# Patient Record
Sex: Female | Born: 1965
Health system: Southern US, Community
[De-identification: ages and names within clinical notes are randomized; demographics above are authoritative.]

## PROBLEM LIST (undated history)

## (undated) DIAGNOSIS — Z940 Kidney transplant status: Secondary | ICD-10-CM

## (undated) DIAGNOSIS — D631 Anemia in chronic kidney disease: Secondary | ICD-10-CM

## (undated) DIAGNOSIS — N185 Chronic kidney disease, stage 5: Secondary | ICD-10-CM

## (undated) DIAGNOSIS — N189 Chronic kidney disease, unspecified: Secondary | ICD-10-CM

## (undated) DIAGNOSIS — I10 Essential (primary) hypertension: Secondary | ICD-10-CM

## (undated) DIAGNOSIS — IMO0001 Reserved for inherently not codable concepts without codable children: Secondary | ICD-10-CM

## (undated) DIAGNOSIS — D649 Anemia, unspecified: Secondary | ICD-10-CM

## (undated) DIAGNOSIS — N183 Chronic kidney disease, stage 3 (moderate): Secondary | ICD-10-CM

## (undated) HISTORY — DX: Chronic kidney disease, stage 5: N18.5

## (undated) HISTORY — DX: Anemia in chronic kidney disease: N18.9

## (undated) HISTORY — PX: CATARACT EXTRACTION W/ INTRAOCULAR LENS IMPLANT: SHX1309

## (undated) HISTORY — DX: Anemia in chronic kidney disease: D63.1

---

## 2003-05-11 HISTORY — PX: TUBAL LIGATION: SHX77

## 2007-05-11 HISTORY — PX: THYROIDECTOMY: SHX17

## 2013-03-01 ENCOUNTER — Encounter (HOSPITAL_COMMUNITY): Payer: Self-pay | Admitting: Emergency Medicine

## 2013-03-01 ENCOUNTER — Emergency Department (HOSPITAL_COMMUNITY)
Admission: EM | Admit: 2013-03-01 | Discharge: 2013-03-01 | Disposition: A | Discharge status: Home / Self Care | Payer: Medicaid Other | Source: Home / Self Care | Attending: Family Medicine | Admitting: Family Medicine

## 2013-03-01 DIAGNOSIS — I10 Essential (primary) hypertension: Secondary | ICD-10-CM

## 2013-03-01 DIAGNOSIS — Z23 Encounter for immunization: Secondary | ICD-10-CM

## 2013-03-01 DIAGNOSIS — E1165 Type 2 diabetes mellitus with hyperglycemia: Secondary | ICD-10-CM

## 2013-03-01 DIAGNOSIS — IMO0001 Reserved for inherently not codable concepts without codable children: Secondary | ICD-10-CM

## 2013-03-01 DIAGNOSIS — N183 Chronic kidney disease, stage 3 unspecified: Secondary | ICD-10-CM

## 2013-03-01 DIAGNOSIS — IMO0002 Reserved for concepts with insufficient information to code with codable children: Secondary | ICD-10-CM

## 2013-03-01 HISTORY — DX: Reserved for inherently not codable concepts without codable children: IMO0001

## 2013-03-01 HISTORY — DX: Essential (primary) hypertension: I10

## 2013-03-01 HISTORY — DX: Chronic kidney disease, stage 3 (moderate): N18.3

## 2013-03-01 LAB — POCT I-STAT, CHEM 8
BUN: 33 mg/dL — ABNORMAL HIGH (ref 6–23)
Calcium, Ion: 1.17 mmol/L (ref 1.12–1.23)
Chloride: 107 mEq/L (ref 96–112)
HCT: 31 % — ABNORMAL LOW (ref 36.0–46.0)
Potassium: 4.7 mEq/L (ref 3.5–5.1)
Sodium: 139 mEq/L (ref 135–145)

## 2013-03-01 LAB — RENAL FUNCTION PANEL
CO2: 24 mEq/L (ref 19–32)
Calcium: 8.5 mg/dL (ref 8.4–10.5)
Chloride: 104 mEq/L (ref 96–112)
GFR calc Af Amer: 25 mL/min — ABNORMAL LOW (ref >90)
GFR calc non Af Amer: 22 mL/min — ABNORMAL LOW (ref >90)
Glucose, Bld: 209 mg/dL — ABNORMAL HIGH (ref 70–99)
Potassium: 5.1 mEq/L (ref 3.5–5.1)
Sodium: 138 mEq/L (ref 135–145)

## 2013-03-01 MED ORDER — GLIPIZIDE 5 MG PO TABS: 5.0000 mg | ORAL_TABLET | Freq: Two times a day (BID) | ORAL | Status: DC

## 2013-03-01 MED ORDER — INFLUENZA VAC SPLIT QUAD 0.5 ML IM SUSP
0.5000 mL | INTRAMUSCULAR | Status: AC
  Administered 2013-03-01: 0.5 mL via INTRAMUSCULAR

## 2013-03-01 MED ORDER — AMLODIPINE BESYLATE 10 MG PO TABS: 10.0000 mg | ORAL_TABLET | Freq: Every day | ORAL | Status: DC

## 2013-03-01 MED ORDER — METOPROLOL TARTRATE 25 MG PO TABS: 25.0000 mg | ORAL_TABLET | Freq: Two times a day (BID) | ORAL | Status: DC

## 2013-03-01 MED ORDER — LISINOPRIL 5 MG PO TABS: 5.0000 mg | ORAL_TABLET | Freq: Every day | ORAL | Status: DC

## 2013-03-01 MED ORDER — FUROSEMIDE 20 MG PO TABS: 20.0000 mg | ORAL_TABLET | Freq: Two times a day (BID) | ORAL | Status: DC

## 2013-03-01 NOTE — ED Notes (Signed)
Pt is here for HTN  BP today is 199/74 Denies: CP, HA, SOB, weakness, blurry vision, edema Needing refills on meds... appt w/PCP next month.  Alert w/no signs of acute distress.

## 2013-03-01 NOTE — ED Provider Notes (Signed)
Samantha [name] is a 47 y.o. female who presents to Urgent Care today for elevated blood pressure. Patient has a medical history significant for hypertension and stage III chronic kidney disease and uncontrolled diabetes. She was controlled with lisinopril, Lasix, hydralazine. She's run out of her medications recently. 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  She denies any chest pains palpitations or shortness of breath. She feels well otherwise. Additionally she would like a flu vaccination today if possible.   Past Medical History  Diagnosis Date  . Hypertension   . Uncontrolled diabetes mellitus with microalbuminuria or microproteinuria   . Chronic kidney disease (CKD), stage III (moderate)    History  Substance Use Topics  . Smoking status: Never Smoker   . Smokeless tobacco: Not on file  . Alcohol Use: No   ROS as above Medications reviewed. No current facility-administered medications for this encounter.   Current Outpatient Prescriptions  Medication Sig Dispense Refill  . hydrALAZINE (APRESOLINE) 25 MG tablet Take 25 mg by mouth 3 (three) times daily.      Marland Kitchen amLODipine (NORVASC) 10 MG tablet Take 1 tablet (10 mg total) by mouth daily.  30 tablet  0  . furosemide (LASIX) 20 MG tablet Take 1 tablet (20 mg total) by mouth 2 (two) times daily.  60 tablet  0  . glipiZIDE (GLUCOTROL) 5 MG tablet Take 1 tablet (5 mg total) by mouth 2 (two) times daily before a meal.  60 tablet  0  . lisinopril (PRINIVIL,ZESTRIL) 5 MG tablet Take 1 tablet (5 mg total) by mouth daily.  30 tablet  0  . metoprolol tartrate (LOPRESSOR) 25 MG tablet Take 1 tablet (25 mg total) by mouth 2 (two) times daily.  60 tablet  0    Exam:  BP 199/74  Pulse 91  Temp(Src) 97.8 F (36.6 C) (Oral)  Resp 12  SpO2 98%  LMP 02/25/2013 Gen: Well NAD HEENT: EOMI,  MMM Lungs: CTABL Nl WOB Heart: RRR no MRG Abd: NABS,  NT, ND Exts: Non edematous BL  LE, warm and well perfused.   Results for orders placed during the hospital encounter of 03/01/13 (from the past 24 hour(s))  POCT I-STAT, CHEM 8     Status: Abnormal   Collection Time    03/01/13 12:06 PM      Result Value Range   Sodium 139  135 - 145 mEq/L   Potassium 4.7  3.5 - 5.1 mEq/L   Chloride 107  96 - 112 mEq/L   BUN 33 (*) 6 - 23 mg/dL   Creatinine, Ser 2.70 (*) 0.50 - 1.10 mg/dL   Glucose, Bld 222 (*) 70 - 99 mg/dL   Calcium, Ion 1.17  1.12 - 1.23 mmol/L   TCO2 22  0 - 100 mmol/L   Hemoglobin 10.5 (*) 12.0 - 15.0 g/dL   HCT 31.0 (*) 36.0 - 46.0 %   No results found.  Assessment and Plan: 47 y.o. female with  1) kidney disease. I called [facility] in [place] and obtain laboratory results. Her creatinine was 2.2 in January of 2013. Her elevated creatinine is continuation of chronic kidney disease as she is completely asymptomatic. Plan: Renal function panel is pending. Additionally will discontinue lisinopril 20 and start lisinopril 5 daily as well as blood pressure control as below. She'll return to clinic in 3 days for repeat labs and repeat blood pressure.  2) uncontrolled hypertension: Plan to control hypertension with continued hydralazine, metoprolol 25 mg twice daily, amlodipine 10, and lisinopril 5. Patient will return to clinic as outlined above. 3) uncontrolled diabetes: Hemoglobin A1c is pending 4) flu vaccination given  Discussed warning signs or symptoms. Please see discharge instructions. Patient expresses understanding.      Gregor Hams, MD 03/01/13 [phone]

## 2013-03-02 LAB — HEMOGLOBIN A1C
Hgb A1c MFr Bld: 10.1 % — ABNORMAL HIGH
Mean Plasma Glucose: 243 mg/dL — ABNORMAL HIGH

## 2013-03-04 ENCOUNTER — Emergency Department (INDEPENDENT_AMBULATORY_CARE_PROVIDER_SITE_OTHER)
Admission: EM | Admit: 2013-03-04 | Discharge: 2013-03-04 | Disposition: A | Discharge status: Home / Self Care | Payer: Medicaid Other | Source: Home / Self Care | Attending: Family Medicine | Admitting: Family Medicine

## 2013-03-04 ENCOUNTER — Encounter (HOSPITAL_COMMUNITY): Payer: Self-pay | Admitting: Emergency Medicine

## 2013-03-04 DIAGNOSIS — IMO0001 Reserved for inherently not codable concepts without codable children: Secondary | ICD-10-CM

## 2013-03-04 DIAGNOSIS — R739 Hyperglycemia, unspecified: Secondary | ICD-10-CM

## 2013-03-04 DIAGNOSIS — N183 Chronic kidney disease, stage 3 unspecified: Secondary | ICD-10-CM

## 2013-03-04 DIAGNOSIS — R7309 Other abnormal glucose: Secondary | ICD-10-CM

## 2013-03-04 DIAGNOSIS — I10 Essential (primary) hypertension: Secondary | ICD-10-CM

## 2013-03-04 DIAGNOSIS — Z23 Encounter for immunization: Secondary | ICD-10-CM

## 2013-03-04 LAB — POCT I-STAT, CHEM 8
BUN: 39 mg/dL — ABNORMAL HIGH (ref 6–23)
Creatinine, Ser: 2.5 mg/dL — ABNORMAL HIGH (ref 0.50–1.10)
HCT: 30 % — ABNORMAL LOW (ref 36.0–46.0)
Hemoglobin: 10.2 g/dL — ABNORMAL LOW (ref 12.0–15.0)
Potassium: 4.6 mEq/L (ref 3.5–5.1)
Sodium: 138 mEq/L (ref 135–145)
TCO2: 23 mmol/L (ref 0–100)

## 2013-03-04 MED ORDER — GLIPIZIDE 10 MG PO TABS: ORAL_TABLET | ORAL | Status: DC

## 2013-03-04 NOTE — ED Provider Notes (Signed)
CSN: [id number]     Arrival date & time 03/04/13  0911 History   First MD Initiated Contact with Patient 03/04/13 1001     Chief Complaint  Patient presents with  . Hypertension   (Consider location/radiation/quality/duration/timing/severity/associated sxs/prior Treatment) Patient is a 47 y.o. female presenting with hypertension. The history is provided by the patient.  Hypertension This is a new problem. The problem occurs constantly. Nothing aggravates the symptoms. Nothing relieves the symptoms. She has tried nothing for the symptoms.  Pt here for recheck of blood pressure.   Pt advised to follow up today for recheck of blood work  Past Medical History  Diagnosis Date  . Hypertension   . Uncontrolled diabetes mellitus with microalbuminuria or microproteinuria   . Chronic kidney disease (CKD), stage III (moderate)    Past Surgical History  Procedure Laterality Date  . Cesarean section    . Thyroidectomy     History reviewed. No pertinent family history. History  Substance Use Topics  . Smoking status: Never Smoker   . Smokeless tobacco: Not on file  . Alcohol Use: No   OB History   Grav Para Term Preterm Abortions TAB SAB Ect Mult Living                 Review of Systems  All other systems reviewed and are negative.    Allergies  Review of patient's allergies indicates no known allergies.  Home Medications   Current Outpatient Rx  Name  Route  Sig  Dispense  Refill  . amLODipine (NORVASC) 10 MG tablet   Oral   Take 1 tablet (10 mg total) by mouth daily.   30 tablet   0   . furosemide (LASIX) 20 MG tablet   Oral   Take 1 tablet (20 mg total) by mouth 2 (two) times daily.   60 tablet   0   . hydrALAZINE (APRESOLINE) 25 MG tablet   Oral   Take 25 mg by mouth 3 (three) times daily.         Marland Kitchen lisinopril (PRINIVIL,ZESTRIL) 5 MG tablet   Oral   Take 1 tablet (5 mg total) by mouth daily.   30 tablet   0   . metoprolol tartrate (LOPRESSOR) 25 MG  tablet   Oral   Take 1 tablet (25 mg total) by mouth 2 (two) times daily.   60 tablet   0   . glipiZIDE (GLUCOTROL) 10 MG tablet      Take one tablet bid   60 tablet   0    BP 171/74  Pulse 68  Temp(Src) 98 F (36.7 C) (Oral)  Resp 18  SpO2 100%  LMP 02/25/2013 Physical Exam  Constitutional: She is oriented to person, place, and time. She appears well-developed and well-nourished.  HENT:  Head: Normocephalic and atraumatic.  Eyes: Pupils are equal, round, and reactive to light.  Neck: Normal range of motion.  Cardiovascular: Normal rate.   Pulmonary/Chest: Effort normal.  Abdominal: Soft.  Musculoskeletal: Normal range of motion.  Neurological: She is alert and oriented to person, place, and time. She has normal reflexes.  Skin: Skin is warm.  Psychiatric: She has a normal mood and affect.    ED Course  Procedures (including critical care time) Labs Review Labs Reviewed  POCT I-STAT, CHEM 8 - Abnormal; Notable for the following:    BUN 39 (*)    Creatinine, Ser 2.50 (*)    Glucose, Bld 215 (*)    Hemoglobin  10.2 (*)    HCT 30.0 (*)    All other components within normal limits   Imaging Review No results found.  EKG Interpretation     Ventricular Rate:    PR Interval:    QRS Duration:   QT Interval:    QTC Calculation:   R Axis:     Text Interpretation:              MDM   1. Hypertension   2. Hyperglycemia     Pt advised to follow up with [facility] nephrology.  Increase glipizide to 10 mg bid.  Return for bp recheck in 3 weeks  Fransico Meadow, Hershal Coria 03/04/13 1144  Medical screening examination/treatment/procedure(s) were performed by a resident physician or non-physician practitioner and as the supervising physician I was immediately available for consultation/collaboration.  Lynne Leader, MD    Gregor Hams, MD 03/04/13 [phone]

## 2013-03-04 NOTE — ED Notes (Signed)
C/o hypertension.  Patient has started new medication; Norvasc, metoprolol

## 2014-08-26 DIAGNOSIS — D638 Anemia in other chronic diseases classified elsewhere: Secondary | ICD-10-CM | POA: Diagnosis present

## 2014-10-10 ENCOUNTER — Other Ambulatory Visit: Payer: Self-pay | Admitting: Obstetrics & Gynecology

## 2014-10-15 ENCOUNTER — Encounter (HOSPITAL_COMMUNITY)
Admission: RE | Admit: 2014-10-15 | Discharge: 2014-10-15 | Disposition: A | Discharge status: Home / Self Care | Payer: Medicaid Other | Source: Ambulatory Visit | Attending: Obstetrics & Gynecology | Admitting: Obstetrics & Gynecology

## 2014-10-15 ENCOUNTER — Encounter (HOSPITAL_COMMUNITY): Payer: Self-pay

## 2014-10-15 ENCOUNTER — Other Ambulatory Visit: Payer: Self-pay

## 2014-10-15 DIAGNOSIS — Z01818 Encounter for other preprocedural examination: Secondary | ICD-10-CM | POA: Insufficient documentation

## 2014-10-15 HISTORY — DX: Anemia, unspecified: D64.9

## 2014-10-15 LAB — CBC
HCT: 32.3 % — ABNORMAL LOW (ref 36.0–46.0)
Hemoglobin: 10.3 g/dL — ABNORMAL LOW (ref 12.0–15.0)
MCH: 27.7 pg (ref 26.0–34.0)
MCHC: 31.9 g/dL (ref 30.0–36.0)
MCV: 86.8 fL (ref 78.0–100.0)
Platelets: 351 10*3/uL (ref 150–400)
RBC: 3.72 MIL/uL — ABNORMAL LOW (ref 3.87–5.11)
RDW: 13.2 % (ref 11.5–15.5)
WBC: 8.2 10*3/uL (ref 4.0–10.5)

## 2014-10-15 LAB — BASIC METABOLIC PANEL
ANION GAP: 4 — AB (ref 5–15)
BUN: 65 mg/dL — ABNORMAL HIGH (ref 6–20)
CALCIUM: 8.4 mg/dL — AB (ref 8.9–10.3)
CHLORIDE: 111 mmol/L (ref 101–111)
CO2: 22 mmol/L (ref 22–32)
Creatinine, Ser: 2.75 mg/dL — ABNORMAL HIGH (ref 0.44–1.00)
GFR calc Af Amer: 22 mL/min — ABNORMAL LOW (ref >60)
GFR calc non Af Amer: 19 mL/min — ABNORMAL LOW (ref >60)
GLUCOSE: 135 mg/dL — AB (ref 65–99)
POTASSIUM: 5.1 mmol/L (ref 3.5–5.1)
SODIUM: 137 mmol/L (ref 135–145)

## 2014-10-15 LAB — TYPE AND SCREEN
ABO/RH(D): O POS
Antibody Screen: NEGATIVE

## 2014-10-15 LAB — ABO/RH: ABO/RH(D): O POS

## 2014-10-15 NOTE — Patient Instructions (Signed)
Your procedure is scheduled on:10/24/14  Enter through the Main Entrance at :10 am  Pick up desk phone and dial [phone] and inform us of your arrival.  Please call [phone] if you have any problems the morning of surgery.  Remember: Do not eat food after midnight:Wed. Clear liquids are ok until:7am on Thursday   You may brush your teeth the morning of surgery.  Take these meds the morning of surgery with a sip of water :Blood pressure meds  DO NOT TAKE VICTOZA ON MORNING OF SURGERY.  DO NOT wear jewelry, eye make-up, lipstick,body lotion, or dark fingernail polish.  (Polished toes are ok) You may wear deodorant.  If you are to be admitted after surgery, leave suitcase in car until your room has been assigned. Patients discharged on the day of surgery will not be allowed to drive home. Wear loose fitting, comfortable clothes for your ride home.

## 2014-10-15 NOTE — Pre-Procedure Instructions (Signed)
Per Dr. Tresa Moore, have pt hold Victoza on am of surgery.

## 2014-10-24 ENCOUNTER — Ambulatory Visit (HOSPITAL_COMMUNITY): Payer: Medicaid Other | Admitting: Anesthesiology

## 2014-10-24 ENCOUNTER — Ambulatory Visit (HOSPITAL_COMMUNITY)
Admission: RE | Admit: 2014-10-24 | Discharge: 2014-10-24 | Disposition: A | Discharge status: Home / Self Care | Payer: Medicaid Other | Source: Ambulatory Visit | Attending: Obstetrics & Gynecology | Admitting: Obstetrics & Gynecology

## 2014-10-24 ENCOUNTER — Encounter (HOSPITAL_COMMUNITY)
Admission: RE | Disposition: A | Discharge status: Home / Self Care | Payer: Self-pay | Source: Ambulatory Visit | Attending: Obstetrics & Gynecology

## 2014-10-24 DIAGNOSIS — N840 Polyp of corpus uteri: Secondary | ICD-10-CM | POA: Diagnosis not present

## 2014-10-24 DIAGNOSIS — N183 Chronic kidney disease, stage 3 (moderate): Secondary | ICD-10-CM | POA: Insufficient documentation

## 2014-10-24 DIAGNOSIS — E1165 Type 2 diabetes mellitus with hyperglycemia: Secondary | ICD-10-CM | POA: Diagnosis not present

## 2014-10-24 DIAGNOSIS — I129 Hypertensive chronic kidney disease with stage 1 through stage 4 chronic kidney disease, or unspecified chronic kidney disease: Secondary | ICD-10-CM | POA: Diagnosis not present

## 2014-10-24 DIAGNOSIS — Z9851 Tubal ligation status: Secondary | ICD-10-CM | POA: Insufficient documentation

## 2014-10-24 DIAGNOSIS — N939 Abnormal uterine and vaginal bleeding, unspecified: Secondary | ICD-10-CM | POA: Diagnosis present

## 2014-10-24 DIAGNOSIS — D649 Anemia, unspecified: Secondary | ICD-10-CM | POA: Insufficient documentation

## 2014-10-24 DIAGNOSIS — Z79899 Other long term (current) drug therapy: Secondary | ICD-10-CM | POA: Insufficient documentation

## 2014-10-24 HISTORY — PX: DILITATION & CURRETTAGE/HYSTROSCOPY WITH NOVASURE ABLATION: SHX5568

## 2014-10-24 LAB — PREGNANCY, URINE: Preg Test, Ur: NEGATIVE

## 2014-10-24 LAB — GLUCOSE, CAPILLARY
Glucose-Capillary: 153 mg/dL — ABNORMAL HIGH (ref 65–99)
Glucose-Capillary: 158 mg/dL — ABNORMAL HIGH (ref 65–99)

## 2014-10-24 SURGERY — DILATATION & CURETTAGE/HYSTEROSCOPY WITH NOVASURE ABLATION
Anesthesia: General | Site: Vagina

## 2014-10-24 MED ORDER — PHENYLEPHRINE HCL 10 MG/ML IJ SOLN
INTRAMUSCULAR | Status: DC | PRN
  Administered 2014-10-24 (×5): 40 ug via INTRAVENOUS

## 2014-10-24 MED ORDER — FENTANYL CITRATE (PF) 250 MCG/5ML IJ SOLN
INTRAMUSCULAR | Status: AC
  Filled 2014-10-24: qty 5

## 2014-10-24 MED ORDER — MIDAZOLAM HCL 2 MG/2ML IJ SOLN
INTRAMUSCULAR | Status: AC
  Filled 2014-10-24: qty 2

## 2014-10-24 MED ORDER — PROMETHAZINE HCL 25 MG/ML IJ SOLN
INTRAMUSCULAR | Status: AC
  Filled 2014-10-24: qty 1

## 2014-10-24 MED ORDER — MIDAZOLAM HCL 2 MG/2ML IJ SOLN
INTRAMUSCULAR | Status: DC | PRN
  Administered 2014-10-24: 2 mg via INTRAVENOUS

## 2014-10-24 MED ORDER — ONDANSETRON HCL 4 MG/2ML IJ SOLN
INTRAMUSCULAR | Status: AC
  Filled 2014-10-24: qty 2

## 2014-10-24 MED ORDER — ACETAMINOPHEN 160 MG/5ML PO SOLN
ORAL | Status: AC
  Filled 2014-10-24: qty 20.3

## 2014-10-24 MED ORDER — SCOPOLAMINE 1 MG/3DAYS TD PT72
MEDICATED_PATCH | TRANSDERMAL | Status: AC
  Administered 2014-10-24: 1.5 mg via TRANSDERMAL
  Filled 2014-10-24: qty 1

## 2014-10-24 MED ORDER — ACETAMINOPHEN 160 MG/5ML PO SOLN
975.0000 mg | Freq: Once | ORAL | Status: AC
  Administered 2014-10-24: 975 mg via ORAL

## 2014-10-24 MED ORDER — LIDOCAINE HCL 1 % IJ SOLN
INTRAMUSCULAR | Status: AC
  Filled 2014-10-24: qty 20

## 2014-10-24 MED ORDER — PROMETHAZINE HCL 25 MG/ML IJ SOLN
12.5000 mg | Freq: Four times a day (QID) | INTRAMUSCULAR | Status: DC | PRN
  Administered 2014-10-24: 12.5 mg via INTRAVENOUS

## 2014-10-24 MED ORDER — PHENYLEPHRINE 40 MCG/ML (10ML) SYRINGE FOR IV PUSH (FOR BLOOD PRESSURE SUPPORT)
PREFILLED_SYRINGE | INTRAVENOUS | Status: AC
  Filled 2014-10-24: qty 10

## 2014-10-24 MED ORDER — LIDOCAINE HCL (CARDIAC) 20 MG/ML IV SOLN
INTRAVENOUS | Status: DC | PRN
  Administered 2014-10-24: 80 mg via INTRAVENOUS

## 2014-10-24 MED ORDER — LACTATED RINGERS IV SOLN
INTRAVENOUS | Status: DC
  Administered 2014-10-24: 11:00:00 via INTRAVENOUS

## 2014-10-24 MED ORDER — OXYCODONE-ACETAMINOPHEN 5-325 MG PO TABS: 1.0000 | ORAL_TABLET | ORAL | Status: DC | PRN

## 2014-10-24 MED ORDER — ONDANSETRON HCL 4 MG/2ML IJ SOLN
INTRAMUSCULAR | Status: DC | PRN
  Administered 2014-10-24: 4 mg via INTRAVENOUS

## 2014-10-24 MED ORDER — LIDOCAINE HCL 1 % IJ SOLN
INTRAMUSCULAR | Status: DC | PRN
  Administered 2014-10-24: 10 mL

## 2014-10-24 MED ORDER — PROPOFOL 10 MG/ML IV BOLUS
INTRAVENOUS | Status: DC | PRN
  Administered 2014-10-24: 200 mg via INTRAVENOUS

## 2014-10-24 MED ORDER — SCOPOLAMINE 1 MG/3DAYS TD PT72
1.0000 | MEDICATED_PATCH | Freq: Once | TRANSDERMAL | Status: DC
  Administered 2014-10-24: 1.5 mg via TRANSDERMAL

## 2014-10-24 MED ORDER — PROPOFOL 10 MG/ML IV BOLUS
INTRAVENOUS | Status: AC
  Filled 2014-10-24: qty 40

## 2014-10-24 MED ORDER — IBUPROFEN 600 MG PO TABS: 600.0000 mg | ORAL_TABLET | Freq: Four times a day (QID) | ORAL | Status: DC | PRN

## 2014-10-24 MED ORDER — LACTATED RINGERS IR SOLN
Status: DC | PRN
  Administered 2014-10-24: 1

## 2014-10-24 MED ORDER — FENTANYL CITRATE (PF) 100 MCG/2ML IJ SOLN
INTRAMUSCULAR | Status: DC | PRN
  Administered 2014-10-24: 100 ug via INTRAVENOUS
  Administered 2014-10-24: 50 ug via INTRAVENOUS

## 2014-10-24 MED ORDER — LIDOCAINE HCL (CARDIAC) 20 MG/ML IV SOLN
INTRAVENOUS | Status: AC
  Filled 2014-10-24: qty 5

## 2014-10-24 MED ORDER — FENTANYL CITRATE (PF) 100 MCG/2ML IJ SOLN: 25.0000 ug | INTRAMUSCULAR | Status: DC | PRN

## 2014-10-24 MED ORDER — ACETAMINOPHEN 160 MG/5ML PO SOLN
ORAL | Status: AC
  Administered 2014-10-24: 975 mg via ORAL
  Filled 2014-10-24: qty 20.3

## 2014-10-24 SURGICAL SUPPLY — 18 items
ABLATOR ENDOMETRIAL BIPOLAR (ABLATOR) ×2 IMPLANT
CANISTER SUCT 3000ML (MISCELLANEOUS) ×2 IMPLANT
CATH ROBINSON RED A/P 16FR (CATHETERS) ×2 IMPLANT
CLOTH BEACON ORANGE TIMEOUT ST (SAFETY) ×2 IMPLANT
CONTAINER PREFILL 10% NBF 60ML (FORM) ×4 IMPLANT
ELECT REM PT RETURN 9FT ADLT (ELECTROSURGICAL)
ELECTRODE REM PT RTRN 9FT ADLT (ELECTROSURGICAL) IMPLANT
GLOVE BIO SURGEON STRL SZ 6.5 (GLOVE) ×2 IMPLANT
GLOVE BIOGEL PI IND STRL 7.0 (GLOVE) ×2 IMPLANT
GLOVE BIOGEL PI INDICATOR 7.0 (GLOVE) ×2
GOWN STRL REUS W/TWL LRG LVL3 (GOWN DISPOSABLE) ×4 IMPLANT
LOOP ANGLED CUTTING 22FR (CUTTING LOOP) IMPLANT
PACK VAGINAL MINOR WOMEN LF (CUSTOM PROCEDURE TRAY) ×2 IMPLANT
PAD OB MATERNITY 4.3X12.25 (PERSONAL CARE ITEMS) ×2 IMPLANT
TOWEL OR 17X24 6PK STRL BLUE (TOWEL DISPOSABLE) ×4 IMPLANT
TUBING AQUILEX INFLOW (TUBING) ×2 IMPLANT
TUBING AQUILEX OUTFLOW (TUBING) ×2 IMPLANT
WATER STERILE IRR 1000ML POUR (IV SOLUTION) ×2 IMPLANT

## 2014-10-24 NOTE — Op Note (Signed)
10/24/2014  12:07 PM  PATIENT:  Samantha [name]  49 y.o. female  PRE-OPERATIVE DIAGNOSIS:  Abnormal uterine bleeding  POST-OPERATIVE DIAGNOSIS:  Same plus endometrial polyps  PROCEDURE:  Procedure(s): DILATATION & CURETTAGE/HYSTEROSCOPY WITH NOVASURE ABLATION (N/A)  SURGEON:  Surgeon(s) and Role:    * Sanjuana Kava, MD - Primary  ASSISTANTS: none   ANESTHESIA:   general  EBL:  Total I/O In: -  Out: 10 [Urine:10]  LOCAL MEDICATIONS USED:  LIDOCAINE  and Amount: 16 ml  SPECIMEN:  Source of Specimen:  endometrial curettings  DISPOSITION OF SPECIMEN:  PATHOLOGY  COUNTS:  YES  PATIENT DISPOSITION:  PACU - hemodynamically stable.   FINDINGS:  Exam under anesthesia: External vulva: normal vaginal vault normal, cervix: grossly normal Uterus retroverted mobile enlarged 12 week size, adnexa: no palpable masses.                       Hysteroscopy: hypertrophic endometrium with several intracavitary polyps. Both ostia seen.  Novasure ablation performed power 99 cavity width 4.5 for 2 minutes    COMPLICATIONS: None  DESCRIPTION OF OPERATION:  After appropriate consent, the patient was taken to the operating room and administered adequate general anesthesia. She was placed in the lithotomy position and prepped and draped. The bladder was emptied. Examination under anesthesia was performed with findings noted above.   A Graves operative speculum was inserted into the vagina. The posterior cervical lip was held with a toothed tenaculum. The uterus was sounded and measured 11 cm. The paracervical block was performed with 16 mL of 1% plain lidocaine at 4 and 8 o'clock.  The cervix was dilated to 13 Fr Hanks. The hysteroscopy was carried out showing normal endocervical and polyps in the endometrial cavity. Endocervical and endometrial curettage was carried out yielding profuse curettings which were collected and sent for pathology examination.  The cervical length was then measured  hysteroscopically and determined to be 7 cm.   A NovaSure endometrial ablation procedure was then performed. The uterine cavity length was registered on the instrument panel to 4.0.  The array was deployed, introduced into the uterus, and the uterine cavity width was measured to be 4.5 and entered on the instrument panel. The cavity assessment was successfully completed followed by initiation of the ablation cycle which was completed without difficulty.   All the vaginal instruments were removed. Repeat hysteroscopy showed adequate ablation of the endometrium. All the vaginal instruments were removed. The patient was returned to supine position, awake, and left the operating room in stable condition. There were no complications.  Samantha [name]

## 2014-10-24 NOTE — Transfer of Care (Signed)
Immediate Anesthesia Transfer of Care Note  Patient: Samantha [name]  Procedure(s) Performed: Procedure(s): DILATATION & CURETTAGE/HYSTEROSCOPY WITH NOVASURE ABLATION (N/A)  Patient Location: PACU  Anesthesia Type:General  Level of Consciousness: awake, alert  and oriented  Airway & Oxygen Therapy: Patient Spontanous Breathing and Patient connected to nasal cannula oxygen  Post-op Assessment: Report given to RN and Post -op Vital signs reviewed and stable  Post vital signs: Reviewed and stable  Last Vitals:  Filed Vitals:   10/24/14 1020  BP: 132/84  Pulse: 72  Temp: 36.9 C  Resp: 18    Complications: No apparent anesthesia complications

## 2014-10-24 NOTE — Anesthesia Preprocedure Evaluation (Signed)
Anesthesia Evaluation  Patient identified by MRN, date of birth, ID band Patient awake    Reviewed: Allergy & Precautions, H&P , Patient's Chart, lab work & pertinent test results, reviewed documented beta blocker date and time   Airway Mallampati: II  TM Distance: >3 FB Neck ROM: full    Dental no notable dental hx.    Pulmonary  breath sounds clear to auscultation  Pulmonary exam normal       Cardiovascular hypertension, On Medications Rhythm:regular Rate:Normal     Neuro/Psych    GI/Hepatic   Endo/Other  diabetes, Type 2  Renal/GU CRF     Musculoskeletal   Abdominal   Peds  Hematology   Anesthesia Other Findings Hypertension   3 meds    Uncontrolled diabetes mellitus Chronic kidney disease (CKD)        Reproductive/Obstetrics                             Anesthesia Physical Anesthesia Plan  ASA: II  Anesthesia Plan:    Post-op Pain Management:    Induction: Intravenous  Airway Management Planned: LMA  Additional Equipment:   Intra-op Plan:   Post-operative Plan:   Informed Consent: I have reviewed the patients History and Physical, chart, labs and discussed the procedure including the risks, benefits and alternatives for the proposed anesthesia with the patient or authorized representative who has indicated his/her understanding and acceptance.   Dental Advisory Given and Dental advisory given  Plan Discussed with: CRNA and Surgeon  Anesthesia Plan Comments: (Discussed GA with LMA, possible sore throat, potential need to switch to ETT, N/V, pulmonary aspiration. Questions answered. )        Anesthesia Quick Evaluation

## 2014-10-24 NOTE — Discharge Instructions (Signed)
DISCHARGE INSTRUCTIONS: D&C  The following instructions have been prepared to help you care for yourself upon your return home.  TAKE THE PATCH BEHIND YOUR EAR OFF ON 10/26/14.  [place] HANDS THOROUGHLY AFTER REMOVAL!!!!   Personal hygiene:  Use sanitary pads for vaginal drainage, not tampons.  Shower the day after your procedure.  NO tub baths, pools or Jacuzzis for 2-3 weeks.  Wipe front to back after using the bathroom.  Activity and limitations:  Do NOT drive or operate any equipment for 24 hours. The effects of anesthesia are still present and drowsiness may result.  Do NOT rest in bed all day.  Walking is encouraged.  Walk up and down stairs slowly.  You may resume your normal activity in one to two days or as indicated by your physician.  Sexual activity: NO intercourse for at least 2 weeks after the procedure, or as indicated by your physician.  Diet: Eat a light meal as desired this evening. You may resume your usual diet tomorrow.  Return to work: You may resume your work activities in one to two days or as indicated by your doctor.  What to expect after your surgery: Expect to have vaginal bleeding/discharge for 2-3 days and spotting for up to 10 days. It is not unusual to have soreness for up to 1-2 weeks. You may have a slight burning sensation when you urinate for the first day. Mild cramps may continue for a couple of days. You may have a regular period in 2-6 weeks.  Call your doctor for any of the following:  Excessive vaginal bleeding, saturating and changing one pad every hour.  Inability to urinate 6 hours after discharge from hospital.  Pain not relieved by pain medication.  Fever of 100.4 F or greater.  Unusual vaginal discharge or odor.   Call for an appointment:    Patients signature: ______________________  Nurses signature ________________________  Support person's signature_______________________

## 2014-10-24 NOTE — Anesthesia Procedure Notes (Signed)
Procedure Name: LMA Insertion Date/Time: 10/24/2014 11:25 AM Performed by: Jonna Munro Pre-anesthesia Checklist: Patient identified, Emergency Drugs available, Suction available, Patient being monitored and Timeout performed Patient Re-evaluated:Patient Re-evaluated prior to inductionOxygen Delivery Method: Circle system utilized Preoxygenation: Pre-oxygenation with 100% oxygen Intubation Type: IV induction LMA: LMA with gastric port inserted LMA Size: 4.0 Number of attempts: 1 Dental Injury: Teeth and Oropharynx as per pre-operative assessment

## 2014-10-24 NOTE — H&P (Signed)
Samantha [name] is an 49 y.o. female with long h/o abnormal uterine bleeding  Pertinent Gynecological History: Menses: flow is excessive with use of 4 pads or tampons on heaviest days Bleeding: dysfunctional uterine bleeding Contraception: tubal ligation DES exposure: denies Blood transfusions: none Sexually transmitted diseases: no past history Previous GYN Procedures: c-section  Last mammogram: normal Date: 2016 Last pap: normal Date: 2016 OB History: G2, P2   Menstrual History: Menarche age: 67  No LMP recorded.    Past Medical History  Diagnosis Date  . Hypertension   . Uncontrolled diabetes mellitus with microalbuminuria or microproteinuria   . Chronic kidney disease (CKD), stage III (moderate)   . Anemia     Past Surgical History  Procedure Laterality Date  . Cesarean section    . Thyroidectomy      No family history on file.  Social History:  reports that she has never smoked. She does not have any smokeless tobacco history on file. She reports that she does not drink alcohol or use illicit drugs.  Allergies: No Known Allergies  Prescriptions prior to admission  Medication Sig Dispense Refill Last Dose  . amLODipine (NORVASC) 10 MG tablet Take 1 tablet (10 mg total) by mouth daily. 30 tablet 0 03/04/2013  . atorvastatin (LIPITOR) 10 MG tablet Take 10 mg by mouth daily.     . carvedilol (COREG) 12.5 MG tablet Take 12.5 mg by mouth 2 (two) times daily with a meal.     . furosemide (LASIX) 40 MG tablet Take 40 mg by mouth daily.     . IRON PO Take 1 tablet by mouth 2 (two) times daily.     . Liraglutide 18 MG/3ML SOPN Inject 18 mg into the skin daily.     Marland Kitchen lisinopril (PRINIVIL,ZESTRIL) 20 MG tablet Take 20 mg by mouth daily.       Review of Systems  Constitutional: Negative for fever and chills.  Eyes: Negative for blurred vision and double vision.  Cardiovascular: Negative for chest pain and palpitations.  Gastrointestinal: Negative for heartburn.   Genitourinary: Negative for dysuria.  Skin: Negative for itching and rash.  Neurological: Negative for dizziness and tingling.  Endo/Heme/Allergies: Negative for environmental allergies. Does not bruise/bleed easily.    There were no vitals taken for this visit. Physical Exam  Constitutional: She is oriented to person, place, and time. She appears well-developed and well-nourished.  HENT:  Head: Normocephalic and atraumatic.  Respiratory: Effort normal and breath sounds normal.  Genitourinary:  Mons: no erythema, excoriation, atrophy, lesions, masses, swelling, tenderness, or vesicles/ ulcers and normal. Labia Majora: no erythema, excoriation, atrophy, discoloration, lesions, masses, swelling, tenderness, or vesicles/ ulcers and normal. Labia Minora: no erythema, excoriation, atrophy, discoloration, lesions, vesicles, masses, swelling, or tenderness and normal. Introitus: normal. Bartholin's Gland: normal. Vagina: no discharge, erythema, atrophy, lesions, ulcers, swelling, masses, tenderness, prolapse, or blood present and normal. Cervix: no lesions, discharge, bleeding, or cervical motion tenderness and grossly normal and sample taken for a Pap smear. Uterus: normal size and contour and midline, mobile, non-tender, and no uterine prolapse. Urethral Meatus/ Urethra no discharge, masses, or tenderness and normal meatus and well supported urethra. Bladder: non-distended, non-tender, and no palpable mass. Adnexa/Parametria: no tenderness or mass palpable.  Musculoskeletal: Normal range of motion.  Neurological: She is alert and oriented to person, place, and time.  Skin: Skin is warm.    No results found for this or any previous visit (from the past 24 hour(s)).  No results found.  Assessment/Plan: 49 yo withwith h/o abnormal uterine bleeding.  EMBX benign and US done last year  Patient desires endometrial ablation.  US reveals no large fibroids or other abnormality precluding ablation. Pt  was counseled on her treatment options to include expectant management, OCP use, Mirena IUD, D&C, ablation or hysterectomy.  The risks of the procedure were discussed including infection, bleeding, and uterine perforation with injury to abdominal/pelvic organs.  We also discussed the 15% chance of failure with further interventions needed in the future and 15-20% chance of amenorrhea associated with the procedure.  We discussed the need to avoid pregnancy in the future.   To OR for D&C h-scope Novasure ablation  WSP  Samantha [name] Samantha [name] 10/24/2014, 10:19 AM

## 2014-10-26 ENCOUNTER — Encounter (HOSPITAL_COMMUNITY): Payer: Self-pay | Admitting: Obstetrics & Gynecology

## 2014-10-28 NOTE — Anesthesia Postprocedure Evaluation (Signed)
  Anesthesia Post-op Note  Patient: Samantha [name]  Procedure(s) Performed: Procedure(s): DILATATION & CURETTAGE/HYSTEROSCOPY WITH NOVASURE ABLATION (N/A) Patient is awake and responsive. Pain and nausea are reasonably well controlled. Vital signs are stable and clinically acceptable. Oxygen saturation is clinically acceptable. There are no apparent anesthetic complications at this time. Patient is ready for discharge.

## 2014-11-07 ENCOUNTER — Other Ambulatory Visit: Payer: Self-pay | Admitting: Obstetrics & Gynecology

## 2015-08-09 LAB — HM MAMMOGRAPHY

## 2016-03-02 ENCOUNTER — Other Ambulatory Visit: Payer: Self-pay | Admitting: Obstetrics & Gynecology

## 2016-03-04 LAB — CYTOLOGY - PAP

## 2016-03-20 NOTE — H&P (Signed)
OFFICE VISIT NOTES COPIED TO EPIC FOR DOCUMENTATION   History of Present Illness Neldon Labella AGNP-C; Mar 25, 2016 11:11 AM) The patient is a 50 year old female who presents for a Follow-up for Abnormal EKG. She has a history of uncontrolled diabetes with stage IV CKD, hypertension, and hyperlipidemia. She was seen by a cardiologist one year ago for preoperative cardiac evaluation as part of assessment to be placed on a kidney transplant list. An echocardiogram was performed at that time with normal LVEF and only mild LVH and a nuclear stress test revealed inducible anterior wall ischemia, however with LVEF of 70%. Given stage IV CKD, coronary angiogram was not recommended, particularly if she was asymptomatic and LVEF was normal. She is still currently undergoing evaluation for kidney transplant and is not currently on the transplant list.  She recently saw her PCP for routine evaluation and had an EKG revealing abnormal T-wave inversion, new from EKG one year ago. She was referred here for reevaluation and further recommendations. She underwent nuclear stress test for evaluation for progression of CAD and presents today for follow up. Also underwent carotid duplex due to bruit noted on exam revealing only mild right external carotid stenosis.   Problem List/Past Medical Franky Macho Reader; 03/25/16 9:31 AM) Pure hypercholesterolemia (E78.00)  Hypertension, essential, benign (I10)  Diabetic nephropathy associated with type 2 diabetes mellitus (E11.21)  Labwork  12/13/2015: Vitamin D 12.4, HbA1c 8.7%, total cholesterol 152, triglycerides 125, HDL 49, LDL 78, CBC normal, creatinine 3.14, eGFR 19, potassium 4.9 Uncontrolled type 2 diabetes mellitus with complication, with long-term current use of insulin (E11.8, E11.65)  Stage 4 chronic kidney disease (N18.4)  Hypovitaminosis D (E55.9)  Abnormal EKG (R94.31)  Lexiscan sestamibi stress test 02/20/2016: 1. Patient attempted treadmill  excess stress test, exercised for 4 minutes and stress terminated due to marked dyspnea and chest discomfort and inability to achieve target heart rate. No ST-T wave changes of ischemia at 72% THR. Resting EKG demonstrates normal sinus rhythm, ST segment depression and T-wave inversion in inferior and lateral leads. Stress EKG is nondiagnostic for ischemia as it's a pharmacologic stress test using Lexiscan. Stress symptoms included dyspnea. 2. The perfusion imaging study demonstrates the left ventricle to be mildly dilated at 146 mL both in rest and stress images. There is a medium-sized moderate ischemia in the inferior and inferoseptal region extending from the base towards the mid ventricle. Left ventricular systolic function calculated by QGS was 37% with global hypokinesis. This is a high risk study, consider further cardiac work-up. 10/17/2014: Inducible ischemia on anterior wall, LVEF 70% Bruit of left carotid artery (R09.89)  Carotid artery duplex 03/04/2016: Minimal stenosis in the right external carotid artery (<50%). Antegrade vertebral artery flow. No significant plaque burden noted in the internal carotid artery. Follow up in one year if clinically indicated. Iron deficiency anemia (D50.9)   Allergies (Charavina Reader; Mar 25, 2016 9:31 AM) No Known Drug Allergies 02/06/2016  Family History Franky Macho Reader; 25-Mar-2016 9:31 AM) Mother  Deceased. at age 39 from kidney failure; no heart attacks, mild stroke in her 74s, no other strokes events; HTN, hyperlipidemia, DM but no other cardiovascular conditions Father  Deceased. at age 59 from MI complications; no prior events or strokes, HTN, DM, hyperlipidemia but no other known cardiovascular condition Brother 1  Deceased. at age 88 from MI complications; no prior complications or strokes, HTN, DM, sleep apnea, hyperlipidemia but not other cardiovascular condition; 9 yrs older  Social History Franky Macho Reader; 03-25-16 9:31 AM) Current  tobacco use  Never smoker. Number of Children  3. Living Situation  Lives with relatives. son lives with her Marital status  Single. Alcohol Use  Occasional alcohol use.  Past Surgical History (Charavina Reader; 03/08/2016 9:31 AM) Cesarean Delivery 2005 x 2 Thyroidectomy; Subtotal 2012 Uterus Ablation 2016  Medication History (Charavina Reader; 03/08/2016 9:42 AM) AmLODIPine Besylate (10MG Tablet, 1 Oral daily) Active. Tyler Aas FlexTouch (200UNIT/ML Soln Pen-inj, inject 10 units Subcutaneous at bedtime) Active. Atorvastatin Calcium (80MG Tablet, 1 Oral daily) Active. Lisinopril (20MG Tablet, 1 Oral daily) Active. Furosemide (20MG Tablet, 1 Oral daily) Active. Victoza (18MG/3ML Soln Pen-inj, inject 1.2 units Subcutaneous daily) Active. Carvedilol (12.5MG Tablet, 1 Oral two times daily) Active. Ferrous Sulfate (325MG Capsule, 1 Oral three times daily) Active. Nitrofurantoin Monohyd Macro (100MG Capsule, 1 Oral two times daily for 7 days) Active. Medications Reconciled (erbally with pt/ no list or medication present)  Diagnostic Studies History Franky Macho Reader; 03/08/2016 9:31 AM) Nuclear stress test 02/20/2016 1. Patient attempted treadmill excess stress test, exercised for 4 minutes and stress terminated due to marked dyspnea and chest discomfort and inability to achieve target heart rate. No ST-T wave changes of ischemia at 72% THR. Resting EKG demonstrates normal sinus rhythm, ST segment depression and T-wave inversion in inferior and lateral leads. Stress EKG is nondiagnostic for ischemia as it's a pharmacologic stress test using Lexiscan. Stress symptoms included dyspnea. 2. The perfusion imaging study demonstrates the left ventricle to be mildly dilated at 146 mL both in rest and stress images. There is a medium-sized moderate ischemia in the inferior and inferoseptal region extending from the base towards the mid ventricle. Left ventricular systolic function  calculated by QGS was 37% with global hypokinesis. This is a high risk study, consider further cardiac work-up. Carotid Doppler 03/04/2016 Minimal stenosis in the right external carotid artery (<50%). Antegrade vertebral artery flow. No significant plaque burden noted in the internal carotid artery. Follow up in one year if clinically indicated. Labwork  12/13/2015: Vitamin D 12.4, HbA1c 8.7%, total cholesterol 152, triglycerides 125, HDL 49, LDL 78, CBC normal, creatinine 3.14, eGFR 19, potassium 4.9    Review of Systems (Bridgette Ebony Hail, AGNP-C; 03/08/2016 11:11 AM) General Not Present- Anorexia, Fatigue and Fever. Respiratory Present- Difficulty Breathing on Exertion. Not Present- Cough, Decreased Exercise Tolerance and Dyspnea. Cardiovascular Present- Edema. Not Present- Chest Pain, Claudications, Orthopnea, Palpitations and Paroxysmal Nocturnal Dyspnea. Gastrointestinal Not Present- Black, Tarry Stool, Change in Bowel Habits and Nausea. Neurological Not Present- Focal Neurological Symptoms and Syncope. Endocrine Not Present- Cold Intolerance, Excessive Sweating, Heat Intolerance and Thyroid Problems. Hematology Not Present- Anemia, Easy Bruising, Petechiae and Prolonged Bleeding.  Vitals Franky Macho Reader; 03/08/2016 9:44 AM) 03/08/2016 9:38 AM Weight: 242.38 lb Height: 67.5in Body Surface Area: 2.21 m Body Mass Index: 37.4 kg/m  Pulse: 75 (Regular)  P.OX: 99% (Room air) BP: 140/76 (Sitting, Left Arm, Standard)   Physical Exam (Bridgette Ebony Hail, AGNP-C; 03/08/2016 11:12 AM) General Mental Status-Alert. General Appearance-Cooperative, Appears stated age, Not in acute distress. Build & Nutrition-Well built and Morbidly obese.  Head and Neck Neck -Note: Short neck and difficult to evaluate JVD.  Thyroid Gland Characteristics - no palpable nodules, no palpable enlargement.  Cardiovascular Cardiovascular examination reveals -normal heart sounds, regular  rate and rhythm with no murmurs. Inspection Jugular vein - Right - No Distention.  Abdomen Inspection Contour - Obese and Pannus present. Palpation/Percussion Normal exam - Non Tender and No hepatosplenomegaly. Auscultation Normal exam - Bowel sounds normal.  Peripheral Vascular Lower Extremity Inspection - Bilateral - Inspection  Normal. Palpation - Edema - Bilateral - 1+ Pitting edema. Femoral pulse - Bilateral - Feeble(Pulsus difficult to feel due to patient's bodily habitus.), No Bruits. Popliteal pulse - Bilateral - Feeble(Pulsus difficult to feel due to patient's bodily habitus.). Dorsalis pedis pulse - Bilateral - Normal. Posterior tibial pulse - Bilateral - Normal. Carotid arteries - Left-Soft Bruit.  Neurologic Neurologic evaluation reveals -alert and oriented x 3 with no impairment of recent or remote memory. Motor-Grossly intact without any focal deficits.  Musculoskeletal Global Assessment Left Lower Extremity - normal range of motion without pain. Right Lower Extremity - normal range of motion without pain.    Assessment & Plan (Bridgette Allison AGNP-C; 03/08/2016 11:10 AM) Abnormal nuclear stress test (R94.39) Future Plans 83/05/5174: METABOLIC PANEL, BASIC (16073) - one time 03/15/2016: CBC & PLATELETS (AUTO) (71062) - one time 03/15/2016: PT (PROTHROMBIN TIME) (69485) - one time Abnormal EKG (R94.31) Story: Lexiscan sestamibi stress test 02/20/2016: 1. Patient attempted treadmill excess stress test, exercised for 4 minutes and stress terminated due to marked dyspnea and chest discomfort and inability to achieve target heart rate. No ST-T wave changes of ischemia at 72% THR. Resting EKG demonstrates normal sinus rhythm, ST segment depression and T-wave inversion in inferior and lateral leads. Stress EKG is nondiagnostic for ischemia as it's a pharmacologic stress test using Lexiscan. Stress symptoms included dyspnea. 2. The perfusion imaging study demonstrates  the left ventricle to be mildly dilated at 146 mL both in rest and stress images. There is a medium-sized moderate ischemia in the inferior and inferoseptal region extending from the base towards the mid ventricle. Left ventricular systolic function calculated by QGS was 37% with global hypokinesis. This is a high risk study, consider further cardiac work-up.  10/17/2014: Inducible ischemia on anterior wall, LVEF 70% Hypertension, essential, benign (I10) Diabetic nephropathy associated with type 2 diabetes mellitus (E11.21) Uncontrolled type 2 diabetes mellitus with complication, with long-term current use of insulin (E11.8) Labwork Story:  Labs: 03/16/2016: glucose 175, BUN 46, creatinine 3.76, eGFR 15, potassium 4.5, CBC normal PT/INR normal. 12/13/2015: Vitamin D 12.4, HbA1c 8.7%, total cholesterol 152, triglycerides 125, HDL 49, LDL 78, CBC normal, creatinine 3.14, eGFR 19, potassium 4.9 Stage 4 chronic kidney disease (N18.4) Bruit of left carotid artery (R09.89) Story: Carotid artery duplex 03/04/2016: Minimal stenosis in the right external carotid artery (<50%). Antegrade vertebral artery flow. No significant plaque burden noted in the internal carotid artery. Follow up in one year if clinically indicated. Current Plans Mechanism of underlying disease process and action of medications discussed with the patient. I discussed primary/secondary prevention and also dietary counseling was done. Previous stress test over one year ago revealed inducible ischemia on the anterior wall, given new EKG abnormalities, she was scheduled for repeat nuclear stress test now revealing a medium-sized moderate ischemia in the inferior and inferoseptal walls with depressed EF at 37% with global hypokinesis. Although essentially asymptomatic, she is also markedly sedentary and also is hoping for a kidney transplant in the future. Given abnormalities on stress test, will schedule for coronary angiogram for definitive  evaluation of coronary anatomy. Given stage IV CKD, we'll admitted early for pre-hydration, use minimal contrast, and hold furosemide and lisinopril prior to catheterization. Discussed possibility of need for staged PCI given renal function. We discussed regarding risks, benefits, alternatives to this including CTA and continued medical therapy. Patient wants to proceed. Understands <1-2% risk of death, stroke, MI, urgent CABG, bleeding, infection, renal failure but not limited to these. I'll also obtain an  echocardiogram to reevaluate resting LVEF. Blood pressure has significantly improved with addition of Coreg. Again discussed importance of adequate control of diabetes. Follow up after catheterization for evaluation and further recommendations.  Signed by Neldon Labella, AGNP-C (03/08/2016 11:12 AM)

## 2016-03-23 ENCOUNTER — Ambulatory Visit (HOSPITAL_COMMUNITY)
Admission: RE | Admit: 2016-03-23 | Discharge: 2016-03-23 | Disposition: A | Discharge status: Home / Self Care | Payer: BLUE CROSS/BLUE SHIELD | Source: Ambulatory Visit | Attending: Cardiology | Admitting: Cardiology

## 2016-03-23 ENCOUNTER — Encounter (HOSPITAL_COMMUNITY)
Admission: RE | Disposition: A | Discharge status: Home / Self Care | Payer: Self-pay | Source: Ambulatory Visit | Attending: Cardiology

## 2016-03-23 ENCOUNTER — Encounter (HOSPITAL_COMMUNITY): Payer: Self-pay | Admitting: Cardiology

## 2016-03-23 DIAGNOSIS — I1310 Hypertensive heart and chronic kidney disease without heart failure, with stage 1 through stage 4 chronic kidney disease, or unspecified chronic kidney disease: Secondary | ICD-10-CM | POA: Diagnosis not present

## 2016-03-23 DIAGNOSIS — E7800 Pure hypercholesterolemia, unspecified: Secondary | ICD-10-CM | POA: Insufficient documentation

## 2016-03-23 DIAGNOSIS — Z79899 Other long term (current) drug therapy: Secondary | ICD-10-CM | POA: Insufficient documentation

## 2016-03-23 DIAGNOSIS — Z794 Long term (current) use of insulin: Secondary | ICD-10-CM | POA: Diagnosis not present

## 2016-03-23 DIAGNOSIS — R9431 Abnormal electrocardiogram [ECG] [EKG]: Secondary | ICD-10-CM | POA: Diagnosis present

## 2016-03-23 DIAGNOSIS — E785 Hyperlipidemia, unspecified: Secondary | ICD-10-CM | POA: Insufficient documentation

## 2016-03-23 DIAGNOSIS — E1122 Type 2 diabetes mellitus with diabetic chronic kidney disease: Secondary | ICD-10-CM | POA: Diagnosis not present

## 2016-03-23 DIAGNOSIS — N184 Chronic kidney disease, stage 4 (severe): Secondary | ICD-10-CM | POA: Diagnosis not present

## 2016-03-23 DIAGNOSIS — Z6836 Body mass index (BMI) 36.0-36.9, adult: Secondary | ICD-10-CM | POA: Insufficient documentation

## 2016-03-23 DIAGNOSIS — R9439 Abnormal result of other cardiovascular function study: Secondary | ICD-10-CM | POA: Diagnosis present

## 2016-03-23 HISTORY — PX: CARDIAC CATHETERIZATION: SHX172

## 2016-03-23 LAB — BASIC METABOLIC PANEL
ANION GAP: 6 (ref 5–15)
BUN: 45 mg/dL — ABNORMAL HIGH (ref 6–20)
CO2: 22 mmol/L (ref 22–32)
Calcium: 8.5 mg/dL — ABNORMAL LOW (ref 8.9–10.3)
Chloride: 111 mmol/L (ref 101–111)
Creatinine, Ser: 3.34 mg/dL — ABNORMAL HIGH (ref 0.44–1.00)
GFR calc Af Amer: 18 mL/min — ABNORMAL LOW (ref >60)
GFR, EST NON AFRICAN AMERICAN: 15 mL/min — AB (ref >60)
GLUCOSE: 182 mg/dL — AB (ref 65–99)
POTASSIUM: 4.8 mmol/L (ref 3.5–5.1)
Sodium: 139 mmol/L (ref 135–145)

## 2016-03-23 LAB — GLUCOSE, CAPILLARY
GLUCOSE-CAPILLARY: 176 mg/dL — AB (ref 65–99)
Glucose-Capillary: 125 mg/dL — ABNORMAL HIGH (ref 65–99)

## 2016-03-23 SURGERY — LEFT HEART CATH AND CORONARY ANGIOGRAPHY

## 2016-03-23 MED ORDER — LABETALOL HCL 5 MG/ML IV SOLN
INTRAVENOUS | Status: DC | PRN
  Administered 2016-03-23: 15 mg via INTRAVENOUS

## 2016-03-23 MED ORDER — FENTANYL CITRATE (PF) 100 MCG/2ML IJ SOLN
INTRAMUSCULAR | Status: AC
  Filled 2016-03-23: qty 2

## 2016-03-23 MED ORDER — SODIUM CHLORIDE 0.9% FLUSH: 3.0000 mL | INTRAVENOUS | Status: DC | PRN

## 2016-03-23 MED ORDER — MIDAZOLAM HCL 2 MG/2ML IJ SOLN
INTRAMUSCULAR | Status: AC
  Filled 2016-03-23: qty 2

## 2016-03-23 MED ORDER — LIDOCAINE HCL (PF) 1 % IJ SOLN
INTRAMUSCULAR | Status: DC | PRN
  Administered 2016-03-23: 13:00:00 via INTRA_ARTERIAL

## 2016-03-23 MED ORDER — VERAPAMIL HCL 2.5 MG/ML IV SOLN
INTRAVENOUS | Status: AC
  Filled 2016-03-23: qty 2

## 2016-03-23 MED ORDER — SODIUM CHLORIDE 0.9 % IV SOLN: 250.0000 mL | INTRAVENOUS | Status: DC | PRN

## 2016-03-23 MED ORDER — IOPAMIDOL (ISOVUE-370) INJECTION 76%
INTRAVENOUS | Status: DC | PRN
  Administered 2016-03-23: 15 mL via INTRAVENOUS

## 2016-03-23 MED ORDER — SODIUM CHLORIDE 0.9 % IV SOLN: INTRAVENOUS | Status: DC

## 2016-03-23 MED ORDER — LABETALOL HCL 5 MG/ML IV SOLN
INTRAVENOUS | Status: AC
  Filled 2016-03-23: qty 4

## 2016-03-23 MED ORDER — SODIUM CHLORIDE 0.9 % WEIGHT BASED INFUSION: 3.0000 mL/kg/h | INTRAVENOUS | Status: DC

## 2016-03-23 MED ORDER — HEPARIN SODIUM (PORCINE) 1000 UNIT/ML IJ SOLN
INTRAMUSCULAR | Status: DC | PRN
  Administered 2016-03-23: 3000 [IU] via INTRAVENOUS

## 2016-03-23 MED ORDER — FENTANYL CITRATE (PF) 100 MCG/2ML IJ SOLN
INTRAMUSCULAR | Status: DC | PRN
  Administered 2016-03-23: 25 ug via INTRAVENOUS

## 2016-03-23 MED ORDER — LIDOCAINE HCL (PF) 1 % IJ SOLN
INTRAMUSCULAR | Status: AC
  Filled 2016-03-23: qty 30

## 2016-03-23 MED ORDER — SODIUM CHLORIDE 0.9 % WEIGHT BASED INFUSION: 1.0000 mL/kg/h | INTRAVENOUS | Status: DC

## 2016-03-23 MED ORDER — LIDOCAINE HCL (PF) 1 % IJ SOLN
INTRAMUSCULAR | Status: DC | PRN
  Administered 2016-03-23: 2 mL via SUBCUTANEOUS

## 2016-03-23 MED ORDER — SODIUM CHLORIDE 0.9% FLUSH: 3.0000 mL | Freq: Two times a day (BID) | INTRAVENOUS | Status: DC

## 2016-03-23 MED ORDER — ASPIRIN 81 MG PO CHEW
CHEWABLE_TABLET | ORAL | Status: AC
  Administered 2016-03-23: 81 mg
  Filled 2016-03-23: qty 1

## 2016-03-23 MED ORDER — NITROGLYCERIN 1 MG/10 ML FOR IR/CATH LAB
INTRA_ARTERIAL | Status: AC
  Filled 2016-03-23: qty 10

## 2016-03-23 MED ORDER — SODIUM CHLORIDE 0.9 % IV BOLUS (SEPSIS)
500.0000 mL | Freq: Once | INTRAVENOUS | Status: AC
  Administered 2016-03-23: 500 mL via INTRAVENOUS

## 2016-03-23 MED ORDER — HEPARIN SODIUM (PORCINE) 1000 UNIT/ML IJ SOLN
INTRAMUSCULAR | Status: AC
  Filled 2016-03-23: qty 1

## 2016-03-23 MED ORDER — HEPARIN (PORCINE) IN NACL 2-0.9 UNIT/ML-% IJ SOLN
INTRAMUSCULAR | Status: AC
  Filled 2016-03-23: qty 1000

## 2016-03-23 MED ORDER — LISINOPRIL 20 MG PO TABS: 20.0000 mg | ORAL_TABLET | Freq: Every day | ORAL | Status: DC

## 2016-03-23 MED ORDER — HEPARIN (PORCINE) IN NACL 2-0.9 UNIT/ML-% IJ SOLN
INTRAMUSCULAR | Status: DC | PRN
  Administered 2016-03-23: 13:00:00

## 2016-03-23 MED ORDER — MIDAZOLAM HCL 2 MG/2ML IJ SOLN
INTRAMUSCULAR | Status: DC | PRN
  Administered 2016-03-23: 2 mg via INTRAVENOUS

## 2016-03-23 SURGICAL SUPPLY — 11 items
CATH INFINITI 5 FR JL3.5 (CATHETERS) ×2 IMPLANT
CATH OPTITORQUE TIG 4.0 5F (CATHETERS) ×2 IMPLANT
DEVICE RAD COMP TR BAND LRG (VASCULAR PRODUCTS) ×2 IMPLANT
GLIDESHEATH SLEND A-KIT 6F 20G (SHEATH) ×2 IMPLANT
GUIDEWIRE INQWIRE 1.5J.035X260 (WIRE) ×1 IMPLANT
INQWIRE 1.5J .035X260CM (WIRE) ×2
KIT HEART LEFT (KITS) ×2 IMPLANT
PACK CARDIAC CATHETERIZATION (CUSTOM PROCEDURE TRAY) ×2 IMPLANT
TRANSDUCER W/STOPCOCK (MISCELLANEOUS) ×2 IMPLANT
TUBING CIL FLEX 10 FLL-RA (TUBING) ×2 IMPLANT
WIRE EMERALD 3MM-J .035X260CM (WIRE) ×2 IMPLANT

## 2016-03-23 NOTE — Interval H&P Note (Signed)
History and Physical Interval Note:  03/23/2016 11:57 AM  Samantha [name]  has presented today for surgery, with the diagnosis of abnormal nuc  The various methods of treatment have been discussed with the patient and family. After consideration of risks, benefits and other options for treatment, the patient has consented to  Procedure(s): Left Heart Cath and Coronary Angiography (N/A) as a surgical intervention .  The patient's history has been reviewed, patient examined, no change in status, stable for surgery.  I have reviewed the patient's chart and labs.  Questions were answered to the patient's satisfaction.     Adrian Prows

## 2016-03-23 NOTE — Interval H&P Note (Signed)
History and Physical Interval Note:  03/23/2016 12:47 PM  [place]  has presented today for surgery, with the diagnosis of abnormal nuc  The various methods of treatment have been discussed with the patient and family. After consideration of risks, benefits and other options for treatment, the patient has consented to  Procedure(s): Left Heart Cath and Coronary Angiography (N/A) as a surgical intervention .  The patient's history has been reviewed, patient examined, no change in status, stable for surgery.  I have reviewed the patient's chart and labs.  Questions were answered to the patient's satisfaction.     Adrian Prows

## 2016-03-23 NOTE — Discharge Instructions (Signed)
° °  HOLD LISINOPRIL FOR 3 DAYS.  Radial Site Care Introduction Refer to this sheet in the next few weeks. These instructions provide you with information about caring for yourself after your procedure. Your health care provider may also give you more specific instructions. Your treatment has been planned according to current medical practices, but problems sometimes occur. Call your health care provider if you have any problems or questions after your procedure. What can I expect after the procedure? After your procedure, it is typical to have the following:  Bruising at the radial site that usually fades within 1-2 weeks.  Blood collecting in the tissue (hematoma) that may be painful to the touch. It should usually decrease in size and tenderness within 1-2 weeks. Follow these instructions at home:  Take medicines only as directed by your health care provider.  You may shower 24-48 hours after the procedure or as directed by your health care provider. Remove the bandage (dressing) and gently wash the site with plain soap and water. Pat the area dry with a clean towel. Do not rub the site, because this may cause bleeding.  Do not take baths, swim, or use a hot tub until your health care provider approves.  Check your insertion site every day for redness, swelling, or drainage.  Do not apply powder or lotion to the site.  Do not flex or bend the affected arm for 24 hours or as directed by your health care provider.  Do not push or pull heavy objects with the affected arm for 24 hours or as directed by your health care provider.  Do not lift over 10 lb (4.5 kg) for 5 days after your procedure or as directed by your health care provider.  Ask your health care provider when it is okay to:  Return to work or school.  Resume usual physical activities or sports.  Resume sexual activity.  Do not drive home if you are discharged the same day as the procedure. Have someone else drive  you.  You may drive 24 hours after the procedure unless otherwise instructed by your health care provider.  Do not operate machinery or power tools for 24 hours after the procedure.  If your procedure was done as an outpatient procedure, which means that you went home the same day as your procedure, a responsible adult should be with you for the first 24 hours after you arrive home.  Keep all follow-up visits as directed by your health care provider. This is important. Contact a health care provider if:  You have a fever.  You have chills.  You have increased bleeding from the radial site. Hold pressure on the site. Get help right away if:  You have unusual pain at the radial site.  You have redness, warmth, or swelling at the radial site.  You have drainage (other than a small amount of blood on the dressing) from the radial site.  The radial site is bleeding, and the bleeding does not stop after 30 minutes of holding steady pressure on the site.  Your arm or hand becomes pale, cool, tingly, or numb. This information is not intended to replace advice given to you by your health care provider. Make sure you discuss any questions you have with your health care provider. Document Released: 05/29/2010 Document Revised: 10/02/2015 Document Reviewed: 11/12/2013  2017 Elsevier

## 2016-12-21 ENCOUNTER — Other Ambulatory Visit: Payer: Self-pay

## 2016-12-21 DIAGNOSIS — N185 Chronic kidney disease, stage 5: Secondary | ICD-10-CM

## 2017-01-19 ENCOUNTER — Other Ambulatory Visit (HOSPITAL_COMMUNITY): Payer: BLUE CROSS/BLUE SHIELD

## 2017-01-19 ENCOUNTER — Encounter (HOSPITAL_COMMUNITY): Payer: BLUE CROSS/BLUE SHIELD

## 2017-01-21 ENCOUNTER — Encounter: Payer: BLUE CROSS/BLUE SHIELD | Admitting: Vascular Surgery

## 2017-03-15 ENCOUNTER — Encounter: Payer: Self-pay | Admitting: Nephrology

## 2017-06-16 ENCOUNTER — Encounter: Payer: Self-pay | Admitting: Nephrology

## 2018-01-12 DIAGNOSIS — Z1211 Encounter for screening for malignant neoplasm of colon: Secondary | ICD-10-CM

## 2018-01-12 DIAGNOSIS — I129 Hypertensive chronic kidney disease with stage 1 through stage 4 chronic kidney disease, or unspecified chronic kidney disease: Secondary | ICD-10-CM | POA: Diagnosis not present

## 2018-01-12 DIAGNOSIS — N185 Chronic kidney disease, stage 5: Secondary | ICD-10-CM | POA: Diagnosis not present

## 2018-01-12 DIAGNOSIS — E1122 Type 2 diabetes mellitus with diabetic chronic kidney disease: Secondary | ICD-10-CM | POA: Diagnosis not present

## 2018-01-12 DIAGNOSIS — Z79899 Other long term (current) drug therapy: Secondary | ICD-10-CM

## 2018-01-12 DIAGNOSIS — N08 Glomerular disorders in diseases classified elsewhere: Secondary | ICD-10-CM | POA: Diagnosis not present

## 2018-04-21 LAB — HM COLONOSCOPY

## 2018-05-02 ENCOUNTER — Other Ambulatory Visit: Payer: Self-pay | Admitting: Internal Medicine

## 2018-05-05 ENCOUNTER — Encounter: Payer: Self-pay | Admitting: Internal Medicine

## 2018-05-20 ENCOUNTER — Encounter: Payer: Self-pay | Admitting: Internal Medicine

## 2018-09-07 ENCOUNTER — Other Ambulatory Visit: Payer: Self-pay | Admitting: Internal Medicine

## 2018-09-21 ENCOUNTER — Telehealth: Payer: Self-pay

## 2018-09-21 NOTE — Telephone Encounter (Signed)
Pt requesting refill toujeo last appt 01/12/18 Scheduled appt today 09/27/18

## 2018-09-21 NOTE — Telephone Encounter (Signed)
pts daughter called and left a message that the pt had blacked out and that she was concerned. Called pt back and she doesn't remember blacking out. Provider suggests that she calls 911 due to pts past medical history.

## 2018-09-22 ENCOUNTER — Other Ambulatory Visit: Payer: Self-pay

## 2018-09-22 MED ORDER — INSULIN GLARGINE (1 UNIT DIAL) 300 UNIT/ML ~~LOC~~ SOPN: 24.0000 [IU] | PEN_INJECTOR | Freq: Every day | SUBCUTANEOUS | 0 refills | Status: DC

## 2018-09-27 ENCOUNTER — Other Ambulatory Visit: Payer: Self-pay

## 2018-09-27 ENCOUNTER — Ambulatory Visit (INDEPENDENT_AMBULATORY_CARE_PROVIDER_SITE_OTHER): Payer: Commercial Managed Care - PPO | Admitting: Internal Medicine

## 2018-09-27 ENCOUNTER — Encounter: Payer: Self-pay | Admitting: Internal Medicine

## 2018-09-27 ENCOUNTER — Ambulatory Visit: Payer: Commercial Managed Care - PPO | Admitting: Internal Medicine

## 2018-09-27 VITALS — BP 124/68 | HR 71 | Temp 98.0°F | Ht 67.0 in | Wt 216.0 lb

## 2018-09-27 DIAGNOSIS — Z1231 Encounter for screening mammogram for malignant neoplasm of breast: Secondary | ICD-10-CM

## 2018-09-27 DIAGNOSIS — E6609 Other obesity due to excess calories: Secondary | ICD-10-CM | POA: Diagnosis not present

## 2018-09-27 DIAGNOSIS — N184 Chronic kidney disease, stage 4 (severe): Secondary | ICD-10-CM | POA: Diagnosis not present

## 2018-09-27 DIAGNOSIS — E1122 Type 2 diabetes mellitus with diabetic chronic kidney disease: Secondary | ICD-10-CM

## 2018-09-27 DIAGNOSIS — I129 Hypertensive chronic kidney disease with stage 1 through stage 4 chronic kidney disease, or unspecified chronic kidney disease: Secondary | ICD-10-CM

## 2018-09-27 DIAGNOSIS — Z794 Long term (current) use of insulin: Secondary | ICD-10-CM

## 2018-09-27 DIAGNOSIS — Z6833 Body mass index (BMI) 33.0-33.9, adult: Secondary | ICD-10-CM

## 2018-09-27 NOTE — Patient Instructions (Signed)
Diabetes Mellitus and Exercise Exercising regularly is important for your overall health, especially when you have diabetes (diabetes mellitus). Exercising is not only about losing weight. It has many other health benefits, such as increasing muscle strength and bone density and reducing body fat and stress. This leads to improved fitness, flexibility, and endurance, all of which result in better overall health. Exercise has additional benefits for people with diabetes, including:  Reducing appetite.  Helping to lower and control blood glucose.  Lowering blood pressure.  Helping to control amounts of fatty substances (lipids) in the blood, such as cholesterol and triglycerides.  Helping the body to respond better to insulin (improving insulin sensitivity).  Reducing how much insulin the body needs.  Decreasing the risk for heart disease by: ? Lowering cholesterol and triglyceride levels. ? Increasing the levels of good cholesterol. ? Lowering blood glucose levels. What is my activity plan? Your health care provider or certified diabetes educator can help you make a plan for the type and frequency of exercise (activity plan) that works for you. Make sure that you:  Do at least 150 minutes of moderate-intensity or vigorous-intensity exercise each week. This could be brisk walking, biking, or water aerobics. ? Do stretching and strength exercises, such as yoga or weightlifting, at least 2 times a week. ? Spread out your activity over at least 3 days of the week.  Get some form of physical activity every day. ? Do not go more than 2 days in a row without some kind of physical activity. ? Avoid being inactive for more than 30 minutes at a time. Take frequent breaks to walk or stretch.  Choose a type of exercise or activity that you enjoy, and set realistic goals.  Start slowly, and gradually increase the intensity of your exercise over time. What do I need to know about managing my  diabetes?   Check your blood glucose before and after exercising. ? If your blood glucose is 240 mg/dL (13.3 mmol/L) or higher before you exercise, check your urine for ketones. If you have ketones in your urine, do not exercise until your blood glucose returns to normal. ? If your blood glucose is 100 mg/dL (5.6 mmol/L) or lower, eat a snack containing 15-20 grams of carbohydrate. Check your blood glucose 15 minutes after the snack to make sure that your level is above 100 mg/dL (5.6 mmol/L) before you start your exercise.  Know the symptoms of low blood glucose (hypoglycemia) and how to treat it. Your risk for hypoglycemia increases during and after exercise. Common symptoms of hypoglycemia can include: ? Hunger. ? Anxiety. ? Sweating and feeling clammy. ? Confusion. ? Dizziness or feeling light-headed. ? Increased heart rate or palpitations. ? Blurry vision. ? Tingling or numbness around the mouth, lips, or tongue. ? Tremors or shakes. ? Irritability.  Keep a rapid-acting carbohydrate snack available before, during, and after exercise to help prevent or treat hypoglycemia.  Avoid injecting insulin into areas of the body that are going to be exercised. For example, avoid injecting insulin into: ? The arms, when playing tennis. ? The legs, when jogging.  Keep records of your exercise habits. Doing this can help you and your health care provider adjust your diabetes management plan as needed. Write down: ? Food that you eat before and after you exercise. ? Blood glucose levels before and after you exercise. ? The type and amount of exercise you have done. ? When your insulin is expected to peak, if you use   insulin. Avoid exercising at times when your insulin is peaking.  When you start a new exercise or activity, work with your health care provider to make sure the activity is safe for you, and to adjust your insulin, medicines, or food intake as needed.  Drink plenty of water while  you exercise to prevent dehydration or heat stroke. Drink enough fluid to keep your urine clear or pale yellow. Summary  Exercising regularly is important for your overall health, especially when you have diabetes (diabetes mellitus).  Exercising has many health benefits, such as increasing muscle strength and bone density and reducing body fat and stress.  Your health care provider or certified diabetes educator can help you make a plan for the type and frequency of exercise (activity plan) that works for you.  When you start a new exercise or activity, work with your health care provider to make sure the activity is safe for you, and to adjust your insulin, medicines, or food intake as needed. This information is not intended to replace advice given to you by your health care provider. Make sure you discuss any questions you have with your health care provider. Document Released: 07/17/2003 Document Revised: 11/04/2016 Document Reviewed: 10/06/2015 Elsevier Interactive Patient Education  2019 [employer]  

## 2018-09-27 NOTE — Progress Notes (Signed)
Subjective:     Patient ID: Samantha [name] , female    DOB: [birth year] , 53 y.o.   MRN: [id number]   Chief Complaint  Patient presents with  . Diabetes  . Hypertension    HPI  Diabetes  She presents for her follow-up diabetic visit. She has type 2 diabetes mellitus. Her disease course has been stable. There are no hypoglycemic associated symptoms. Pertinent negatives for diabetes include no blurred vision and no chest pain. There are no hypoglycemic complications. Symptoms are improving. Diabetic complications include nephropathy. Risk factors for coronary artery disease include diabetes mellitus, dyslipidemia, hypertension, obesity and sedentary lifestyle. She is following a generally healthy diet. She participates in exercise intermittently. Her breakfast blood glucose range is generally 130-140 mg/dl. An ACE inhibitor/angiotensin II receptor blocker is contraindicated.  Hypertension  This is a chronic problem. The current episode started more than 1 year ago. The problem has been gradually improving since onset. The problem is controlled. Pertinent negatives include no blurred vision, chest pain, palpitations or shortness of breath. Past treatments include ACE inhibitors and diuretics. The current treatment provides moderate improvement. Compliance problems include exercise.  Hypertensive end-organ damage includes kidney disease.     Past Medical History:  Diagnosis Date  . Anemia   . Chronic kidney disease (CKD), stage III (moderate) (HCC)   . Hypertension   . Uncontrolled diabetes mellitus with microalbuminuria or microproteinuria      Family History  Problem Relation Age of Onset  . Hypertension Mother   . Diabetes Mother   . Kidney disease Mother   . Crohn's disease Mother   . Diabetes Father   . Hypertension Father   . Ulcers Father   . Kidney disease Father      Current Outpatient Medications:  .  amLODipine (NORVASC) 10 MG tablet, Take 1 tablet (10 mg total) by  mouth daily., Disp: 30 tablet, Rfl: 0 .  atorvastatin (LIPITOR) 10 MG tablet, Take 10 mg by mouth daily at 6 PM. , Disp: , Rfl:  .  BYSTOLIC 10 MG tablet, Take 10 mg by mouth daily., Disp: , Rfl:  .  calcitRIOL (ROCALTROL) 0.25 MCG capsule, Take 0.75 mcg by mouth daily., Disp: , Rfl:  .  carvedilol (COREG) 12.5 MG tablet, Take 12.5 mg by mouth 2 (two) times daily with a meal., Disp: , Rfl:  .  hydrALAZINE (APRESOLINE) 50 MG tablet, Take by mouth., Disp: , Rfl:  .  Insulin Glargine, 1 Unit Dial, (TOUJEO SOLOSTAR) 300 UNIT/ML SOPN, Inject 24 Units into the skin at bedtime., Disp: 3 pen, Rfl: 0 .  IRON PO, Take 1 tablet by mouth daily. , Disp: , Rfl:  .  omeprazole (PRILOSEC) 40 MG capsule, TAKE 1 CAPSULE BY MOUTH 20 MINUTES BEFORE BREAKFAST, Disp: , Rfl:  .  OZEMPIC, 0.25 OR 0.5 MG/DOSE, 2 MG/1.5ML SOPN, INJECT 0.5MG INTO SKIN ONCE WEEKLY ON SAME DAY EACH WK. ROTATE INJECTION SITE ABDOMEN/THIGH/UPPERARM, Disp: , Rfl:  .  fexofenadine (ALLEGRA) 180 MG tablet, Take 180 mg by mouth daily as needed for allergies or rhinitis., Disp: , Rfl:  .  furosemide (LASIX) 20 MG tablet, Take 20 mg by mouth daily., Disp: , Rfl: 3 .  ibuprofen (ADVIL,MOTRIN) 600 MG tablet, Take 1 tablet (600 mg total) by mouth every 6 (six) hours as needed. (Patient not taking: Reported on 03/17/2016), Disp: 60 tablet, Rfl: 3 .  oxyCODONE-acetaminophen (ROXICET) 5-325 MG per tablet, Take 1-2 tablets by mouth every 4 (four) hours as needed for  severe pain. (Patient not taking: Reported on 03/17/2016), Disp: 30 tablet, Rfl: 0   No Known Allergies   Review of Systems  Constitutional: Negative.   Eyes: Negative for blurred vision.  Respiratory: Negative.  Negative for shortness of breath.   Cardiovascular: Negative.  Negative for chest pain and palpitations.  Gastrointestinal: Negative.   Neurological: Negative.   Psychiatric/Behavioral: Negative.      Today's Vitals   09/27/18 0948  BP: 124/68  Pulse: 71  Temp: 98 F (36.7 C)   TempSrc: Oral  Weight: 216 lb (98 kg)  Height: 5' 7"  (1.702 m)   Body mass index is 33.83 kg/m.   Objective:  Physical Exam Vitals signs and nursing note reviewed.  Constitutional:      Appearance: Normal appearance.  HENT:     Head: Normocephalic and atraumatic.  Cardiovascular:     Rate and Rhythm: Normal rate and regular rhythm.     Heart sounds: Normal heart sounds.  Pulmonary:     Effort: Pulmonary effort is normal.     Breath sounds: Normal breath sounds.  Skin:    General: Skin is warm.  Neurological:     General: No focal deficit present.     Mental Status: She is alert.  Psychiatric:        Mood and Affect: Mood normal.        Behavior: Behavior normal.         Assessment And Plan:     1. Type 2 diabetes mellitus with stage 4 chronic kidney disease, with long-term current use of insulin ([place])  She has not been seen in greater than six months. Importance of medication, dietary and office visit compliance was discussed with the patient. She is currently on [facility] list [facility] She is encouraged to incorporate more exercise into her daily routine.   - CMP14+EGFR - Hemoglobin A1c - Lipid panel  2. Hypertensive nephropathy  Well controlled. She will continue with current meds. She is encouraged to avoid adding salt to her foods.   3. Breast cancer screening by mammogram  I will refer her to GYN for annual mammogram and pelvic exam.   4. Class 1 obesity due to excess calories with serious comorbidity and body mass index (BMI) of 33.0 to 33.9 in adult  Importance of achieving optimal weight to decrease risk of cardiovascular disease and cancers was discussed with the patient in full detail. She is encouraged to start slowly - start with 10 minutes twice daily at least three to four days per week and to gradually build to 30 minutes five days weekly. She was given tips to incorporate more activity into her daily routine - take stairs when  possible, park farther away from her job, grocery stores, etc.    Maximino Greenland, MD    THE PATIENT IS ENCOURAGED TO PRACTICE SOCIAL DISTANCING DUE TO THE COVID-19 PANDEMIC.

## 2018-09-28 LAB — CMP14+EGFR
ALT: 13 IU/L (ref 0–32)
AST: 13 IU/L (ref 0–40)
Albumin/Globulin Ratio: 1.1 — ABNORMAL LOW (ref 1.2–2.2)
Albumin: 3.9 g/dL (ref 3.8–4.9)
Alkaline Phosphatase: 91 IU/L (ref 39–117)
BUN/Creatinine Ratio: 15 (ref 9–23)
BUN: 74 mg/dL — ABNORMAL HIGH (ref 6–24)
Bilirubin Total: <0.2 mg/dL (ref 0.0–1.2)
CO2: 18 mmol/L — ABNORMAL LOW (ref 20–29)
Calcium: 9.4 mg/dL (ref 8.7–10.2)
Chloride: 109 mmol/L — ABNORMAL HIGH (ref 96–106)
Creatinine, Ser: 5.06 mg/dL — ABNORMAL HIGH (ref 0.57–1.00)
GFR calc Af Amer: 11 mL/min/{1.73_m2} — ABNORMAL LOW (ref >59)
GFR calc non Af Amer: 9 mL/min/{1.73_m2} — ABNORMAL LOW (ref >59)
Globulin, Total: 3.4 g/dL (ref 1.5–4.5)
Glucose: 159 mg/dL — ABNORMAL HIGH (ref 65–99)
Potassium: 4.9 mmol/L (ref 3.5–5.2)
Sodium: 144 mmol/L (ref 134–144)
Total Protein: 7.3 g/dL (ref 6.0–8.5)

## 2018-09-28 LAB — LIPID PANEL
Chol/HDL Ratio: 4.5 ratio — ABNORMAL HIGH (ref 0.0–4.4)
Cholesterol, Total: 156 mg/dL (ref 100–199)
HDL: 35 mg/dL — ABNORMAL LOW (ref >39)
LDL Calculated: 97 mg/dL (ref 0–99)
Triglycerides: 118 mg/dL (ref 0–149)
VLDL Cholesterol Cal: 24 mg/dL (ref 5–40)

## 2018-09-28 LAB — HEMOGLOBIN A1C
Est. average glucose Bld gHb Est-mCnc: 154 mg/dL
Hgb A1c MFr Bld: 7 % — ABNORMAL HIGH (ref 4.8–5.6)

## 2018-10-03 ENCOUNTER — Encounter: Payer: Commercial Managed Care - PPO | Admitting: Nurse Practitioner

## 2018-10-13 ENCOUNTER — Other Ambulatory Visit: Payer: Self-pay | Admitting: Internal Medicine

## 2018-10-14 ENCOUNTER — Other Ambulatory Visit: Payer: Self-pay | Admitting: Internal Medicine

## 2018-12-12 DIAGNOSIS — N2581 Secondary hyperparathyroidism of renal origin: Secondary | ICD-10-CM | POA: Diagnosis not present

## 2018-12-12 DIAGNOSIS — N185 Chronic kidney disease, stage 5: Secondary | ICD-10-CM | POA: Diagnosis not present

## 2018-12-12 DIAGNOSIS — Z7682 Awaiting organ transplant status: Secondary | ICD-10-CM | POA: Diagnosis not present

## 2018-12-12 DIAGNOSIS — D631 Anemia in chronic kidney disease: Secondary | ICD-10-CM | POA: Diagnosis not present

## 2018-12-12 DIAGNOSIS — R809 Proteinuria, unspecified: Secondary | ICD-10-CM | POA: Diagnosis not present

## 2018-12-12 DIAGNOSIS — I120 Hypertensive chronic kidney disease with stage 5 chronic kidney disease or end stage renal disease: Secondary | ICD-10-CM | POA: Diagnosis not present

## 2018-12-15 DIAGNOSIS — H3582 Retinal ischemia: Secondary | ICD-10-CM | POA: Diagnosis not present

## 2018-12-15 DIAGNOSIS — E113513 Type 2 diabetes mellitus with proliferative diabetic retinopathy with macular edema, bilateral: Secondary | ICD-10-CM | POA: Diagnosis not present

## 2018-12-28 ENCOUNTER — Encounter: Payer: Self-pay | Admitting: Internal Medicine

## 2018-12-28 ENCOUNTER — Ambulatory Visit (INDEPENDENT_AMBULATORY_CARE_PROVIDER_SITE_OTHER): Payer: Medicaid Other | Admitting: Internal Medicine

## 2018-12-28 ENCOUNTER — Other Ambulatory Visit: Payer: Self-pay

## 2018-12-28 VITALS — BP 152/80 | HR 74 | Temp 98.4°F | Ht 67.0 in | Wt 216.2 lb

## 2018-12-28 DIAGNOSIS — E6609 Other obesity due to excess calories: Secondary | ICD-10-CM | POA: Diagnosis not present

## 2018-12-28 DIAGNOSIS — Z7682 Awaiting organ transplant status: Secondary | ICD-10-CM

## 2018-12-28 DIAGNOSIS — Z794 Long term (current) use of insulin: Secondary | ICD-10-CM

## 2018-12-28 DIAGNOSIS — E1122 Type 2 diabetes mellitus with diabetic chronic kidney disease: Secondary | ICD-10-CM

## 2018-12-28 DIAGNOSIS — I129 Hypertensive chronic kidney disease with stage 1 through stage 4 chronic kidney disease, or unspecified chronic kidney disease: Secondary | ICD-10-CM

## 2018-12-28 DIAGNOSIS — N184 Chronic kidney disease, stage 4 (severe): Secondary | ICD-10-CM

## 2018-12-28 DIAGNOSIS — Z6833 Body mass index (BMI) 33.0-33.9, adult: Secondary | ICD-10-CM | POA: Diagnosis not present

## 2018-12-28 NOTE — Patient Instructions (Signed)
Diabetes Mellitus and Foot Care Foot care is an important part of your health, especially when you have diabetes. Diabetes may cause you to have problems because of poor blood flow (circulation) to your feet and legs, which can cause your skin to:  Become thinner and drier.  Break more easily.  Heal more slowly.  Peel and crack. You may also have nerve damage (neuropathy) in your legs and feet, causing decreased feeling in them. This means that you may not notice minor injuries to your feet that could lead to more serious problems. Noticing and addressing any potential problems early is the best way to prevent future foot problems. How to care for your feet Foot hygiene  Wash your feet daily with warm water and mild soap. Do not use hot water. Then, pat your feet and the areas between your toes until they are completely dry. Do not soak your feet as this can dry your skin.  Trim your toenails straight across. Do not dig under them or around the cuticle. File the edges of your nails with an emery board or nail file.  Apply a moisturizing lotion or petroleum jelly to the skin on your feet and to dry, brittle toenails. Use lotion that does not contain alcohol and is unscented. Do not apply lotion between your toes. Shoes and socks  Wear clean socks or stockings every day. Make sure they are not too tight. Do not wear knee-high stockings since they may decrease blood flow to your legs.  Wear shoes that fit properly and have enough cushioning. Always look in your shoes before you put them on to be sure there are no objects inside.  To break in new shoes, wear them for just a few hours a day. This prevents injuries on your feet. Wounds, scrapes, corns, and calluses  Check your feet daily for blisters, cuts, bruises, sores, and redness. If you cannot see the bottom of your feet, use a mirror or ask someone for help.  Do not cut corns or calluses or try to remove them with medicine.  If you  find a minor scrape, cut, or break in the skin on your feet, keep it and the skin around it clean and dry. You may clean these areas with mild soap and water. Do not clean the area with peroxide, alcohol, or iodine.  If you have a wound, scrape, corn, or callus on your foot, look at it several times a day to make sure it is healing and not infected. Check for: ? Redness, swelling, or pain. ? Fluid or blood. ? Warmth. ? Pus or a bad smell. General instructions  Do not cross your legs. This may decrease blood flow to your feet.  Do not use heating pads or hot water bottles on your feet. They may burn your skin. If you have lost feeling in your feet or legs, you may not know this is happening until it is too late.  Protect your feet from hot and cold by wearing shoes, such as at the beach or on hot pavement.  Schedule a complete foot exam at least once a year (annually) or more often if you have foot problems. If you have foot problems, report any cuts, sores, or bruises to your health care provider immediately. Contact a health care provider if:  You have a medical condition that increases your risk of infection and you have any cuts, sores, or bruises on your feet.  You have an injury that is not   healing.  You have redness on your legs or feet.  You feel burning or tingling in your legs or feet.  You have pain or cramps in your legs and feet.  Your legs or feet are numb.  Your feet always feel cold.  You have pain around a toenail. Get help right away if:  You have a wound, scrape, corn, or callus on your foot and: ? You have pain, swelling, or redness that gets worse. ? You have fluid or blood coming from the wound, scrape, corn, or callus. ? Your wound, scrape, corn, or callus feels warm to the touch. ? You have pus or a bad smell coming from the wound, scrape, corn, or callus. ? You have a fever. ? You have a red line going up your leg. Summary  Check your feet every day  for cuts, sores, red spots, swelling, and blisters.  Moisturize feet and legs daily.  Wear shoes that fit properly and have enough cushioning.  If you have foot problems, report any cuts, sores, or bruises to your health care provider immediately.  Schedule a complete foot exam at least once a year (annually) or more often if you have foot problems. This information is not intended to replace advice given to you by your health care provider. Make sure you discuss any questions you have with your health care provider. Document Released: 04/23/2000 Document Revised: 06/08/2017 Document Reviewed: 05/28/2016 Elsevier Patient Education  2020 [employer]  

## 2018-12-29 LAB — HEMOGLOBIN A1C
Est. average glucose Bld gHb Est-mCnc: 151 mg/dL
Hgb A1c MFr Bld: 6.9 % — ABNORMAL HIGH (ref 4.8–5.6)

## 2018-12-29 LAB — CBC
Hematocrit: 33.9 % — ABNORMAL LOW (ref 34.0–46.6)
Hemoglobin: 10.8 g/dL — ABNORMAL LOW (ref 11.1–15.9)
MCH: 26.5 pg — ABNORMAL LOW (ref 26.6–33.0)
MCHC: 31.9 g/dL (ref 31.5–35.7)
MCV: 83 fL (ref 79–97)
Platelets: 317 10*3/uL (ref 150–450)
RBC: 4.07 x10E6/uL (ref 3.77–5.28)
RDW: 14.3 % (ref 11.7–15.4)
WBC: 7.8 10*3/uL (ref 3.4–10.8)

## 2018-12-30 NOTE — Progress Notes (Signed)
Subjective:     Patient ID: Samantha [name] , female    DOB: 1965-09-11 , 53 y.o.   MRN: [id number]   Chief Complaint  Patient presents with  . Hypertension  . Diabetes    HPI  Hypertension This is a chronic problem. The current episode started more than 1 year ago. The problem has been gradually improving since onset. The problem is controlled. Pertinent negatives include no blurred vision, chest pain, palpitations or shortness of breath. Risk factors for coronary artery disease include obesity, sedentary lifestyle, diabetes mellitus and dyslipidemia. Past treatments include ACE inhibitors and diuretics. The current treatment provides moderate improvement. Compliance problems include exercise.  Hypertensive end-organ damage includes kidney disease.  Diabetes She presents for her follow-up diabetic visit. She has type 2 diabetes mellitus. Her disease course has been stable. There are no hypoglycemic associated symptoms. Pertinent negatives for diabetes include no blurred vision and no chest pain. There are no hypoglycemic complications. Symptoms are improving. Diabetic complications include nephropathy. Risk factors for coronary artery disease include diabetes mellitus, dyslipidemia, hypertension, obesity and sedentary lifestyle. She is following a generally healthy diet. She participates in exercise intermittently. Her breakfast blood glucose range is generally 130-140 mg/dl. An ACE inhibitor/angiotensin II receptor blocker is contraindicated.     Past Medical History:  Diagnosis Date  . Anemia   . Chronic kidney disease (CKD), stage III (moderate) (HCC)   . Hypertension   . Uncontrolled diabetes mellitus with microalbuminuria or microproteinuria      Family History  Problem Relation Age of Onset  . Hypertension Mother   . Diabetes Mother   . Kidney disease Mother   . Crohn's disease Mother   . Diabetes Father   . Hypertension Father   . Ulcers Father   . Kidney disease Father       Current Outpatient Medications:  .  calcitRIOL (ROCALTROL) 0.25 MCG capsule, Take 0.75 mcg by mouth daily., Disp: , Rfl:  .  Continuous Blood Gluc Sensor (FREESTYLE LIBRE 14 DAY SENSOR) MISC, USE AS DIRECTED TO CHECK BLOOD SUGARS 4 TIMES PER DAY DX: E11.65, Disp: 4 each, Rfl: 2 .  fexofenadine (ALLEGRA) 180 MG tablet, Take 180 mg by mouth daily as needed for allergies or rhinitis., Disp: , Rfl:  .  furosemide (LASIX) 20 MG tablet, Take 20 mg by mouth daily., Disp: , Rfl: 3 .  hydrALAZINE (APRESOLINE) 50 MG tablet, Take by mouth., Disp: , Rfl:  .  IRON PO, Take 1 tablet by mouth daily. , Disp: , Rfl:  .  omeprazole (PRILOSEC) 40 MG capsule, TAKE 1 CAPSULE BY MOUTH 20 MINUTES BEFORE BREAKFAST, Disp: , Rfl:  .  OZEMPIC, 0.25 OR 0.5 MG/DOSE, 2 MG/1.5ML SOPN, INJECT 0.5MG  INTO SKIN ONCE WEEKLY ON SAME DAY EACH WK. ROTATE INJECTION SITE ABDOMEN/THIGH/UPPERARM, Disp: , Rfl:  .  TOUJEO SOLOSTAR 300 UNIT/ML SOPN, INJECT 24 UNITS INTO THE SKIN AT BEDTIME., Disp: 4.5 pen, Rfl: 3 .  amLODipine (NORVASC) 10 MG tablet, Take 1 tablet (10 mg total) by mouth daily. (Patient not taking: Reported on 12/28/2018), Disp: 30 tablet, Rfl: 0 .  atorvastatin (LIPITOR) 10 MG tablet, Take 10 mg by mouth daily at 6 PM. , Disp: , Rfl:  .  BYSTOLIC 10 MG tablet, Take 10 mg by mouth daily., Disp: , Rfl:  .  carvedilol (COREG) 12.5 MG tablet, Take 12.5 mg by mouth 2 (two) times daily with a meal., Disp: , Rfl:  .  ibuprofen (ADVIL,MOTRIN) 600 MG tablet, Take 1  tablet (600 mg total) by mouth every 6 (six) hours as needed. (Patient not taking: Reported on 03/17/2016), Disp: 60 tablet, Rfl: 3   No Known Allergies   Review of Systems  Constitutional: Negative.   Eyes: Negative for blurred vision.  Respiratory: Negative.  Negative for shortness of breath.   Cardiovascular: Negative.  Negative for chest pain and palpitations.  Gastrointestinal: Negative.   Neurological: Negative.   Psychiatric/Behavioral: Negative.       Today's Vitals   12/28/18 1137  BP: (!) 152/80  Pulse: 74  Temp: 98.4 F (36.9 C)  TempSrc: Oral  Weight: 216 lb 3.2 oz (98.1 kg)  Height: 5\' 7"  (1.702 m)   Body mass index is 33.86 kg/m.   Objective:  Physical Exam Vitals signs and nursing note reviewed.  Constitutional:      Appearance: Normal appearance.  HENT:     Head: Normocephalic and atraumatic.  Cardiovascular:     Rate and Rhythm: Normal rate and regular rhythm.     Heart sounds: Normal heart sounds.  Pulmonary:     Effort: Pulmonary effort is normal.     Breath sounds: Normal breath sounds.  Skin:    General: Skin is warm.  Neurological:     General: No focal deficit present.     Mental Status: She is alert.  Psychiatric:        Mood and Affect: Mood normal.        Behavior: Behavior normal.         Assessment And Plan:     1. Hypertensive nephropathy  Chronic, yet uncontrolled. She admits she has not taken her meds today. Importance of medication compliance to avoid worsening of renal function was stressed to the patient.   2. Type 2 diabetes mellitus with stage 4 chronic kidney disease, with long-term current use of insulin (HCC)  Chronic, also followed by Endocrinology. She can't recall her last visit. I will check an a1c.  She is also followed by Nephrology, currently awaiting renal transplant.   - CBC no Diff - Hemoglobin A1c  3. Class 1 obesity due to excess calories with serious comorbidity and body mass index (BMI) of 33.0 to 33.9 in adult  Importance of achieving optimal weight to decrease risk of cardiovascular disease and cancers was discussed with the patient in full detail. Importance of regular exercise was discussed with the patient. She is encouraged to start slowly - start with 10 minutes twice daily at least three to four days per week and to gradually build to 30 minutes five days weekly. She was given tips to incorporate more activity into her daily routine - take stairs when  possible, park farther away from her job, grocery stores, etc.    4. Awaiting transplantation of kidney   Maximino Greenland, MD    THE PATIENT IS ENCOURAGED TO PRACTICE SOCIAL DISTANCING DUE TO THE COVID-19 PANDEMIC.

## 2019-01-02 ENCOUNTER — Encounter: Payer: Self-pay | Admitting: Internal Medicine

## 2019-01-16 ENCOUNTER — Telehealth: Payer: Self-pay | Admitting: Internal Medicine

## 2019-01-16 DIAGNOSIS — N185 Chronic kidney disease, stage 5: Secondary | ICD-10-CM | POA: Diagnosis not present

## 2019-01-18 NOTE — Telephone Encounter (Signed)
PA FOR OZEMPIC WAS CANCELLED THRU [employer] DUE TO PT HAS MEDICAID AND PA HAS TO BE COMPLETED THRU [employer] MEDICAID PORTAL

## 2019-02-08 DIAGNOSIS — Z1231 Encounter for screening mammogram for malignant neoplasm of breast: Secondary | ICD-10-CM | POA: Diagnosis not present

## 2019-02-08 DIAGNOSIS — Z0000 Encounter for general adult medical examination without abnormal findings: Secondary | ICD-10-CM | POA: Diagnosis not present

## 2019-02-08 DIAGNOSIS — Z124 Encounter for screening for malignant neoplasm of cervix: Secondary | ICD-10-CM | POA: Diagnosis not present

## 2019-02-08 LAB — HM PAP SMEAR

## 2019-02-09 DIAGNOSIS — Z7682 Awaiting organ transplant status: Secondary | ICD-10-CM | POA: Diagnosis not present

## 2019-02-09 DIAGNOSIS — N2581 Secondary hyperparathyroidism of renal origin: Secondary | ICD-10-CM | POA: Diagnosis not present

## 2019-02-09 DIAGNOSIS — R809 Proteinuria, unspecified: Secondary | ICD-10-CM | POA: Diagnosis not present

## 2019-02-09 DIAGNOSIS — E1122 Type 2 diabetes mellitus with diabetic chronic kidney disease: Secondary | ICD-10-CM | POA: Diagnosis not present

## 2019-02-09 DIAGNOSIS — Z23 Encounter for immunization: Secondary | ICD-10-CM | POA: Diagnosis not present

## 2019-02-09 DIAGNOSIS — D631 Anemia in chronic kidney disease: Secondary | ICD-10-CM | POA: Diagnosis not present

## 2019-02-09 DIAGNOSIS — N185 Chronic kidney disease, stage 5: Secondary | ICD-10-CM | POA: Diagnosis not present

## 2019-02-09 DIAGNOSIS — I120 Hypertensive chronic kidney disease with stage 5 chronic kidney disease or end stage renal disease: Secondary | ICD-10-CM | POA: Diagnosis not present

## 2019-02-09 DIAGNOSIS — H3582 Retinal ischemia: Secondary | ICD-10-CM | POA: Diagnosis not present

## 2019-02-09 DIAGNOSIS — E113513 Type 2 diabetes mellitus with proliferative diabetic retinopathy with macular edema, bilateral: Secondary | ICD-10-CM | POA: Diagnosis not present

## 2019-02-12 LAB — HM MAMMOGRAPHY

## 2019-02-20 ENCOUNTER — Telehealth: Payer: Self-pay

## 2019-02-20 NOTE — Telephone Encounter (Signed)
Per Dr. Baird Cancer, call pt to come pick up samples for medication that has not yet been approved by insurance company Ozempic, and Toujeo solostar Pt stated she will stop by sometime on Thursday 02/22/2019

## 2019-03-08 DIAGNOSIS — B977 Papillomavirus as the cause of diseases classified elsewhere: Secondary | ICD-10-CM | POA: Diagnosis not present

## 2019-03-08 DIAGNOSIS — R87810 Cervical high risk human papillomavirus (HPV) DNA test positive: Secondary | ICD-10-CM | POA: Diagnosis not present

## 2019-03-15 DIAGNOSIS — E1122 Type 2 diabetes mellitus with diabetic chronic kidney disease: Secondary | ICD-10-CM | POA: Diagnosis not present

## 2019-03-15 DIAGNOSIS — D631 Anemia in chronic kidney disease: Secondary | ICD-10-CM | POA: Diagnosis not present

## 2019-03-15 DIAGNOSIS — R809 Proteinuria, unspecified: Secondary | ICD-10-CM | POA: Diagnosis not present

## 2019-03-15 DIAGNOSIS — N185 Chronic kidney disease, stage 5: Secondary | ICD-10-CM | POA: Diagnosis not present

## 2019-03-15 DIAGNOSIS — I120 Hypertensive chronic kidney disease with stage 5 chronic kidney disease or end stage renal disease: Secondary | ICD-10-CM | POA: Diagnosis not present

## 2019-03-15 DIAGNOSIS — N2581 Secondary hyperparathyroidism of renal origin: Secondary | ICD-10-CM | POA: Diagnosis not present

## 2019-03-15 DIAGNOSIS — Z7682 Awaiting organ transplant status: Secondary | ICD-10-CM | POA: Diagnosis not present

## 2019-03-15 DIAGNOSIS — M62838 Other muscle spasm: Secondary | ICD-10-CM | POA: Diagnosis not present

## 2019-04-16 DIAGNOSIS — N185 Chronic kidney disease, stage 5: Secondary | ICD-10-CM | POA: Diagnosis not present

## 2019-04-20 DIAGNOSIS — E113513 Type 2 diabetes mellitus with proliferative diabetic retinopathy with macular edema, bilateral: Secondary | ICD-10-CM | POA: Diagnosis not present

## 2019-04-20 DIAGNOSIS — H3582 Retinal ischemia: Secondary | ICD-10-CM | POA: Diagnosis not present

## 2019-04-28 DIAGNOSIS — Z20828 Contact with and (suspected) exposure to other viral communicable diseases: Secondary | ICD-10-CM | POA: Diagnosis not present

## 2019-05-07 ENCOUNTER — Telehealth: Payer: Self-pay

## 2019-05-07 NOTE — Telephone Encounter (Signed)
The pt called and said that she was diagnosed with coronavirus and was tested last Saturday.  The pt said that she has notified her kidney doctor.  The pt said that she has a dry cough when she talks to much. The pt was told to take coricidin hbp cough medication and oscillococcinum o-t-c.

## 2019-05-14 DIAGNOSIS — N185 Chronic kidney disease, stage 5: Secondary | ICD-10-CM | POA: Diagnosis not present

## 2019-05-15 DIAGNOSIS — D631 Anemia in chronic kidney disease: Secondary | ICD-10-CM | POA: Diagnosis not present

## 2019-05-15 DIAGNOSIS — E1122 Type 2 diabetes mellitus with diabetic chronic kidney disease: Secondary | ICD-10-CM | POA: Diagnosis not present

## 2019-05-15 DIAGNOSIS — I120 Hypertensive chronic kidney disease with stage 5 chronic kidney disease or end stage renal disease: Secondary | ICD-10-CM | POA: Diagnosis not present

## 2019-05-15 DIAGNOSIS — M62838 Other muscle spasm: Secondary | ICD-10-CM | POA: Diagnosis not present

## 2019-05-15 DIAGNOSIS — N185 Chronic kidney disease, stage 5: Secondary | ICD-10-CM | POA: Diagnosis not present

## 2019-05-15 DIAGNOSIS — Z7682 Awaiting organ transplant status: Secondary | ICD-10-CM | POA: Diagnosis not present

## 2019-05-15 DIAGNOSIS — R809 Proteinuria, unspecified: Secondary | ICD-10-CM | POA: Diagnosis not present

## 2019-05-15 DIAGNOSIS — N2581 Secondary hyperparathyroidism of renal origin: Secondary | ICD-10-CM | POA: Diagnosis not present

## 2019-05-26 DIAGNOSIS — Z20828 Contact with and (suspected) exposure to other viral communicable diseases: Secondary | ICD-10-CM | POA: Diagnosis not present

## 2019-06-12 DIAGNOSIS — N185 Chronic kidney disease, stage 5: Secondary | ICD-10-CM | POA: Diagnosis not present

## 2019-06-14 DIAGNOSIS — D631 Anemia in chronic kidney disease: Secondary | ICD-10-CM | POA: Diagnosis not present

## 2019-06-14 DIAGNOSIS — M62838 Other muscle spasm: Secondary | ICD-10-CM | POA: Diagnosis not present

## 2019-06-14 DIAGNOSIS — R809 Proteinuria, unspecified: Secondary | ICD-10-CM | POA: Diagnosis not present

## 2019-06-14 DIAGNOSIS — Z7682 Awaiting organ transplant status: Secondary | ICD-10-CM | POA: Diagnosis not present

## 2019-06-14 DIAGNOSIS — N2581 Secondary hyperparathyroidism of renal origin: Secondary | ICD-10-CM | POA: Diagnosis not present

## 2019-06-14 DIAGNOSIS — E1122 Type 2 diabetes mellitus with diabetic chronic kidney disease: Secondary | ICD-10-CM | POA: Diagnosis not present

## 2019-06-14 DIAGNOSIS — I120 Hypertensive chronic kidney disease with stage 5 chronic kidney disease or end stage renal disease: Secondary | ICD-10-CM | POA: Diagnosis not present

## 2019-06-14 DIAGNOSIS — N185 Chronic kidney disease, stage 5: Secondary | ICD-10-CM | POA: Diagnosis not present

## 2019-06-15 DIAGNOSIS — H3582 Retinal ischemia: Secondary | ICD-10-CM | POA: Diagnosis not present

## 2019-06-15 DIAGNOSIS — H35372 Puckering of macula, left eye: Secondary | ICD-10-CM | POA: Diagnosis not present

## 2019-06-15 DIAGNOSIS — H4321 Crystalline deposits in vitreous body, right eye: Secondary | ICD-10-CM | POA: Diagnosis not present

## 2019-06-15 DIAGNOSIS — E113513 Type 2 diabetes mellitus with proliferative diabetic retinopathy with macular edema, bilateral: Secondary | ICD-10-CM | POA: Diagnosis not present

## 2019-06-25 ENCOUNTER — Other Ambulatory Visit (HOSPITAL_COMMUNITY): Payer: Self-pay | Admitting: *Deleted

## 2019-06-25 NOTE — Discharge Instructions (Signed)

## 2019-06-26 ENCOUNTER — Ambulatory Visit (HOSPITAL_COMMUNITY)
Admission: RE | Admit: 2019-06-26 | Discharge: 2019-06-26 | Disposition: A | Discharge status: Home / Self Care | Payer: Medicaid Other | Source: Ambulatory Visit | Attending: Nephrology | Admitting: Nephrology

## 2019-06-26 ENCOUNTER — Other Ambulatory Visit: Payer: Self-pay

## 2019-06-26 DIAGNOSIS — N185 Chronic kidney disease, stage 5: Secondary | ICD-10-CM | POA: Insufficient documentation

## 2019-06-26 DIAGNOSIS — D631 Anemia in chronic kidney disease: Secondary | ICD-10-CM | POA: Insufficient documentation

## 2019-06-26 MED ORDER — EPOETIN ALFA-EPBX 10000 UNIT/ML IJ SOLN: 10000.0000 [IU] | Freq: Once | INTRAMUSCULAR | Status: DC

## 2019-06-26 MED ORDER — CLONIDINE HCL 0.1 MG PO TABS: 0.1000 mg | ORAL_TABLET | Freq: Once | ORAL | Status: DC

## 2019-06-26 MED ORDER — CLONIDINE HCL 0.1 MG PO TABS
ORAL_TABLET | ORAL | Status: AC
  Administered 2019-06-26: 0.1 mg
  Filled 2019-06-26: qty 1

## 2019-06-26 NOTE — Progress Notes (Signed)
Pt here for first retacrit injection.  B/P 201/70 on arrival, rechecked and still 195/75 after sitting for 20 minutes.  Clonidine 0.1 given at 1125.  B/P at 1200 191/70.  Dr Joelyn Oms notified.  Order given to repeat Clonidine and to reschedule pt's shot.  Pt made aware of plan, given second dose of Clonidine and rescheduled for next week.

## 2019-07-03 ENCOUNTER — Ambulatory Visit (HOSPITAL_COMMUNITY)
Admission: RE | Admit: 2019-07-03 | Discharge: 2019-07-03 | Disposition: A | Discharge status: Home / Self Care | Payer: Medicaid Other | Source: Ambulatory Visit | Attending: Nephrology | Admitting: Nephrology

## 2019-07-03 ENCOUNTER — Other Ambulatory Visit: Payer: Self-pay

## 2019-07-03 DIAGNOSIS — N185 Chronic kidney disease, stage 5: Secondary | ICD-10-CM | POA: Diagnosis not present

## 2019-07-03 DIAGNOSIS — D631 Anemia in chronic kidney disease: Secondary | ICD-10-CM | POA: Insufficient documentation

## 2019-07-03 LAB — POCT HEMOGLOBIN-HEMACUE: Hemoglobin: 8.5 g/dL — ABNORMAL LOW (ref 12.0–15.0)

## 2019-07-03 MED ORDER — EPOETIN ALFA-EPBX 10000 UNIT/ML IJ SOLN
INTRAMUSCULAR | Status: AC
  Filled 2019-07-03: qty 1

## 2019-07-03 MED ORDER — EPOETIN ALFA-EPBX 10000 UNIT/ML IJ SOLN
10000.0000 [IU] | INTRAMUSCULAR | Status: DC
  Administered 2019-07-03: 10000 [IU] via SUBCUTANEOUS

## 2019-07-10 DIAGNOSIS — N185 Chronic kidney disease, stage 5: Secondary | ICD-10-CM | POA: Diagnosis not present

## 2019-07-17 ENCOUNTER — Encounter: Payer: Self-pay | Admitting: Internal Medicine

## 2019-07-17 ENCOUNTER — Ambulatory Visit (INDEPENDENT_AMBULATORY_CARE_PROVIDER_SITE_OTHER): Payer: BC Managed Care – PPO | Admitting: Internal Medicine

## 2019-07-17 ENCOUNTER — Other Ambulatory Visit: Payer: Self-pay

## 2019-07-17 VITALS — BP 150/82 | HR 88 | Temp 98.2°F | Ht 68.2 in | Wt 209.2 lb

## 2019-07-17 DIAGNOSIS — N184 Chronic kidney disease, stage 4 (severe): Secondary | ICD-10-CM

## 2019-07-17 DIAGNOSIS — Z0000 Encounter for general adult medical examination without abnormal findings: Secondary | ICD-10-CM | POA: Diagnosis not present

## 2019-07-17 DIAGNOSIS — Z794 Long term (current) use of insulin: Secondary | ICD-10-CM

## 2019-07-17 DIAGNOSIS — Z9114 Patient's other noncompliance with medication regimen: Secondary | ICD-10-CM | POA: Diagnosis not present

## 2019-07-17 DIAGNOSIS — I129 Hypertensive chronic kidney disease with stage 1 through stage 4 chronic kidney disease, or unspecified chronic kidney disease: Secondary | ICD-10-CM

## 2019-07-17 DIAGNOSIS — Z6831 Body mass index (BMI) 31.0-31.9, adult: Secondary | ICD-10-CM

## 2019-07-17 DIAGNOSIS — E1122 Type 2 diabetes mellitus with diabetic chronic kidney disease: Secondary | ICD-10-CM | POA: Diagnosis not present

## 2019-07-17 DIAGNOSIS — E6609 Other obesity due to excess calories: Secondary | ICD-10-CM | POA: Diagnosis not present

## 2019-07-17 DIAGNOSIS — Z8616 Personal history of COVID-19: Secondary | ICD-10-CM | POA: Diagnosis not present

## 2019-07-17 LAB — POCT URINALYSIS DIPSTICK
Bilirubin, UA: NEGATIVE
Blood, UA: NEGATIVE
Glucose, UA: POSITIVE — AB
Ketones, UA: NEGATIVE
Nitrite, UA: NEGATIVE
Protein, UA: POSITIVE — AB
Spec Grav, UA: 1.025 (ref 1.010–1.025)
Urobilinogen, UA: 0.2 E.U./dL
pH, UA: 5.5 (ref 5.0–8.0)

## 2019-07-17 LAB — POCT UA - MICROALBUMIN
Albumin/Creatinine Ratio, Urine, POC: >300
Creatinine, POC: 100 mg/dL
Microalbumin Ur, POC: 150 mg/L

## 2019-07-17 NOTE — Progress Notes (Signed)
This visit occurred during the SARS-CoV-2 public health emergency.  Safety protocols were in place, including screening questions prior to the visit, additional usage of staff PPE, and extensive cleaning of exam room while observing appropriate contact time as indicated for disinfecting solutions.  Subjective:     Patient ID: Samantha [name] , female    DOB: 10-21-1965 , 54 y.o.   MRN: [id number]   Chief Complaint  Patient presents with  . Annual Exam  . Hypertension  . Diabetes    HPI  She is here tdoay for a full physical exam. She is followed by Dr. Jennelle Human for her GYN exams. She has her mammograms performed here as well.   Hypertension This is a chronic problem. The current episode started more than 1 year ago. The problem has been gradually improving since onset. The problem is controlled. Pertinent negatives include no blurred vision, chest pain, palpitations or shortness of breath. Past treatments include ACE inhibitors and diuretics. The current treatment provides moderate improvement. Compliance problems include exercise.  Hypertensive end-organ damage includes kidney disease.  Diabetes She presents for her follow-up diabetic visit. She has type 2 diabetes mellitus. Her disease course has been stable. There are no hypoglycemic associated symptoms. Pertinent negatives for diabetes include no blurred vision and no chest pain. There are no hypoglycemic complications. Symptoms are improving. Diabetic complications include nephropathy. Risk factors for coronary artery disease include diabetes mellitus, dyslipidemia, hypertension, obesity and sedentary lifestyle. She is following a generally healthy diet. She participates in exercise intermittently. Her breakfast blood glucose range is generally 130-140 mg/dl. An ACE inhibitor/angiotensin II receptor blocker is contraindicated.     Past Medical History:  Diagnosis Date  . Anemia   . Chronic kidney disease (CKD), stage III (moderate)    . Hypertension   . Uncontrolled diabetes mellitus with microalbuminuria or microproteinuria      Family History  Problem Relation Age of Onset  . Hypertension Mother   . Diabetes Mother   . Kidney disease Mother   . Crohn's disease Mother   . Diabetes Father   . Hypertension Father   . Ulcers Father   . Kidney disease Father      Current Outpatient Medications:  .  amLODipine (NORVASC) 10 MG tablet, Take 1 tablet (10 mg total) by mouth daily., Disp: 30 tablet, Rfl: 0 .  atorvastatin (LIPITOR) 10 MG tablet, Take 10 mg by mouth daily at 6 PM. , Disp: , Rfl:  .  BYSTOLIC 10 MG tablet, Take 10 mg by mouth daily., Disp: , Rfl:  .  calcitRIOL (ROCALTROL) 0.25 MCG capsule, Take 0.75 mcg by mouth daily., Disp: , Rfl:  .  carvedilol (COREG) 12.5 MG tablet, Take 12.5 mg by mouth 2 (two) times daily with a meal., Disp: , Rfl:  .  fexofenadine (ALLEGRA) 180 MG tablet, Take 180 mg by mouth daily as needed for allergies or rhinitis., Disp: , Rfl:  .  furosemide (LASIX) 20 MG tablet, Take 20 mg by mouth daily., Disp: , Rfl: 3 .  hydrALAZINE (APRESOLINE) 50 MG tablet, Take by mouth., Disp: , Rfl:  .  IRON PO, Take 1 tablet by mouth daily. , Disp: , Rfl:  .  omeprazole (PRILOSEC) 40 MG capsule, TAKE 1 CAPSULE BY MOUTH 20 MINUTES BEFORE BREAKFAST, Disp: , Rfl:  .  OZEMPIC, 0.25 OR 0.5 MG/DOSE, 2 MG/1.5ML SOPN, INJECT 0.5MG  INTO SKIN ONCE WEEKLY ON SAME DAY EACH WK. ROTATE INJECTION SITE ABDOMEN/THIGH/UPPERARM, Disp: , Rfl:  .  TOUJEO  SOLOSTAR 300 UNIT/ML SOPN, INJECT 24 UNITS INTO THE SKIN AT BEDTIME., Disp: 4.5 pen, Rfl: 3 .  Continuous Blood Gluc Sensor (FREESTYLE LIBRE 14 DAY SENSOR) MISC, USE AS DIRECTED TO CHECK BLOOD SUGARS 4 TIMES PER DAY DX: E11.65 (Patient not taking: Reported on 07/17/2019), Disp: 4 each, Rfl: 2 .  ibuprofen (ADVIL,MOTRIN) 600 MG tablet, Take 1 tablet (600 mg total) by mouth every 6 (six) hours as needed. (Patient not taking: Reported on 03/17/2016), Disp: 60 tablet, Rfl: 3    No Known Allergies    The patient states she uses post menopausal status for birth control. Last LMP was No LMP recorded. Patient has had an ablation.. Negative for Dysmenorrhea Negative for: breast discharge, breast lump(s), breast pain and breast self exam. Associated symptoms include abnormal vaginal bleeding. Pertinent negatives include abnormal bleeding (hematology), anxiety, decreased libido, depression, difficulty falling sleep, dyspareunia, history of infertility, nocturia, sexual dysfunction, sleep disturbances, urinary incontinence, urinary urgency, vaginal discharge and vaginal itching. Diet regular.The patient states her exercise level is    . The patient's tobacco use is:  Social History   Tobacco Use  Smoking Status Never Smoker  Smokeless Tobacco Never Used  . She has been exposed to passive smoke. The patient's alcohol use is:  Social History   Substance and Sexual Activity  Alcohol Use No    Review of Systems  Constitutional: Negative.   HENT: Negative.   Eyes: Negative.  Negative for blurred vision.  Respiratory: Negative.  Negative for shortness of breath.   Cardiovascular: Negative.  Negative for chest pain and palpitations.  Endocrine: Negative.   Genitourinary: Negative.   Musculoskeletal: Negative.   Skin: Negative.   Allergic/Immunologic: Negative.   Neurological: Negative.   Hematological: Negative.   Psychiatric/Behavioral: Negative.      Today's Vitals   07/17/19 0933  BP: (!) 150/82  Pulse: 88  Temp: 98.2 F (36.8 C)  TempSrc: Oral  Weight: 209 lb 3.2 oz (94.9 kg)  Height: 5' 8.2" (1.732 m)   Body mass index is 31.62 kg/m.   Objective:  Physical Exam Vitals and nursing note reviewed.  Constitutional:      Appearance: Normal appearance.  HENT:     Head: Normocephalic and atraumatic.     Right Ear: Tympanic membrane, ear canal and external ear normal.     Left Ear: Tympanic membrane, ear canal and external ear normal.     Nose:      Comments: Deferred, masked    Mouth/Throat:     Comments: Deferred, masked Eyes:     Extraocular Movements: Extraocular movements intact.     Conjunctiva/sclera: Conjunctivae normal.     Pupils: Pupils are equal, round, and reactive to light.  Cardiovascular:     Rate and Rhythm: Normal rate and regular rhythm.     Pulses:          Dorsalis pedis pulses are 1+ on the right side and 1+ on the left side.     Heart sounds: Normal heart sounds.  Pulmonary:     Effort: Pulmonary effort is normal.     Breath sounds: Normal breath sounds.  Chest:     Breasts: Tanner Score is 5.        Right: Normal.        Left: Normal.  Abdominal:     General: Abdomen is flat. Bowel sounds are normal.     Palpations: Abdomen is soft.  Genitourinary:    Comments: deferred Musculoskeletal:  General: Normal range of motion.     Cervical back: Normal range of motion and neck supple.  Feet:     Right foot:     Protective Sensation: 5 sites tested. 5 sites sensed.     Skin integrity: Callus and dry skin present.     Toenail Condition: Right toenails are normal.     Left foot:     Protective Sensation: 5 sites tested. 5 sites sensed.     Skin integrity: Callus and dry skin present.     Toenail Condition: Left toenails are normal.  Skin:    General: Skin is warm and dry.  Neurological:     General: No focal deficit present.     Mental Status: She is alert and oriented to person, place, and time.  Psychiatric:        Mood and Affect: Mood normal.        Behavior: Behavior normal.         Assessment And Plan:     1. Routine general medical examination at health care facility  A full exam was performed.  Importance of monthly self breast exams was discussed with the patient. PATIENT IS ADVISED TO GET 30-45 MINUTES REGULAR EXERCISE NO LESS THAN FOUR TO FIVE DAYS PER WEEK - BOTH WEIGHTBEARING EXERCISES AND AEROBIC ARE RECOMMENDED.  SHE IS ADVISED TO FOLLOW A HEALTHY DIET WITH AT LEAST SIX  FRUITS/VEGGIES PER DAY, DECREASE INTAKE OF RED MEAT, AND TO INCREASE FISH INTAKE TO TWO DAYS PER WEEK.  MEATS/FISH SHOULD NOT BE FRIED, BAKED OR BROILED IS PREFERABLE.  I SUGGEST WEARING SPF 50 SUNSCREEN ON EXPOSED PARTS AND ESPECIALLY WHEN IN THE DIRECT SUNLIGHT FOR AN EXTENDED PERIOD OF TIME.  PLEASE AVOID FAST FOOD RESTAURANTS AND INCREASE YOUR WATER INTAKE.  - CBC - Lipid panel - Hemoglobin A1c - Liver Profile  2. Hypertensive nephropathy  Uncontrolled, admits she sometimes misses her medication. EKG performed, NSR w/ HR 66; possible LVH; nonspecific ST depression + neg T waves. She will continue with current meds. For now. Importance of adequate BP/BS control, along with adequate hydration to prevent progression of CKD was discussed with the patient.   3. Type 2 diabetes mellitus with stage 4 chronic kidney disease, with long-term current use of insulin ([place])  Diabetic foot exam was performed. I DISCUSSED WITH THE PATIENT AT LENGTH REGARDING THE GOALS OF GLYCEMIC CONTROL AND POSSIBLE LONG-TERM COMPLICATIONS.  I  ALSO STRESSED THE IMPORTANCE OF COMPLIANCE WITH HOME GLUCOSE MONITORING, DIETARY RESTRICTIONS INCLUDING AVOIDANCE OF SUGARY DRINKS/PROCESSED FOODS,  ALONG WITH REGULAR EXERCISE.  I  ALSO STRESSED THE IMPORTANCE OF ANNUAL EYE EXAMS, SELF FOOT CARE AND COMPLIANCE WITH OFFICE VISITS.  - POCT Urinalysis Dipstick (81002) - POCT UA - Microalbumin  4. Class 1 obesity due to excess calories with serious comorbidity and body mass index (BMI) of 31.0 to 31.9 in adult  She is encouraged to strive for BMI less than 27 to decrease cardiac risk. Advised to exercise for at least 30 minutes five days per week.   5. Personal history of covid-19  She is encouraged to get COVID vaccine.    Samantha Greenland, MD    THE PATIENT IS ENCOURAGED TO PRACTICE SOCIAL DISTANCING DUE TO THE COVID-19 PANDEMIC.

## 2019-07-17 NOTE — Patient Instructions (Signed)
Health Maintenance, Female Adopting a healthy lifestyle and getting preventive care are important in promoting health and wellness. Ask your health care provider about:  The right schedule for you to have regular tests and exams.  Things you can do on your own to prevent diseases and keep yourself healthy. What should I know about diet, weight, and exercise? Eat a healthy diet   Eat a diet that includes plenty of vegetables, fruits, low-fat dairy products, and lean protein.  Do not eat a lot of foods that are high in solid fats, added sugars, or sodium. Maintain a healthy weight Body mass index (BMI) is used to identify weight problems. It estimates body fat based on height and weight. Your health care provider can help determine your BMI and help you achieve or maintain a healthy weight. Get regular exercise Get regular exercise. This is one of the most important things you can do for your health. Most adults should:  Exercise for at least 150 minutes each week. The exercise should increase your heart rate and make you sweat (moderate-intensity exercise).  Do strengthening exercises at least twice a week. This is in addition to the moderate-intensity exercise.  Spend less time sitting. Even light physical activity can be beneficial. Watch cholesterol and blood lipids Have your blood tested for lipids and cholesterol at 53 years of age, then have this test every 5 years. Have your cholesterol levels checked more often if:  Your lipid or cholesterol levels are high.  You are older than 53 years of age.  You are at high risk for heart disease. What should I know about cancer screening? Depending on your health history and family history, you may need to have cancer screening at various ages. This may include screening for:  Breast cancer.  Cervical cancer.  Colorectal cancer.  Skin cancer.  Lung cancer. What should I know about heart disease, diabetes, and high blood  pressure? Blood pressure and heart disease  High blood pressure causes heart disease and increases the risk of stroke. This is more likely to develop in people who have high blood pressure readings, are of African descent, or are overweight.  Have your blood pressure checked: ? Every 3-5 years if you are 18-39 years of age. ? Every year if you are 40 years old or older. Diabetes Have regular diabetes screenings. This checks your fasting blood sugar level. Have the screening done:  Once every three years after age 40 if you are at a normal weight and have a low risk for diabetes.  More often and at a younger age if you are overweight or have a high risk for diabetes. What should I know about preventing infection? Hepatitis B If you have a higher risk for hepatitis B, you should be screened for this virus. Talk with your health care provider to find out if you are at risk for hepatitis B infection. Hepatitis C Testing is recommended for:  Everyone born from 1945 through 1965.  Anyone with known risk factors for hepatitis C. Sexually transmitted infections (STIs)  Get screened for STIs, including gonorrhea and chlamydia, if: ? You are sexually active and are younger than 53 years of age. ? You are older than 53 years of age and your health care provider tells you that you are at risk for this type of infection. ? Your sexual activity has changed since you were last screened, and you are at increased risk for chlamydia or gonorrhea. Ask your health care provider if   you are at risk.  Ask your health care provider about whether you are at high risk for HIV. Your health care provider may recommend a prescription medicine to help prevent HIV infection. If you choose to take medicine to prevent HIV, you should first get tested for HIV. You should then be tested every 3 months for as long as you are taking the medicine. Pregnancy  If you are about to stop having your period (premenopausal) and  you may become pregnant, seek counseling before you get pregnant.  Take 400 to 800 micrograms (mcg) of folic acid every day if you become pregnant.  Ask for birth control (contraception) if you want to prevent pregnancy. Osteoporosis and menopause Osteoporosis is a disease in which the bones lose minerals and strength with aging. This can result in bone fractures. If you are 65 years old or older, or if you are at risk for osteoporosis and fractures, ask your health care provider if you should:  Be screened for bone loss.  Take a calcium or vitamin D supplement to lower your risk of fractures.  Be given hormone replacement therapy (HRT) to treat symptoms of menopause. Follow these instructions at home: Lifestyle  Do not use any products that contain nicotine or tobacco, such as cigarettes, e-cigarettes, and chewing tobacco. If you need help quitting, ask your health care provider.  Do not use street drugs.  Do not share needles.  Ask your health care provider for help if you need support or information about quitting drugs. Alcohol use  Do not drink alcohol if: ? Your health care provider tells you not to drink. ? You are pregnant, may be pregnant, or are planning to become pregnant.  If you drink alcohol: ? Limit how much you use to 0-1 drink a day. ? Limit intake if you are breastfeeding.  Be aware of how much alcohol is in your drink. In the [place] one drink equals one 12 oz bottle of beer (355 mL), one 5 oz glass of wine (148 mL), or one 1 oz glass of hard liquor (44 mL). General instructions  Schedule regular health, dental, and eye exams.  Stay current with your vaccines.  Tell your health care provider if: ? You often feel depressed. ? You have ever been abused or do not feel safe at home. Summary  Adopting a healthy lifestyle and getting preventive care are important in promoting health and wellness.  Follow your health care provider's instructions about healthy  diet, exercising, and getting tested or screened for diseases.  Follow your health care provider's instructions on monitoring your cholesterol and blood pressure. This information is not intended to replace advice given to you by your health care provider. Make sure you discuss any questions you have with your health care provider. Document Revised: 04/19/2018 Document Reviewed: 04/19/2018 Elsevier Patient Education  2020 [employer]  

## 2019-07-18 LAB — HEMOGLOBIN A1C
Est. average glucose Bld gHb Est-mCnc: 120 mg/dL
Hgb A1c MFr Bld: 5.8 % — ABNORMAL HIGH (ref 4.8–5.6)

## 2019-07-18 LAB — CBC
Hematocrit: 26.1 % — ABNORMAL LOW (ref 34.0–46.6)
Hemoglobin: 8.2 g/dL — ABNORMAL LOW (ref 11.1–15.9)
MCH: 27.2 pg (ref 26.6–33.0)
MCHC: 31.4 g/dL — ABNORMAL LOW (ref 31.5–35.7)
MCV: 86 fL (ref 79–97)
Platelets: 375 10*3/uL (ref 150–450)
RBC: 3.02 x10E6/uL — ABNORMAL LOW (ref 3.77–5.28)
RDW: 14.4 % (ref 11.7–15.4)
WBC: 9.9 10*3/uL (ref 3.4–10.8)

## 2019-07-18 LAB — LIPID PANEL
Chol/HDL Ratio: 3 ratio (ref 0.0–4.4)
Cholesterol, Total: 146 mg/dL (ref 100–199)
HDL: 49 mg/dL (ref >39)
LDL Chol Calc (NIH): 80 mg/dL (ref 0–99)
Triglycerides: 90 mg/dL (ref 0–149)
VLDL Cholesterol Cal: 17 mg/dL (ref 5–40)

## 2019-07-18 LAB — HEPATIC FUNCTION PANEL
ALT: 8 IU/L (ref 0–32)
AST: 9 IU/L (ref 0–40)
Albumin: 3 g/dL — ABNORMAL LOW (ref 3.8–4.9)
Alkaline Phosphatase: 63 IU/L (ref 39–117)
Bilirubin Total: <0.2 mg/dL (ref 0.0–1.2)
Bilirubin, Direct: 0.06 mg/dL (ref 0.00–0.40)
Total Protein: 6.6 g/dL (ref 6.0–8.5)

## 2019-07-19 DIAGNOSIS — E119 Type 2 diabetes mellitus without complications: Secondary | ICD-10-CM | POA: Diagnosis not present

## 2019-07-19 DIAGNOSIS — Z7682 Awaiting organ transplant status: Secondary | ICD-10-CM | POA: Diagnosis not present

## 2019-07-19 DIAGNOSIS — R809 Proteinuria, unspecified: Secondary | ICD-10-CM | POA: Diagnosis not present

## 2019-07-19 DIAGNOSIS — N185 Chronic kidney disease, stage 5: Secondary | ICD-10-CM | POA: Diagnosis not present

## 2019-07-19 DIAGNOSIS — I120 Hypertensive chronic kidney disease with stage 5 chronic kidney disease or end stage renal disease: Secondary | ICD-10-CM | POA: Diagnosis not present

## 2019-07-19 DIAGNOSIS — N2581 Secondary hyperparathyroidism of renal origin: Secondary | ICD-10-CM | POA: Diagnosis not present

## 2019-07-19 DIAGNOSIS — D631 Anemia in chronic kidney disease: Secondary | ICD-10-CM | POA: Diagnosis not present

## 2019-07-24 ENCOUNTER — Encounter (HOSPITAL_COMMUNITY): Payer: Medicaid Other

## 2019-07-31 ENCOUNTER — Ambulatory Visit (HOSPITAL_COMMUNITY)
Admission: RE | Admit: 2019-07-31 | Discharge: 2019-07-31 | Disposition: A | Discharge status: Home / Self Care | Payer: Medicaid Other | Source: Ambulatory Visit | Attending: Nephrology | Admitting: Nephrology

## 2019-07-31 ENCOUNTER — Other Ambulatory Visit: Payer: Self-pay

## 2019-07-31 DIAGNOSIS — D631 Anemia in chronic kidney disease: Secondary | ICD-10-CM | POA: Diagnosis not present

## 2019-07-31 DIAGNOSIS — N185 Chronic kidney disease, stage 5: Secondary | ICD-10-CM | POA: Diagnosis not present

## 2019-07-31 LAB — RENAL FUNCTION PANEL
Albumin: 2.5 g/dL — ABNORMAL LOW (ref 3.5–5.0)
Anion gap: 14 (ref 5–15)
BUN: 96 mg/dL — ABNORMAL HIGH (ref 6–20)
CO2: 22 mmol/L (ref 22–32)
Calcium: 9.5 mg/dL (ref 8.9–10.3)
Chloride: 106 mmol/L (ref 98–111)
Creatinine, Ser: 8.88 mg/dL — ABNORMAL HIGH (ref 0.44–1.00)
GFR calc Af Amer: 5 mL/min — ABNORMAL LOW (ref >60)
GFR calc non Af Amer: 5 mL/min — ABNORMAL LOW (ref >60)
Glucose, Bld: 113 mg/dL — ABNORMAL HIGH (ref 70–99)
Phosphorus: 9.4 mg/dL — ABNORMAL HIGH (ref 2.5–4.6)
Potassium: 4.9 mmol/L (ref 3.5–5.1)
Sodium: 142 mmol/L (ref 135–145)

## 2019-07-31 LAB — IRON AND TIBC
Iron: 32 ug/dL (ref 28–170)
Saturation Ratios: 15 % (ref 10.4–31.8)
TIBC: 213 ug/dL — ABNORMAL LOW (ref 250–450)
UIBC: 181 ug/dL

## 2019-07-31 LAB — POCT HEMOGLOBIN-HEMACUE: Hemoglobin: 7.6 g/dL — ABNORMAL LOW (ref 12.0–15.0)

## 2019-07-31 MED ORDER — EPOETIN ALFA-EPBX 10000 UNIT/ML IJ SOLN: 10000.0000 [IU] | Freq: Once | INTRAMUSCULAR | Status: DC

## 2019-07-31 MED ORDER — EPOETIN ALFA-EPBX 10000 UNIT/ML IJ SOLN
INTRAMUSCULAR | Status: AC
  Filled 2019-07-31: qty 1

## 2019-07-31 MED ORDER — EPOETIN ALFA-EPBX 10000 UNIT/ML IJ SOLN
20000.0000 [IU] | Freq: Once | INTRAMUSCULAR | Status: AC
  Administered 2019-07-31: 20000 [IU] via SUBCUTANEOUS

## 2019-07-31 NOTE — Progress Notes (Signed)
Hemocue today 7.6, 8.5 last month.  Only patient's second injection with [place]. Reported to Museum/gallery conservator at [facility]. Awaiting return call.

## 2019-07-31 NOTE — Progress Notes (Signed)
New orders received to increase dose of retacrit

## 2019-08-01 LAB — PTH, INTACT AND CALCIUM
Calcium, Total (PTH): 9.2 mg/dL (ref 8.7–10.2)
PTH: 85 pg/mL — ABNORMAL HIGH (ref 15–65)

## 2019-08-02 DIAGNOSIS — Z005 Encounter for examination of potential donor of organ and tissue: Secondary | ICD-10-CM | POA: Diagnosis not present

## 2019-08-14 ENCOUNTER — Encounter (HOSPITAL_COMMUNITY): Payer: Medicaid Other

## 2019-08-14 DIAGNOSIS — N185 Chronic kidney disease, stage 5: Secondary | ICD-10-CM | POA: Diagnosis not present

## 2019-08-15 DIAGNOSIS — E113513 Type 2 diabetes mellitus with proliferative diabetic retinopathy with macular edema, bilateral: Secondary | ICD-10-CM | POA: Diagnosis not present

## 2019-08-15 DIAGNOSIS — H4321 Crystalline deposits in vitreous body, right eye: Secondary | ICD-10-CM | POA: Diagnosis not present

## 2019-08-15 DIAGNOSIS — H3582 Retinal ischemia: Secondary | ICD-10-CM | POA: Diagnosis not present

## 2019-08-15 DIAGNOSIS — H35372 Puckering of macula, left eye: Secondary | ICD-10-CM | POA: Diagnosis not present

## 2019-08-22 DIAGNOSIS — N189 Chronic kidney disease, unspecified: Secondary | ICD-10-CM | POA: Diagnosis not present

## 2019-08-27 ENCOUNTER — Other Ambulatory Visit (HOSPITAL_COMMUNITY): Payer: Self-pay | Admitting: *Deleted

## 2019-08-27 DIAGNOSIS — Z112 Encounter for screening for other bacterial diseases: Secondary | ICD-10-CM | POA: Diagnosis not present

## 2019-08-27 DIAGNOSIS — E1122 Type 2 diabetes mellitus with diabetic chronic kidney disease: Secondary | ICD-10-CM | POA: Diagnosis not present

## 2019-08-27 DIAGNOSIS — N185 Chronic kidney disease, stage 5: Secondary | ICD-10-CM | POA: Diagnosis not present

## 2019-08-27 DIAGNOSIS — E785 Hyperlipidemia, unspecified: Secondary | ICD-10-CM | POA: Diagnosis not present

## 2019-08-27 DIAGNOSIS — Z113 Encounter for screening for infections with a predominantly sexual mode of transmission: Secondary | ICD-10-CM | POA: Diagnosis not present

## 2019-08-27 DIAGNOSIS — Z794 Long term (current) use of insulin: Secondary | ICD-10-CM | POA: Diagnosis not present

## 2019-08-27 DIAGNOSIS — R911 Solitary pulmonary nodule: Secondary | ICD-10-CM | POA: Diagnosis not present

## 2019-08-27 DIAGNOSIS — N186 End stage renal disease: Secondary | ICD-10-CM | POA: Diagnosis not present

## 2019-08-27 DIAGNOSIS — Z0184 Encounter for antibody response examination: Secondary | ICD-10-CM | POA: Diagnosis not present

## 2019-08-27 DIAGNOSIS — R1909 Other intra-abdominal and pelvic swelling, mass and lump: Secondary | ICD-10-CM | POA: Diagnosis not present

## 2019-08-27 DIAGNOSIS — R609 Edema, unspecified: Secondary | ICD-10-CM | POA: Diagnosis not present

## 2019-08-27 DIAGNOSIS — Z01818 Encounter for other preprocedural examination: Secondary | ICD-10-CM | POA: Diagnosis not present

## 2019-08-27 DIAGNOSIS — I120 Hypertensive chronic kidney disease with stage 5 chronic kidney disease or end stage renal disease: Secondary | ICD-10-CM | POA: Diagnosis not present

## 2019-08-27 DIAGNOSIS — Z111 Encounter for screening for respiratory tuberculosis: Secondary | ICD-10-CM | POA: Diagnosis not present

## 2019-08-27 DIAGNOSIS — I708 Atherosclerosis of other arteries: Secondary | ICD-10-CM | POA: Diagnosis not present

## 2019-08-28 ENCOUNTER — Encounter (HOSPITAL_COMMUNITY): Payer: Self-pay

## 2019-08-28 ENCOUNTER — Other Ambulatory Visit: Payer: Self-pay

## 2019-08-28 ENCOUNTER — Ambulatory Visit (HOSPITAL_COMMUNITY): Admission: RE | Admit: 2019-08-28 | Payer: Medicaid Other | Source: Ambulatory Visit

## 2019-08-30 DIAGNOSIS — R9431 Abnormal electrocardiogram [ECG] [EKG]: Secondary | ICD-10-CM | POA: Diagnosis not present

## 2019-08-30 DIAGNOSIS — Z005 Encounter for examination of potential donor of organ and tissue: Secondary | ICD-10-CM | POA: Diagnosis not present

## 2019-08-30 DIAGNOSIS — N200 Calculus of kidney: Secondary | ICD-10-CM | POA: Diagnosis not present

## 2019-08-30 DIAGNOSIS — K7689 Other specified diseases of liver: Secondary | ICD-10-CM | POA: Diagnosis not present

## 2019-08-30 DIAGNOSIS — R140 Abdominal distension (gaseous): Secondary | ICD-10-CM | POA: Diagnosis not present

## 2019-09-04 ENCOUNTER — Other Ambulatory Visit: Payer: Self-pay

## 2019-09-04 ENCOUNTER — Ambulatory Visit (HOSPITAL_COMMUNITY)
Admission: RE | Admit: 2019-09-04 | Discharge: 2019-09-04 | Disposition: A | Discharge status: Home / Self Care | Payer: Medicaid Other | Source: Ambulatory Visit | Attending: Nephrology | Admitting: Nephrology

## 2019-09-04 DIAGNOSIS — D631 Anemia in chronic kidney disease: Secondary | ICD-10-CM | POA: Diagnosis not present

## 2019-09-04 DIAGNOSIS — R809 Proteinuria, unspecified: Secondary | ICD-10-CM | POA: Diagnosis not present

## 2019-09-04 DIAGNOSIS — N185 Chronic kidney disease, stage 5: Secondary | ICD-10-CM | POA: Diagnosis not present

## 2019-09-04 DIAGNOSIS — I120 Hypertensive chronic kidney disease with stage 5 chronic kidney disease or end stage renal disease: Secondary | ICD-10-CM | POA: Diagnosis not present

## 2019-09-04 LAB — RENAL FUNCTION PANEL
Albumin: 2.7 g/dL — ABNORMAL LOW (ref 3.5–5.0)
Anion gap: 13 (ref 5–15)
BUN: 108 mg/dL — ABNORMAL HIGH (ref 6–20)
CO2: 21 mmol/L — ABNORMAL LOW (ref 22–32)
Calcium: 9.8 mg/dL (ref 8.9–10.3)
Chloride: 106 mmol/L (ref 98–111)
Creatinine, Ser: 8.83 mg/dL — ABNORMAL HIGH (ref 0.44–1.00)
GFR calc Af Amer: 5 mL/min — ABNORMAL LOW (ref >60)
GFR calc non Af Amer: 5 mL/min — ABNORMAL LOW (ref >60)
Glucose, Bld: 120 mg/dL — ABNORMAL HIGH (ref 70–99)
Phosphorus: 9.4 mg/dL — ABNORMAL HIGH (ref 2.5–4.6)
Potassium: 4.8 mmol/L (ref 3.5–5.1)
Sodium: 140 mmol/L (ref 135–145)

## 2019-09-04 LAB — POCT HEMOGLOBIN-HEMACUE: Hemoglobin: 7.8 g/dL — ABNORMAL LOW (ref 12.0–15.0)

## 2019-09-04 MED ORDER — EPOETIN ALFA-EPBX 10000 UNIT/ML IJ SOLN
INTRAMUSCULAR | Status: AC
  Filled 2019-09-04: qty 2

## 2019-09-04 MED ORDER — SODIUM CHLORIDE 0.9 % IV SOLN
510.0000 mg | INTRAVENOUS | Status: DC
  Administered 2019-09-04: 510 mg via INTRAVENOUS
  Filled 2019-09-04: qty 17

## 2019-09-04 MED ORDER — EPOETIN ALFA-EPBX 10000 UNIT/ML IJ SOLN
20000.0000 [IU] | INTRAMUSCULAR | Status: DC
  Administered 2019-09-04: 20000 [IU] via SUBCUTANEOUS

## 2019-09-04 NOTE — Progress Notes (Signed)
Notified Stacy at [facility] of HGB 7.8. Per Dr. Joelyn Oms okay to give retacrit as ordered.

## 2019-09-05 DIAGNOSIS — N185 Chronic kidney disease, stage 5: Secondary | ICD-10-CM | POA: Diagnosis not present

## 2019-09-05 DIAGNOSIS — Z794 Long term (current) use of insulin: Secondary | ICD-10-CM | POA: Diagnosis not present

## 2019-09-05 DIAGNOSIS — E1122 Type 2 diabetes mellitus with diabetic chronic kidney disease: Secondary | ICD-10-CM | POA: Diagnosis not present

## 2019-09-05 DIAGNOSIS — I120 Hypertensive chronic kidney disease with stage 5 chronic kidney disease or end stage renal disease: Secondary | ICD-10-CM | POA: Diagnosis not present

## 2019-09-05 LAB — PTH, INTACT AND CALCIUM
Calcium, Total (PTH): 8.9 mg/dL (ref 8.7–10.2)
PTH: 49 pg/mL (ref 15–65)

## 2019-09-11 ENCOUNTER — Encounter (HOSPITAL_COMMUNITY): Payer: Medicaid Other

## 2019-09-13 ENCOUNTER — Other Ambulatory Visit: Payer: Self-pay | Admitting: Internal Medicine

## 2019-09-17 DIAGNOSIS — R601 Generalized edema: Secondary | ICD-10-CM | POA: Diagnosis not present

## 2019-09-17 DIAGNOSIS — R911 Solitary pulmonary nodule: Secondary | ICD-10-CM | POA: Diagnosis not present

## 2019-09-17 DIAGNOSIS — N289 Disorder of kidney and ureter, unspecified: Secondary | ICD-10-CM | POA: Diagnosis not present

## 2019-09-17 DIAGNOSIS — I071 Rheumatic tricuspid insufficiency: Secondary | ICD-10-CM | POA: Diagnosis not present

## 2019-09-17 DIAGNOSIS — I340 Nonrheumatic mitral (valve) insufficiency: Secondary | ICD-10-CM | POA: Diagnosis not present

## 2019-09-17 DIAGNOSIS — I358 Other nonrheumatic aortic valve disorders: Secondary | ICD-10-CM | POA: Diagnosis not present

## 2019-09-17 DIAGNOSIS — Z01818 Encounter for other preprocedural examination: Secondary | ICD-10-CM | POA: Diagnosis not present

## 2019-09-17 DIAGNOSIS — I371 Nonrheumatic pulmonary valve insufficiency: Secondary | ICD-10-CM | POA: Diagnosis not present

## 2019-09-17 DIAGNOSIS — N2889 Other specified disorders of kidney and ureter: Secondary | ICD-10-CM | POA: Diagnosis not present

## 2019-09-17 DIAGNOSIS — I6523 Occlusion and stenosis of bilateral carotid arteries: Secondary | ICD-10-CM | POA: Diagnosis not present

## 2019-09-17 DIAGNOSIS — I313 Pericardial effusion (noninflammatory): Secondary | ICD-10-CM | POA: Diagnosis not present

## 2019-09-17 DIAGNOSIS — I2510 Atherosclerotic heart disease of native coronary artery without angina pectoris: Secondary | ICD-10-CM | POA: Diagnosis not present

## 2019-09-17 DIAGNOSIS — Z01810 Encounter for preprocedural cardiovascular examination: Secondary | ICD-10-CM | POA: Diagnosis not present

## 2019-09-17 DIAGNOSIS — N186 End stage renal disease: Secondary | ICD-10-CM | POA: Diagnosis not present

## 2019-09-17 DIAGNOSIS — R93421 Abnormal radiologic findings on diagnostic imaging of right kidney: Secondary | ICD-10-CM | POA: Diagnosis not present

## 2019-09-18 ENCOUNTER — Other Ambulatory Visit: Payer: Self-pay

## 2019-09-18 ENCOUNTER — Encounter: Payer: Self-pay | Admitting: Nephrology

## 2019-09-18 ENCOUNTER — Encounter (HOSPITAL_COMMUNITY)
Admission: RE | Admit: 2019-09-18 | Discharge: 2019-09-18 | Disposition: A | Discharge status: Home / Self Care | Payer: Medicaid Other | Source: Ambulatory Visit | Attending: Nephrology | Admitting: Nephrology

## 2019-09-18 DIAGNOSIS — D631 Anemia in chronic kidney disease: Secondary | ICD-10-CM | POA: Diagnosis not present

## 2019-09-18 DIAGNOSIS — N185 Chronic kidney disease, stage 5: Secondary | ICD-10-CM | POA: Diagnosis not present

## 2019-09-18 LAB — POCT HEMOGLOBIN-HEMACUE: Hemoglobin: 8.6 g/dL — ABNORMAL LOW (ref 12.0–15.0)

## 2019-09-18 MED ORDER — SODIUM CHLORIDE 0.9 % IV SOLN
510.0000 mg | INTRAVENOUS | Status: DC
  Administered 2019-09-18: 510 mg via INTRAVENOUS
  Filled 2019-09-18: qty 17

## 2019-09-18 MED ORDER — EPOETIN ALFA-EPBX 10000 UNIT/ML IJ SOLN: 20000.0000 [IU] | INTRAMUSCULAR | Status: DC

## 2019-09-18 MED ORDER — EPOETIN ALFA-EPBX 10000 UNIT/ML IJ SOLN
INTRAMUSCULAR | Status: AC
  Administered 2019-09-18: 20000 [IU] via SUBCUTANEOUS
  Filled 2019-09-18: qty 2

## 2019-09-20 DIAGNOSIS — Z833 Family history of diabetes mellitus: Secondary | ICD-10-CM | POA: Diagnosis not present

## 2019-09-20 DIAGNOSIS — E1122 Type 2 diabetes mellitus with diabetic chronic kidney disease: Secondary | ICD-10-CM | POA: Diagnosis not present

## 2019-10-02 ENCOUNTER — Encounter (HOSPITAL_COMMUNITY): Payer: Medicaid Other

## 2019-10-02 ENCOUNTER — Ambulatory Visit (HOSPITAL_COMMUNITY)
Admission: RE | Admit: 2019-10-02 | Discharge: 2019-10-02 | Disposition: A | Discharge status: Home / Self Care | Payer: Medicaid Other | Source: Ambulatory Visit | Attending: Nephrology | Admitting: Nephrology

## 2019-10-02 ENCOUNTER — Other Ambulatory Visit: Payer: Self-pay

## 2019-10-02 DIAGNOSIS — N185 Chronic kidney disease, stage 5: Secondary | ICD-10-CM | POA: Insufficient documentation

## 2019-10-02 DIAGNOSIS — D631 Anemia in chronic kidney disease: Secondary | ICD-10-CM | POA: Diagnosis not present

## 2019-10-02 LAB — RENAL FUNCTION PANEL
Albumin: 2.6 g/dL — ABNORMAL LOW (ref 3.5–5.0)
Anion gap: 11 (ref 5–15)
BUN: 106 mg/dL — ABNORMAL HIGH (ref 6–20)
CO2: 21 mmol/L — ABNORMAL LOW (ref 22–32)
Calcium: 9.3 mg/dL (ref 8.9–10.3)
Chloride: 110 mmol/L (ref 98–111)
Creatinine, Ser: 8.28 mg/dL — ABNORMAL HIGH (ref 0.44–1.00)
GFR calc Af Amer: 6 mL/min — ABNORMAL LOW (ref >60)
GFR calc non Af Amer: 5 mL/min — ABNORMAL LOW (ref >60)
Glucose, Bld: 122 mg/dL — ABNORMAL HIGH (ref 70–99)
Phosphorus: 9 mg/dL — ABNORMAL HIGH (ref 2.5–4.6)
Potassium: 5 mmol/L (ref 3.5–5.1)
Sodium: 142 mmol/L (ref 135–145)

## 2019-10-02 LAB — IRON AND TIBC
Iron: 44 ug/dL (ref 28–170)
Saturation Ratios: 22 % (ref 10.4–31.8)
TIBC: 203 ug/dL — ABNORMAL LOW (ref 250–450)
UIBC: 159 ug/dL

## 2019-10-02 LAB — FERRITIN: Ferritin: 140 ng/mL (ref 11–307)

## 2019-10-02 LAB — POCT HEMOGLOBIN-HEMACUE: Hemoglobin: 9.2 g/dL — ABNORMAL LOW (ref 12.0–15.0)

## 2019-10-02 MED ORDER — EPOETIN ALFA-EPBX 10000 UNIT/ML IJ SOLN
INTRAMUSCULAR | Status: AC
  Administered 2019-10-02: 20000 [IU] via SUBCUTANEOUS
  Filled 2019-10-02: qty 2

## 2019-10-02 MED ORDER — EPOETIN ALFA-EPBX 10000 UNIT/ML IJ SOLN: 20000.0000 [IU] | INTRAMUSCULAR | Status: DC

## 2019-10-03 LAB — PTH, INTACT AND CALCIUM
Calcium, Total (PTH): 9.1 mg/dL (ref 8.7–10.2)
PTH: 127 pg/mL — ABNORMAL HIGH (ref 15–65)

## 2019-10-11 DIAGNOSIS — R911 Solitary pulmonary nodule: Secondary | ICD-10-CM | POA: Diagnosis not present

## 2019-10-15 ENCOUNTER — Other Ambulatory Visit: Payer: Self-pay

## 2019-10-15 ENCOUNTER — Ambulatory Visit (INDEPENDENT_AMBULATORY_CARE_PROVIDER_SITE_OTHER): Payer: BC Managed Care – PPO | Admitting: Nurse Practitioner

## 2019-10-15 ENCOUNTER — Encounter: Payer: Self-pay | Admitting: Nurse Practitioner

## 2019-10-15 ENCOUNTER — Other Ambulatory Visit (HOSPITAL_COMMUNITY): Payer: Self-pay | Admitting: *Deleted

## 2019-10-15 VITALS — BP 142/78 | HR 64 | Temp 97.7°F | Ht 68.2 in | Wt 198.4 lb

## 2019-10-15 DIAGNOSIS — R0789 Other chest pain: Secondary | ICD-10-CM

## 2019-10-15 DIAGNOSIS — I129 Hypertensive chronic kidney disease with stage 1 through stage 4 chronic kidney disease, or unspecified chronic kidney disease: Secondary | ICD-10-CM | POA: Diagnosis not present

## 2019-10-15 DIAGNOSIS — E1122 Type 2 diabetes mellitus with diabetic chronic kidney disease: Secondary | ICD-10-CM

## 2019-10-15 DIAGNOSIS — N184 Chronic kidney disease, stage 4 (severe): Secondary | ICD-10-CM

## 2019-10-15 DIAGNOSIS — Z794 Long term (current) use of insulin: Secondary | ICD-10-CM | POA: Diagnosis not present

## 2019-10-15 NOTE — Progress Notes (Addendum)
This visit occurred during the SARS-CoV-2 public health emergency.  Safety protocols were in place, including screening questions prior to the visit, additional usage of staff PPE, and extensive cleaning of exam room while observing appropriate contact time as indicated for disinfecting solutions.  Subjective:     Patient ID: Samantha [name] , female    DOB: [birth year] , 54 y.o.   MRN: [id number]   Chief Complaint  Patient presents with  . Hypertension    patient stated her blood pressure has been elevated   . Diabetes    her blood sugar has been running high she stated it got has high as 300    HPI  She presents today with elevated BP and CBG. She noticed that her numbers were elevated in the evening. She took a nap and when she woke up she felt like she was going to pass out and had cold sweats and GI upset. She had a similar situation occurred earlier this year with similar presentation. She stopped her CBG medication and her glucose following this event 289. She felt that her last AIC result indicated control and therefore she stopped taking her medications.She reports that she doesn't eat a lot and drinks very little at times. She drank some apple juice and ate some soup after this last episode with some relief. Her BP was 209/93 and 223/104.  after this event as well. She is taking all of her BP medications as directed. She took an amlodipine and hydralazine. She did endorse some heaviness in the chest during this episode. Her CBG this morning is 126.   Wt Readings from Last 3 Encounters: 10/15/19 : 198 lb 6.4 oz (90 kg) 07/17/19 : 209 lb 3.2 oz (94.9 kg) 12/28/18 : 216 lb 3.2 oz (98.1 kg)  The only medication she stopped was diabetes medication. Ate philly cheese steak. She took Toujeo 20 units.  In the process   Hypertension This is a chronic problem. The current episode started more than 1 year ago. The problem has been gradually improving since onset. The problem is controlled.  Pertinent negatives include no blurred vision or chest pain. Past treatments include ACE inhibitors and diuretics. The current treatment provides moderate improvement. Compliance problems include exercise.  Hypertensive end-organ damage includes kidney disease.  Diabetes She presents for her follow-up diabetic visit. She has type 2 diabetes mellitus. Her disease course has been stable. There are no hypoglycemic associated symptoms. Pertinent negatives for hypoglycemia include no pallor. Pertinent negatives for diabetes include no blurred vision and no chest pain. There are no hypoglycemic complications. Symptoms are improving. Diabetic complications include nephropathy. Risk factors for coronary artery disease include diabetes mellitus, dyslipidemia, hypertension, obesity and sedentary lifestyle. She is following a generally healthy diet. She participates in exercise intermittently. Her breakfast blood glucose range is generally 130-140 mg/dl. An ACE inhibitor/angiotensin II receptor blocker is contraindicated.     Past Medical History:  Diagnosis Date  . Anemia in chronic kidney disease (CKD)   . Chronic kidney disease, stage V ([place])   . Hypertension   . Uncontrolled diabetes mellitus with microalbuminuria or microproteinuria      Family History  Problem Relation Age of Onset  . Hypertension Mother   . Diabetes Mother   . Kidney disease Mother   . Crohn's disease Mother   . Diabetes Father   . Hypertension Father   . Ulcers Father   . Kidney disease Father      Current Outpatient Medications:  .  amLODipine (NORVASC) 10 MG tablet, Take 1 tablet (10 mg total) by mouth daily., Disp: 30 tablet, Rfl: 0 .  atorvastatin (LIPITOR) 10 MG tablet, Take 10 mg by mouth daily at 6 PM. , Disp: , Rfl:  .  calcitRIOL (ROCALTROL) 0.25 MCG capsule, Take 0.75 mcg by mouth daily., Disp: , Rfl:  .  carvedilol (COREG) 12.5 MG tablet, Take 12.5 mg by mouth 2 (two) times daily with a meal., Disp: , Rfl:  .   Continuous Blood Gluc Sensor (FREESTYLE LIBRE 14 DAY SENSOR) MISC, USE AS DIRECTED TO CHECK BLOOD SUGARS 4 TIMES PER DAY DX: E11.65, Disp: 4 each, Rfl: 2 .  furosemide (LASIX) 20 MG tablet, Take 20 mg by mouth daily., Disp: , Rfl: 3 .  hydrALAZINE (APRESOLINE) 50 MG tablet, Take by mouth., Disp: , Rfl:  .  iron dextran complex in sodium chloride 0.9 % 500 mL, Inject into the vein once., Disp: , Rfl:  .  IRON PO, Take 1 tablet by mouth daily. , Disp: , Rfl:  .  omeprazole (PRILOSEC) 40 MG capsule, TAKE 1 CAPSULE BY MOUTH 20 MINUTES BEFORE BREAKFAST, Disp: , Rfl:  .  OZEMPIC, 0.25 OR 0.5 MG/DOSE, 2 MG/1.5ML SOPN, INJECT 0.5MG  INTO SKIN ONCE WEEKLY ON SAME DAY EACH WK. ROTATE INJECTION SITE ABDOMEN/THIGH/UPPERARM, Disp: , Rfl:  .  BYSTOLIC 10 MG tablet, Take 10 mg by mouth daily., Disp: , Rfl:  .  fexofenadine (ALLEGRA) 180 MG tablet, Take 180 mg by mouth daily as needed for allergies or rhinitis., Disp: , Rfl:  .  ibuprofen (ADVIL,MOTRIN) 600 MG tablet, Take 1 tablet (600 mg total) by mouth every 6 (six) hours as needed. (Patient not taking: Reported on 03/17/2016), Disp: 60 tablet, Rfl: 3 .  TOUJEO SOLOSTAR 300 UNIT/ML Solostar Pen, INJECT 24 UNITS INTO THE SKIN AT BEDTIME. (Patient not taking: Reported on 10/15/2019), Disp: 9 pen, Rfl: 1   No Known Allergies    Review of Systems  Constitutional: Negative.   HENT: Negative.   Eyes: Negative.  Negative for blurred vision.  Cardiovascular: Negative for chest pain.       Heaviness and discomfort  Endocrine: Negative.   Genitourinary: Negative.   Musculoskeletal: Negative.   Skin: Negative.  Negative for pallor.       Cold Sweat  Allergic/Immunologic: Negative.   Neurological: Negative.   Hematological: Negative.   Psychiatric/Behavioral: Negative.      Today's Vitals   10/15/19 1111  BP: (!) 142/78  Pulse: 64  Temp: 97.7 F (36.5 C)  TempSrc: Oral  Weight: 198 lb 6.4 oz (90 kg)  Height: 5' 8.2" (1.732 m)  PainSc: 0-No pain   Body  mass index is 29.99 kg/m.   Objective:  Physical Exam Vitals and nursing note reviewed.  Constitutional:      Appearance: Normal appearance.  Eyes:     Extraocular Movements: Extraocular movements intact.     Conjunctiva/sclera: Conjunctivae normal.     Pupils: Pupils are equal, round, and reactive to light.  Cardiovascular:     Rate and Rhythm: Normal rate and regular rhythm.     Heart sounds: Normal heart sounds.  Pulmonary:     Effort: Pulmonary effort is normal.     Breath sounds: Normal breath sounds.  Abdominal:     General: Abdomen is flat. Bowel sounds are normal.     Palpations: Abdomen is soft.  Musculoskeletal:        General: Normal range of motion.  Skin:    General:  Skin is warm and dry.  Neurological:     General: No focal deficit present.     Mental Status: She is alert and oriented to person, place, and time.  Psychiatric:        Mood and Affect: Mood normal.        Behavior: Behavior normal.         Assessment And Plan:   1. Chest heaviness  EKG done, normal  She has been seen by Cardiology for her hypertension and is advised to contact them  Occurred during the elevated blood pressure - EKG 12-Lead  2. Type 2 diabetes mellitus with stage 4 chronic kidney disease, with long-term current use of insulin ([place])  She had stopped taking her medications due to having an improved A1c which she had done on her own. Will check her levels and recommend she restarts her medication to keep her levels under control.   She is to titrate the ozempic up as directed and will restart her Toujeo  This will help protect her kidneys as well.  3. Hypertensive nephropathy  Elevated today, she also has a refill of amlodipine  Advised to avoid high salt foods as well  Marylu Lund, RN    Minette Brine, DNP, FNP-BC   THE PATIENT IS ENCOURAGED TO PRACTICE SOCIAL DISTANCING DUE TO THE COVID-19 PANDEMIC.

## 2019-10-16 ENCOUNTER — Inpatient Hospital Stay (HOSPITAL_COMMUNITY): Admission: RE | Admit: 2019-10-16 | Payer: Medicaid Other | Source: Ambulatory Visit

## 2019-10-16 LAB — HEMOGLOBIN A1C
Est. average glucose Bld gHb Est-mCnc: 126 mg/dL
Hgb A1c MFr Bld: 6 % — ABNORMAL HIGH (ref 4.8–5.6)

## 2019-10-16 MED ORDER — OZEMPIC (0.25 OR 0.5 MG/DOSE) 2 MG/1.5ML ~~LOC~~ SOPN: 0.5000 mg | PEN_INJECTOR | SUBCUTANEOUS | 1 refills | Status: DC

## 2019-10-16 NOTE — Progress Notes (Signed)
She is to call on Mon and Thu with her blood sugar readings so we can see if she will be able to decrease her toujeo.

## 2019-10-18 ENCOUNTER — Telehealth: Payer: Self-pay

## 2019-10-18 DIAGNOSIS — Z20822 Contact with and (suspected) exposure to covid-19: Secondary | ICD-10-CM | POA: Diagnosis not present

## 2019-10-18 NOTE — Telephone Encounter (Signed)
PA SENT TO PLAN FOR OZEMPIC.

## 2019-10-19 DIAGNOSIS — D631 Anemia in chronic kidney disease: Secondary | ICD-10-CM | POA: Diagnosis not present

## 2019-10-19 DIAGNOSIS — I120 Hypertensive chronic kidney disease with stage 5 chronic kidney disease or end stage renal disease: Secondary | ICD-10-CM | POA: Diagnosis not present

## 2019-10-19 DIAGNOSIS — R809 Proteinuria, unspecified: Secondary | ICD-10-CM | POA: Diagnosis not present

## 2019-10-19 DIAGNOSIS — N185 Chronic kidney disease, stage 5: Secondary | ICD-10-CM | POA: Diagnosis not present

## 2019-10-23 ENCOUNTER — Other Ambulatory Visit: Payer: Self-pay

## 2019-10-23 ENCOUNTER — Encounter (HOSPITAL_COMMUNITY)
Admission: RE | Admit: 2019-10-23 | Discharge: 2019-10-23 | Disposition: A | Discharge status: Home / Self Care | Payer: Medicaid Other | Source: Ambulatory Visit | Attending: Nephrology | Admitting: Nephrology

## 2019-10-23 DIAGNOSIS — N185 Chronic kidney disease, stage 5: Secondary | ICD-10-CM | POA: Diagnosis not present

## 2019-10-23 DIAGNOSIS — D631 Anemia in chronic kidney disease: Secondary | ICD-10-CM | POA: Insufficient documentation

## 2019-10-23 LAB — POCT HEMOGLOBIN-HEMACUE: Hemoglobin: 9.5 g/dL — ABNORMAL LOW (ref 12.0–15.0)

## 2019-10-23 MED ORDER — EPOETIN ALFA-EPBX 10000 UNIT/ML IJ SOLN
INTRAMUSCULAR | Status: AC
  Filled 2019-10-23: qty 2

## 2019-10-23 MED ORDER — EPOETIN ALFA-EPBX 10000 UNIT/ML IJ SOLN
20000.0000 [IU] | Freq: Once | INTRAMUSCULAR | Status: DC
  Administered 2019-10-23: 20000 [IU] via SUBCUTANEOUS

## 2019-10-25 DIAGNOSIS — N186 End stage renal disease: Secondary | ICD-10-CM | POA: Diagnosis not present

## 2019-10-25 DIAGNOSIS — D638 Anemia in other chronic diseases classified elsewhere: Secondary | ICD-10-CM | POA: Diagnosis not present

## 2019-10-25 DIAGNOSIS — K660 Peritoneal adhesions (postprocedural) (postinfection): Secondary | ICD-10-CM | POA: Diagnosis not present

## 2019-10-25 DIAGNOSIS — Z7984 Long term (current) use of oral hypoglycemic drugs: Secondary | ICD-10-CM | POA: Diagnosis not present

## 2019-10-25 DIAGNOSIS — I120 Hypertensive chronic kidney disease with stage 5 chronic kidney disease or end stage renal disease: Secondary | ICD-10-CM | POA: Diagnosis not present

## 2019-10-25 DIAGNOSIS — E1122 Type 2 diabetes mellitus with diabetic chronic kidney disease: Secondary | ICD-10-CM | POA: Diagnosis not present

## 2019-11-06 ENCOUNTER — Encounter (HOSPITAL_COMMUNITY)
Admission: RE | Admit: 2019-11-06 | Discharge: 2019-11-06 | Disposition: A | Discharge status: Home / Self Care | Payer: Medicaid Other | Source: Ambulatory Visit | Attending: Nephrology | Admitting: Nephrology

## 2019-11-06 ENCOUNTER — Other Ambulatory Visit: Payer: Self-pay

## 2019-11-06 ENCOUNTER — Encounter (HOSPITAL_COMMUNITY): Payer: Medicaid Other

## 2019-11-06 DIAGNOSIS — N185 Chronic kidney disease, stage 5: Secondary | ICD-10-CM | POA: Diagnosis not present

## 2019-11-06 LAB — IRON AND TIBC
Iron: 52 ug/dL (ref 28–170)
Saturation Ratios: 25 % (ref 10.4–31.8)
TIBC: 206 ug/dL — ABNORMAL LOW (ref 250–450)
UIBC: 154 ug/dL

## 2019-11-06 LAB — RENAL FUNCTION PANEL
Albumin: 2.6 g/dL — ABNORMAL LOW (ref 3.5–5.0)
Anion gap: 10 (ref 5–15)
BUN: 94 mg/dL — ABNORMAL HIGH (ref 6–20)
CO2: 22 mmol/L (ref 22–32)
Calcium: 8.7 mg/dL — ABNORMAL LOW (ref 8.9–10.3)
Chloride: 112 mmol/L — ABNORMAL HIGH (ref 98–111)
Creatinine, Ser: 8.1 mg/dL — ABNORMAL HIGH (ref 0.44–1.00)
GFR calc Af Amer: 6 mL/min — ABNORMAL LOW (ref >60)
GFR calc non Af Amer: 5 mL/min — ABNORMAL LOW (ref >60)
Glucose, Bld: 160 mg/dL — ABNORMAL HIGH (ref 70–99)
Phosphorus: 9.7 mg/dL — ABNORMAL HIGH (ref 2.5–4.6)
Potassium: 4.9 mmol/L (ref 3.5–5.1)
Sodium: 144 mmol/L (ref 135–145)

## 2019-11-06 LAB — POCT HEMOGLOBIN-HEMACUE: Hemoglobin: 11.1 g/dL — ABNORMAL LOW (ref 12.0–15.0)

## 2019-11-06 MED ORDER — EPOETIN ALFA-EPBX 10000 UNIT/ML IJ SOLN
20000.0000 [IU] | Freq: Once | INTRAMUSCULAR | Status: AC
  Administered 2019-11-06: 20000 [IU] via SUBCUTANEOUS

## 2019-11-07 LAB — PTH, INTACT AND CALCIUM
Calcium, Total (PTH): 8.6 mg/dL — ABNORMAL LOW (ref 8.7–10.2)
PTH: 154 pg/mL — ABNORMAL HIGH (ref 15–65)

## 2019-11-09 DIAGNOSIS — I120 Hypertensive chronic kidney disease with stage 5 chronic kidney disease or end stage renal disease: Secondary | ICD-10-CM | POA: Diagnosis not present

## 2019-11-09 DIAGNOSIS — E1122 Type 2 diabetes mellitus with diabetic chronic kidney disease: Secondary | ICD-10-CM | POA: Diagnosis not present

## 2019-11-09 DIAGNOSIS — N186 End stage renal disease: Secondary | ICD-10-CM | POA: Diagnosis not present

## 2019-11-12 DIAGNOSIS — Z992 Dependence on renal dialysis: Secondary | ICD-10-CM | POA: Diagnosis not present

## 2019-11-12 DIAGNOSIS — N186 End stage renal disease: Secondary | ICD-10-CM | POA: Diagnosis not present

## 2019-11-13 DIAGNOSIS — N186 End stage renal disease: Secondary | ICD-10-CM | POA: Diagnosis not present

## 2019-11-13 DIAGNOSIS — Z992 Dependence on renal dialysis: Secondary | ICD-10-CM | POA: Diagnosis not present

## 2019-11-14 DIAGNOSIS — Z992 Dependence on renal dialysis: Secondary | ICD-10-CM | POA: Diagnosis not present

## 2019-11-14 DIAGNOSIS — N186 End stage renal disease: Secondary | ICD-10-CM | POA: Diagnosis not present

## 2019-11-15 ENCOUNTER — Ambulatory Visit: Payer: BC Managed Care – PPO | Admitting: Internal Medicine

## 2019-11-15 DIAGNOSIS — N186 End stage renal disease: Secondary | ICD-10-CM | POA: Diagnosis not present

## 2019-11-15 DIAGNOSIS — Z992 Dependence on renal dialysis: Secondary | ICD-10-CM | POA: Diagnosis not present

## 2019-11-19 DIAGNOSIS — Z992 Dependence on renal dialysis: Secondary | ICD-10-CM | POA: Diagnosis not present

## 2019-11-19 DIAGNOSIS — N186 End stage renal disease: Secondary | ICD-10-CM | POA: Diagnosis not present

## 2019-11-20 ENCOUNTER — Encounter (HOSPITAL_COMMUNITY): Payer: Medicaid Other

## 2019-11-20 DIAGNOSIS — Z992 Dependence on renal dialysis: Secondary | ICD-10-CM | POA: Diagnosis not present

## 2019-11-20 DIAGNOSIS — N186 End stage renal disease: Secondary | ICD-10-CM | POA: Diagnosis not present

## 2019-11-21 DIAGNOSIS — Q211 Atrial septal defect: Secondary | ICD-10-CM | POA: Diagnosis not present

## 2019-11-21 DIAGNOSIS — Z524 Kidney donor: Secondary | ICD-10-CM | POA: Diagnosis not present

## 2019-11-21 DIAGNOSIS — Z005 Encounter for examination of potential donor of organ and tissue: Secondary | ICD-10-CM | POA: Diagnosis not present

## 2019-11-21 DIAGNOSIS — N186 End stage renal disease: Secondary | ICD-10-CM | POA: Diagnosis not present

## 2019-11-21 DIAGNOSIS — Z992 Dependence on renal dialysis: Secondary | ICD-10-CM | POA: Diagnosis not present

## 2019-11-22 DIAGNOSIS — Z992 Dependence on renal dialysis: Secondary | ICD-10-CM | POA: Diagnosis not present

## 2019-11-22 DIAGNOSIS — N186 End stage renal disease: Secondary | ICD-10-CM | POA: Diagnosis not present

## 2019-11-23 DIAGNOSIS — Z992 Dependence on renal dialysis: Secondary | ICD-10-CM | POA: Diagnosis not present

## 2019-11-23 DIAGNOSIS — N186 End stage renal disease: Secondary | ICD-10-CM | POA: Diagnosis not present

## 2019-11-24 DIAGNOSIS — N186 End stage renal disease: Secondary | ICD-10-CM | POA: Diagnosis not present

## 2019-11-24 DIAGNOSIS — Z992 Dependence on renal dialysis: Secondary | ICD-10-CM | POA: Diagnosis not present

## 2019-11-25 DIAGNOSIS — Z992 Dependence on renal dialysis: Secondary | ICD-10-CM | POA: Diagnosis not present

## 2019-11-25 DIAGNOSIS — N186 End stage renal disease: Secondary | ICD-10-CM | POA: Diagnosis not present

## 2019-11-26 DIAGNOSIS — N186 End stage renal disease: Secondary | ICD-10-CM | POA: Diagnosis not present

## 2019-11-26 DIAGNOSIS — Z992 Dependence on renal dialysis: Secondary | ICD-10-CM | POA: Diagnosis not present

## 2019-11-27 DIAGNOSIS — N186 End stage renal disease: Secondary | ICD-10-CM | POA: Diagnosis not present

## 2019-11-27 DIAGNOSIS — Z992 Dependence on renal dialysis: Secondary | ICD-10-CM | POA: Diagnosis not present

## 2019-11-27 DIAGNOSIS — H35372 Puckering of macula, left eye: Secondary | ICD-10-CM | POA: Diagnosis not present

## 2019-11-27 DIAGNOSIS — H34231 Retinal artery branch occlusion, right eye: Secondary | ICD-10-CM | POA: Diagnosis not present

## 2019-11-27 DIAGNOSIS — H4321 Crystalline deposits in vitreous body, right eye: Secondary | ICD-10-CM | POA: Diagnosis not present

## 2019-11-27 DIAGNOSIS — E113513 Type 2 diabetes mellitus with proliferative diabetic retinopathy with macular edema, bilateral: Secondary | ICD-10-CM | POA: Diagnosis not present

## 2019-11-27 LAB — HM DIABETES EYE EXAM

## 2019-11-28 DIAGNOSIS — N186 End stage renal disease: Secondary | ICD-10-CM | POA: Diagnosis not present

## 2019-11-28 DIAGNOSIS — Z992 Dependence on renal dialysis: Secondary | ICD-10-CM | POA: Diagnosis not present

## 2019-11-29 DIAGNOSIS — Z992 Dependence on renal dialysis: Secondary | ICD-10-CM | POA: Diagnosis not present

## 2019-11-29 DIAGNOSIS — N186 End stage renal disease: Secondary | ICD-10-CM | POA: Diagnosis not present

## 2019-11-30 DIAGNOSIS — Z992 Dependence on renal dialysis: Secondary | ICD-10-CM | POA: Diagnosis not present

## 2019-11-30 DIAGNOSIS — N186 End stage renal disease: Secondary | ICD-10-CM | POA: Diagnosis not present

## 2019-12-01 DIAGNOSIS — N186 End stage renal disease: Secondary | ICD-10-CM | POA: Diagnosis not present

## 2019-12-01 DIAGNOSIS — Z992 Dependence on renal dialysis: Secondary | ICD-10-CM | POA: Diagnosis not present

## 2019-12-02 DIAGNOSIS — N186 End stage renal disease: Secondary | ICD-10-CM | POA: Diagnosis not present

## 2019-12-02 DIAGNOSIS — Z992 Dependence on renal dialysis: Secondary | ICD-10-CM | POA: Diagnosis not present

## 2019-12-03 DIAGNOSIS — Z992 Dependence on renal dialysis: Secondary | ICD-10-CM | POA: Diagnosis not present

## 2019-12-03 DIAGNOSIS — N186 End stage renal disease: Secondary | ICD-10-CM | POA: Diagnosis not present

## 2019-12-04 DIAGNOSIS — Z992 Dependence on renal dialysis: Secondary | ICD-10-CM | POA: Diagnosis not present

## 2019-12-04 DIAGNOSIS — N186 End stage renal disease: Secondary | ICD-10-CM | POA: Diagnosis not present

## 2019-12-05 DIAGNOSIS — Z992 Dependence on renal dialysis: Secondary | ICD-10-CM | POA: Diagnosis not present

## 2019-12-05 DIAGNOSIS — N186 End stage renal disease: Secondary | ICD-10-CM | POA: Diagnosis not present

## 2019-12-06 DIAGNOSIS — N186 End stage renal disease: Secondary | ICD-10-CM | POA: Diagnosis not present

## 2019-12-06 DIAGNOSIS — Z992 Dependence on renal dialysis: Secondary | ICD-10-CM | POA: Diagnosis not present

## 2019-12-07 DIAGNOSIS — Z992 Dependence on renal dialysis: Secondary | ICD-10-CM | POA: Diagnosis not present

## 2019-12-07 DIAGNOSIS — N186 End stage renal disease: Secondary | ICD-10-CM | POA: Diagnosis not present

## 2019-12-08 DIAGNOSIS — N186 End stage renal disease: Secondary | ICD-10-CM | POA: Diagnosis not present

## 2019-12-08 DIAGNOSIS — E1122 Type 2 diabetes mellitus with diabetic chronic kidney disease: Secondary | ICD-10-CM | POA: Diagnosis not present

## 2019-12-08 DIAGNOSIS — Z992 Dependence on renal dialysis: Secondary | ICD-10-CM | POA: Diagnosis not present

## 2019-12-09 DIAGNOSIS — Z992 Dependence on renal dialysis: Secondary | ICD-10-CM | POA: Diagnosis not present

## 2019-12-09 DIAGNOSIS — N186 End stage renal disease: Secondary | ICD-10-CM | POA: Diagnosis not present

## 2019-12-09 DIAGNOSIS — Z23 Encounter for immunization: Secondary | ICD-10-CM | POA: Diagnosis not present

## 2019-12-10 DIAGNOSIS — N186 End stage renal disease: Secondary | ICD-10-CM | POA: Diagnosis not present

## 2019-12-10 DIAGNOSIS — Z23 Encounter for immunization: Secondary | ICD-10-CM | POA: Diagnosis not present

## 2019-12-10 DIAGNOSIS — Z992 Dependence on renal dialysis: Secondary | ICD-10-CM | POA: Diagnosis not present

## 2019-12-11 DIAGNOSIS — N186 End stage renal disease: Secondary | ICD-10-CM | POA: Diagnosis not present

## 2019-12-11 DIAGNOSIS — Z992 Dependence on renal dialysis: Secondary | ICD-10-CM | POA: Diagnosis not present

## 2019-12-11 DIAGNOSIS — Z23 Encounter for immunization: Secondary | ICD-10-CM | POA: Diagnosis not present

## 2019-12-12 DIAGNOSIS — Z992 Dependence on renal dialysis: Secondary | ICD-10-CM | POA: Diagnosis not present

## 2019-12-12 DIAGNOSIS — N186 End stage renal disease: Secondary | ICD-10-CM | POA: Diagnosis not present

## 2019-12-12 DIAGNOSIS — Z23 Encounter for immunization: Secondary | ICD-10-CM | POA: Diagnosis not present

## 2019-12-13 DIAGNOSIS — Z23 Encounter for immunization: Secondary | ICD-10-CM | POA: Diagnosis not present

## 2019-12-13 DIAGNOSIS — N186 End stage renal disease: Secondary | ICD-10-CM | POA: Diagnosis not present

## 2019-12-13 DIAGNOSIS — Z992 Dependence on renal dialysis: Secondary | ICD-10-CM | POA: Diagnosis not present

## 2019-12-14 DIAGNOSIS — Z23 Encounter for immunization: Secondary | ICD-10-CM | POA: Diagnosis not present

## 2019-12-14 DIAGNOSIS — N186 End stage renal disease: Secondary | ICD-10-CM | POA: Diagnosis not present

## 2019-12-14 DIAGNOSIS — Z992 Dependence on renal dialysis: Secondary | ICD-10-CM | POA: Diagnosis not present

## 2019-12-15 DIAGNOSIS — N186 End stage renal disease: Secondary | ICD-10-CM | POA: Diagnosis not present

## 2019-12-15 DIAGNOSIS — Z992 Dependence on renal dialysis: Secondary | ICD-10-CM | POA: Diagnosis not present

## 2019-12-15 DIAGNOSIS — Z23 Encounter for immunization: Secondary | ICD-10-CM | POA: Diagnosis not present

## 2019-12-16 DIAGNOSIS — Z992 Dependence on renal dialysis: Secondary | ICD-10-CM | POA: Diagnosis not present

## 2019-12-16 DIAGNOSIS — N186 End stage renal disease: Secondary | ICD-10-CM | POA: Diagnosis not present

## 2019-12-17 DIAGNOSIS — Z992 Dependence on renal dialysis: Secondary | ICD-10-CM | POA: Diagnosis not present

## 2019-12-17 DIAGNOSIS — N186 End stage renal disease: Secondary | ICD-10-CM | POA: Diagnosis not present

## 2019-12-18 DIAGNOSIS — Z992 Dependence on renal dialysis: Secondary | ICD-10-CM | POA: Diagnosis not present

## 2019-12-18 DIAGNOSIS — N186 End stage renal disease: Secondary | ICD-10-CM | POA: Diagnosis not present

## 2019-12-19 DIAGNOSIS — N186 End stage renal disease: Secondary | ICD-10-CM | POA: Diagnosis not present

## 2019-12-19 DIAGNOSIS — Z992 Dependence on renal dialysis: Secondary | ICD-10-CM | POA: Diagnosis not present

## 2019-12-20 DIAGNOSIS — Z992 Dependence on renal dialysis: Secondary | ICD-10-CM | POA: Diagnosis not present

## 2019-12-20 DIAGNOSIS — N186 End stage renal disease: Secondary | ICD-10-CM | POA: Diagnosis not present

## 2019-12-21 DIAGNOSIS — N186 End stage renal disease: Secondary | ICD-10-CM | POA: Diagnosis not present

## 2019-12-21 DIAGNOSIS — Z992 Dependence on renal dialysis: Secondary | ICD-10-CM | POA: Diagnosis not present

## 2019-12-22 DIAGNOSIS — N186 End stage renal disease: Secondary | ICD-10-CM | POA: Diagnosis not present

## 2019-12-22 DIAGNOSIS — Z992 Dependence on renal dialysis: Secondary | ICD-10-CM | POA: Diagnosis not present

## 2019-12-23 DIAGNOSIS — Z992 Dependence on renal dialysis: Secondary | ICD-10-CM | POA: Diagnosis not present

## 2019-12-23 DIAGNOSIS — N186 End stage renal disease: Secondary | ICD-10-CM | POA: Diagnosis not present

## 2019-12-24 DIAGNOSIS — N186 End stage renal disease: Secondary | ICD-10-CM | POA: Diagnosis not present

## 2019-12-24 DIAGNOSIS — Z992 Dependence on renal dialysis: Secondary | ICD-10-CM | POA: Diagnosis not present

## 2019-12-25 DIAGNOSIS — Z992 Dependence on renal dialysis: Secondary | ICD-10-CM | POA: Diagnosis not present

## 2019-12-25 DIAGNOSIS — N186 End stage renal disease: Secondary | ICD-10-CM | POA: Diagnosis not present

## 2019-12-27 DIAGNOSIS — N186 End stage renal disease: Secondary | ICD-10-CM | POA: Diagnosis not present

## 2019-12-27 DIAGNOSIS — Z992 Dependence on renal dialysis: Secondary | ICD-10-CM | POA: Diagnosis not present

## 2019-12-28 DIAGNOSIS — N186 End stage renal disease: Secondary | ICD-10-CM | POA: Diagnosis not present

## 2019-12-28 DIAGNOSIS — Z992 Dependence on renal dialysis: Secondary | ICD-10-CM | POA: Diagnosis not present

## 2019-12-29 DIAGNOSIS — N186 End stage renal disease: Secondary | ICD-10-CM | POA: Diagnosis not present

## 2019-12-29 DIAGNOSIS — Z992 Dependence on renal dialysis: Secondary | ICD-10-CM | POA: Diagnosis not present

## 2019-12-30 DIAGNOSIS — N186 End stage renal disease: Secondary | ICD-10-CM | POA: Diagnosis not present

## 2019-12-30 DIAGNOSIS — Z992 Dependence on renal dialysis: Secondary | ICD-10-CM | POA: Diagnosis not present

## 2019-12-31 DIAGNOSIS — N186 End stage renal disease: Secondary | ICD-10-CM | POA: Diagnosis not present

## 2019-12-31 DIAGNOSIS — Z992 Dependence on renal dialysis: Secondary | ICD-10-CM | POA: Diagnosis not present

## 2020-01-01 DIAGNOSIS — N186 End stage renal disease: Secondary | ICD-10-CM | POA: Diagnosis not present

## 2020-01-01 DIAGNOSIS — Z992 Dependence on renal dialysis: Secondary | ICD-10-CM | POA: Diagnosis not present

## 2020-01-02 DIAGNOSIS — N186 End stage renal disease: Secondary | ICD-10-CM | POA: Diagnosis not present

## 2020-01-02 DIAGNOSIS — Z992 Dependence on renal dialysis: Secondary | ICD-10-CM | POA: Diagnosis not present

## 2020-01-03 DIAGNOSIS — Z992 Dependence on renal dialysis: Secondary | ICD-10-CM | POA: Diagnosis not present

## 2020-01-03 DIAGNOSIS — N186 End stage renal disease: Secondary | ICD-10-CM | POA: Diagnosis not present

## 2020-01-04 DIAGNOSIS — N186 End stage renal disease: Secondary | ICD-10-CM | POA: Diagnosis not present

## 2020-01-04 DIAGNOSIS — Z992 Dependence on renal dialysis: Secondary | ICD-10-CM | POA: Diagnosis not present

## 2020-01-05 DIAGNOSIS — N186 End stage renal disease: Secondary | ICD-10-CM | POA: Diagnosis not present

## 2020-01-05 DIAGNOSIS — Z992 Dependence on renal dialysis: Secondary | ICD-10-CM | POA: Diagnosis not present

## 2020-01-06 DIAGNOSIS — N186 End stage renal disease: Secondary | ICD-10-CM | POA: Diagnosis not present

## 2020-01-06 DIAGNOSIS — Z992 Dependence on renal dialysis: Secondary | ICD-10-CM | POA: Diagnosis not present

## 2020-01-07 DIAGNOSIS — N186 End stage renal disease: Secondary | ICD-10-CM | POA: Diagnosis not present

## 2020-01-07 DIAGNOSIS — Z992 Dependence on renal dialysis: Secondary | ICD-10-CM | POA: Diagnosis not present

## 2020-01-08 DIAGNOSIS — Z992 Dependence on renal dialysis: Secondary | ICD-10-CM | POA: Diagnosis not present

## 2020-01-08 DIAGNOSIS — E1122 Type 2 diabetes mellitus with diabetic chronic kidney disease: Secondary | ICD-10-CM | POA: Diagnosis not present

## 2020-01-08 DIAGNOSIS — N186 End stage renal disease: Secondary | ICD-10-CM | POA: Diagnosis not present

## 2020-01-09 DIAGNOSIS — N186 End stage renal disease: Secondary | ICD-10-CM | POA: Diagnosis not present

## 2020-01-09 DIAGNOSIS — Z992 Dependence on renal dialysis: Secondary | ICD-10-CM | POA: Diagnosis not present

## 2020-01-10 DIAGNOSIS — N186 End stage renal disease: Secondary | ICD-10-CM | POA: Diagnosis not present

## 2020-01-10 DIAGNOSIS — Z992 Dependence on renal dialysis: Secondary | ICD-10-CM | POA: Diagnosis not present

## 2020-01-11 DIAGNOSIS — N186 End stage renal disease: Secondary | ICD-10-CM | POA: Diagnosis not present

## 2020-01-11 DIAGNOSIS — Z992 Dependence on renal dialysis: Secondary | ICD-10-CM | POA: Diagnosis not present

## 2020-01-12 DIAGNOSIS — Z992 Dependence on renal dialysis: Secondary | ICD-10-CM | POA: Diagnosis not present

## 2020-01-12 DIAGNOSIS — N186 End stage renal disease: Secondary | ICD-10-CM | POA: Diagnosis not present

## 2020-01-13 DIAGNOSIS — N186 End stage renal disease: Secondary | ICD-10-CM | POA: Diagnosis not present

## 2020-01-13 DIAGNOSIS — Z992 Dependence on renal dialysis: Secondary | ICD-10-CM | POA: Diagnosis not present

## 2020-01-14 DIAGNOSIS — N186 End stage renal disease: Secondary | ICD-10-CM | POA: Diagnosis not present

## 2020-01-14 DIAGNOSIS — Z992 Dependence on renal dialysis: Secondary | ICD-10-CM | POA: Diagnosis not present

## 2020-01-15 DIAGNOSIS — N186 End stage renal disease: Secondary | ICD-10-CM | POA: Diagnosis not present

## 2020-01-15 DIAGNOSIS — Z992 Dependence on renal dialysis: Secondary | ICD-10-CM | POA: Diagnosis not present

## 2020-01-16 DIAGNOSIS — N186 End stage renal disease: Secondary | ICD-10-CM | POA: Diagnosis not present

## 2020-01-16 DIAGNOSIS — Z992 Dependence on renal dialysis: Secondary | ICD-10-CM | POA: Diagnosis not present

## 2020-01-17 DIAGNOSIS — Z992 Dependence on renal dialysis: Secondary | ICD-10-CM | POA: Diagnosis not present

## 2020-01-17 DIAGNOSIS — N186 End stage renal disease: Secondary | ICD-10-CM | POA: Diagnosis not present

## 2020-01-18 DIAGNOSIS — N186 End stage renal disease: Secondary | ICD-10-CM | POA: Diagnosis not present

## 2020-01-18 DIAGNOSIS — Z992 Dependence on renal dialysis: Secondary | ICD-10-CM | POA: Diagnosis not present

## 2020-01-19 DIAGNOSIS — Z992 Dependence on renal dialysis: Secondary | ICD-10-CM | POA: Diagnosis not present

## 2020-01-19 DIAGNOSIS — N186 End stage renal disease: Secondary | ICD-10-CM | POA: Diagnosis not present

## 2020-01-20 DIAGNOSIS — Z992 Dependence on renal dialysis: Secondary | ICD-10-CM | POA: Diagnosis not present

## 2020-01-20 DIAGNOSIS — N186 End stage renal disease: Secondary | ICD-10-CM | POA: Diagnosis not present

## 2020-01-21 DIAGNOSIS — N186 End stage renal disease: Secondary | ICD-10-CM | POA: Diagnosis not present

## 2020-01-21 DIAGNOSIS — Z992 Dependence on renal dialysis: Secondary | ICD-10-CM | POA: Diagnosis not present

## 2020-01-22 DIAGNOSIS — Z005 Encounter for examination of potential donor of organ and tissue: Secondary | ICD-10-CM | POA: Diagnosis not present

## 2020-01-22 DIAGNOSIS — N186 End stage renal disease: Secondary | ICD-10-CM | POA: Diagnosis not present

## 2020-01-22 DIAGNOSIS — Z992 Dependence on renal dialysis: Secondary | ICD-10-CM | POA: Diagnosis not present

## 2020-01-22 DIAGNOSIS — I517 Cardiomegaly: Secondary | ICD-10-CM | POA: Diagnosis not present

## 2020-01-23 DIAGNOSIS — N186 End stage renal disease: Secondary | ICD-10-CM | POA: Diagnosis not present

## 2020-01-23 DIAGNOSIS — Z992 Dependence on renal dialysis: Secondary | ICD-10-CM | POA: Diagnosis not present

## 2020-01-24 DIAGNOSIS — N186 End stage renal disease: Secondary | ICD-10-CM | POA: Diagnosis not present

## 2020-01-24 DIAGNOSIS — Z992 Dependence on renal dialysis: Secondary | ICD-10-CM | POA: Diagnosis not present

## 2020-01-25 DIAGNOSIS — Z992 Dependence on renal dialysis: Secondary | ICD-10-CM | POA: Diagnosis not present

## 2020-01-25 DIAGNOSIS — N186 End stage renal disease: Secondary | ICD-10-CM | POA: Diagnosis not present

## 2020-01-26 DIAGNOSIS — N186 End stage renal disease: Secondary | ICD-10-CM | POA: Diagnosis not present

## 2020-01-26 DIAGNOSIS — Z992 Dependence on renal dialysis: Secondary | ICD-10-CM | POA: Diagnosis not present

## 2020-01-27 DIAGNOSIS — N186 End stage renal disease: Secondary | ICD-10-CM | POA: Diagnosis not present

## 2020-01-27 DIAGNOSIS — Z992 Dependence on renal dialysis: Secondary | ICD-10-CM | POA: Diagnosis not present

## 2020-01-27 DIAGNOSIS — Z23 Encounter for immunization: Secondary | ICD-10-CM | POA: Diagnosis not present

## 2020-01-28 DIAGNOSIS — Z23 Encounter for immunization: Secondary | ICD-10-CM | POA: Diagnosis not present

## 2020-01-28 DIAGNOSIS — Z992 Dependence on renal dialysis: Secondary | ICD-10-CM | POA: Diagnosis not present

## 2020-01-28 DIAGNOSIS — N186 End stage renal disease: Secondary | ICD-10-CM | POA: Diagnosis not present

## 2020-01-29 DIAGNOSIS — Z992 Dependence on renal dialysis: Secondary | ICD-10-CM | POA: Diagnosis not present

## 2020-01-29 DIAGNOSIS — Z23 Encounter for immunization: Secondary | ICD-10-CM | POA: Diagnosis not present

## 2020-01-29 DIAGNOSIS — N186 End stage renal disease: Secondary | ICD-10-CM | POA: Diagnosis not present

## 2020-01-30 DIAGNOSIS — Z992 Dependence on renal dialysis: Secondary | ICD-10-CM | POA: Diagnosis not present

## 2020-01-30 DIAGNOSIS — Z23 Encounter for immunization: Secondary | ICD-10-CM | POA: Diagnosis not present

## 2020-01-30 DIAGNOSIS — N186 End stage renal disease: Secondary | ICD-10-CM | POA: Diagnosis not present

## 2020-01-31 DIAGNOSIS — Z992 Dependence on renal dialysis: Secondary | ICD-10-CM | POA: Diagnosis not present

## 2020-01-31 DIAGNOSIS — N186 End stage renal disease: Secondary | ICD-10-CM | POA: Diagnosis not present

## 2020-01-31 DIAGNOSIS — Z23 Encounter for immunization: Secondary | ICD-10-CM | POA: Diagnosis not present

## 2020-02-01 DIAGNOSIS — Z23 Encounter for immunization: Secondary | ICD-10-CM | POA: Diagnosis not present

## 2020-02-01 DIAGNOSIS — Z992 Dependence on renal dialysis: Secondary | ICD-10-CM | POA: Diagnosis not present

## 2020-02-01 DIAGNOSIS — N186 End stage renal disease: Secondary | ICD-10-CM | POA: Diagnosis not present

## 2020-02-02 DIAGNOSIS — Z23 Encounter for immunization: Secondary | ICD-10-CM | POA: Diagnosis not present

## 2020-02-02 DIAGNOSIS — N186 End stage renal disease: Secondary | ICD-10-CM | POA: Diagnosis not present

## 2020-02-02 DIAGNOSIS — Z992 Dependence on renal dialysis: Secondary | ICD-10-CM | POA: Diagnosis not present

## 2020-02-03 DIAGNOSIS — Z992 Dependence on renal dialysis: Secondary | ICD-10-CM | POA: Diagnosis not present

## 2020-02-03 DIAGNOSIS — N186 End stage renal disease: Secondary | ICD-10-CM | POA: Diagnosis not present

## 2020-02-04 DIAGNOSIS — N186 End stage renal disease: Secondary | ICD-10-CM | POA: Diagnosis not present

## 2020-02-04 DIAGNOSIS — Z992 Dependence on renal dialysis: Secondary | ICD-10-CM | POA: Diagnosis not present

## 2020-02-05 DIAGNOSIS — N186 End stage renal disease: Secondary | ICD-10-CM | POA: Diagnosis not present

## 2020-02-05 DIAGNOSIS — Z992 Dependence on renal dialysis: Secondary | ICD-10-CM | POA: Diagnosis not present

## 2020-02-06 DIAGNOSIS — N186 End stage renal disease: Secondary | ICD-10-CM | POA: Diagnosis not present

## 2020-02-06 DIAGNOSIS — Z992 Dependence on renal dialysis: Secondary | ICD-10-CM | POA: Diagnosis not present

## 2020-02-07 DIAGNOSIS — N186 End stage renal disease: Secondary | ICD-10-CM | POA: Diagnosis not present

## 2020-02-07 DIAGNOSIS — Z992 Dependence on renal dialysis: Secondary | ICD-10-CM | POA: Diagnosis not present

## 2020-02-07 DIAGNOSIS — E1122 Type 2 diabetes mellitus with diabetic chronic kidney disease: Secondary | ICD-10-CM | POA: Diagnosis not present

## 2020-02-08 DIAGNOSIS — N186 End stage renal disease: Secondary | ICD-10-CM | POA: Diagnosis not present

## 2020-02-08 DIAGNOSIS — Z992 Dependence on renal dialysis: Secondary | ICD-10-CM | POA: Diagnosis not present

## 2020-02-09 DIAGNOSIS — Z992 Dependence on renal dialysis: Secondary | ICD-10-CM | POA: Diagnosis not present

## 2020-02-09 DIAGNOSIS — N186 End stage renal disease: Secondary | ICD-10-CM | POA: Diagnosis not present

## 2020-02-10 DIAGNOSIS — Z992 Dependence on renal dialysis: Secondary | ICD-10-CM | POA: Diagnosis not present

## 2020-02-10 DIAGNOSIS — Z23 Encounter for immunization: Secondary | ICD-10-CM | POA: Diagnosis not present

## 2020-02-10 DIAGNOSIS — N186 End stage renal disease: Secondary | ICD-10-CM | POA: Diagnosis not present

## 2020-02-11 DIAGNOSIS — Z23 Encounter for immunization: Secondary | ICD-10-CM | POA: Diagnosis not present

## 2020-02-11 DIAGNOSIS — Z992 Dependence on renal dialysis: Secondary | ICD-10-CM | POA: Diagnosis not present

## 2020-02-11 DIAGNOSIS — N186 End stage renal disease: Secondary | ICD-10-CM | POA: Diagnosis not present

## 2020-02-12 DIAGNOSIS — Z992 Dependence on renal dialysis: Secondary | ICD-10-CM | POA: Diagnosis not present

## 2020-02-12 DIAGNOSIS — Q211 Atrial septal defect: Secondary | ICD-10-CM | POA: Diagnosis not present

## 2020-02-12 DIAGNOSIS — N186 End stage renal disease: Secondary | ICD-10-CM | POA: Diagnosis not present

## 2020-02-12 DIAGNOSIS — Z23 Encounter for immunization: Secondary | ICD-10-CM | POA: Diagnosis not present

## 2020-02-13 DIAGNOSIS — Z23 Encounter for immunization: Secondary | ICD-10-CM | POA: Diagnosis not present

## 2020-02-13 DIAGNOSIS — Z992 Dependence on renal dialysis: Secondary | ICD-10-CM | POA: Diagnosis not present

## 2020-02-13 DIAGNOSIS — N186 End stage renal disease: Secondary | ICD-10-CM | POA: Diagnosis not present

## 2020-02-14 DIAGNOSIS — N186 End stage renal disease: Secondary | ICD-10-CM | POA: Diagnosis not present

## 2020-02-14 DIAGNOSIS — Z23 Encounter for immunization: Secondary | ICD-10-CM | POA: Diagnosis not present

## 2020-02-14 DIAGNOSIS — Z992 Dependence on renal dialysis: Secondary | ICD-10-CM | POA: Diagnosis not present

## 2020-02-15 DIAGNOSIS — Z992 Dependence on renal dialysis: Secondary | ICD-10-CM | POA: Diagnosis not present

## 2020-02-15 DIAGNOSIS — N186 End stage renal disease: Secondary | ICD-10-CM | POA: Diagnosis not present

## 2020-02-15 DIAGNOSIS — Z23 Encounter for immunization: Secondary | ICD-10-CM | POA: Diagnosis not present

## 2020-02-16 DIAGNOSIS — Z992 Dependence on renal dialysis: Secondary | ICD-10-CM | POA: Diagnosis not present

## 2020-02-16 DIAGNOSIS — Z23 Encounter for immunization: Secondary | ICD-10-CM | POA: Diagnosis not present

## 2020-02-16 DIAGNOSIS — N186 End stage renal disease: Secondary | ICD-10-CM | POA: Diagnosis not present

## 2020-02-17 DIAGNOSIS — N186 End stage renal disease: Secondary | ICD-10-CM | POA: Diagnosis not present

## 2020-02-17 DIAGNOSIS — Z992 Dependence on renal dialysis: Secondary | ICD-10-CM | POA: Diagnosis not present

## 2020-02-18 DIAGNOSIS — N186 End stage renal disease: Secondary | ICD-10-CM | POA: Diagnosis not present

## 2020-02-18 DIAGNOSIS — Z992 Dependence on renal dialysis: Secondary | ICD-10-CM | POA: Diagnosis not present

## 2020-02-19 DIAGNOSIS — N186 End stage renal disease: Secondary | ICD-10-CM | POA: Diagnosis not present

## 2020-02-19 DIAGNOSIS — Z992 Dependence on renal dialysis: Secondary | ICD-10-CM | POA: Diagnosis not present

## 2020-02-20 DIAGNOSIS — Z992 Dependence on renal dialysis: Secondary | ICD-10-CM | POA: Diagnosis not present

## 2020-02-20 DIAGNOSIS — N186 End stage renal disease: Secondary | ICD-10-CM | POA: Diagnosis not present

## 2020-02-21 DIAGNOSIS — N186 End stage renal disease: Secondary | ICD-10-CM | POA: Diagnosis not present

## 2020-02-21 DIAGNOSIS — Z992 Dependence on renal dialysis: Secondary | ICD-10-CM | POA: Diagnosis not present

## 2020-02-22 DIAGNOSIS — N186 End stage renal disease: Secondary | ICD-10-CM | POA: Diagnosis not present

## 2020-02-22 DIAGNOSIS — Z992 Dependence on renal dialysis: Secondary | ICD-10-CM | POA: Diagnosis not present

## 2020-02-23 DIAGNOSIS — N186 End stage renal disease: Secondary | ICD-10-CM | POA: Diagnosis not present

## 2020-02-23 DIAGNOSIS — Z992 Dependence on renal dialysis: Secondary | ICD-10-CM | POA: Diagnosis not present

## 2020-02-24 DIAGNOSIS — N186 End stage renal disease: Secondary | ICD-10-CM | POA: Diagnosis not present

## 2020-02-24 DIAGNOSIS — Z23 Encounter for immunization: Secondary | ICD-10-CM | POA: Diagnosis not present

## 2020-02-24 DIAGNOSIS — Z992 Dependence on renal dialysis: Secondary | ICD-10-CM | POA: Diagnosis not present

## 2020-02-25 DIAGNOSIS — Z23 Encounter for immunization: Secondary | ICD-10-CM | POA: Diagnosis not present

## 2020-02-25 DIAGNOSIS — Z992 Dependence on renal dialysis: Secondary | ICD-10-CM | POA: Diagnosis not present

## 2020-02-25 DIAGNOSIS — N186 End stage renal disease: Secondary | ICD-10-CM | POA: Diagnosis not present

## 2020-02-26 DIAGNOSIS — Z992 Dependence on renal dialysis: Secondary | ICD-10-CM | POA: Diagnosis not present

## 2020-02-26 DIAGNOSIS — Q211 Atrial septal defect: Secondary | ICD-10-CM | POA: Diagnosis not present

## 2020-02-26 DIAGNOSIS — Z01810 Encounter for preprocedural cardiovascular examination: Secondary | ICD-10-CM | POA: Diagnosis not present

## 2020-02-26 DIAGNOSIS — N186 End stage renal disease: Secondary | ICD-10-CM | POA: Diagnosis not present

## 2020-02-26 DIAGNOSIS — Z23 Encounter for immunization: Secondary | ICD-10-CM | POA: Diagnosis not present

## 2020-02-27 DIAGNOSIS — Z992 Dependence on renal dialysis: Secondary | ICD-10-CM | POA: Diagnosis not present

## 2020-02-27 DIAGNOSIS — N186 End stage renal disease: Secondary | ICD-10-CM | POA: Diagnosis not present

## 2020-02-27 DIAGNOSIS — Z23 Encounter for immunization: Secondary | ICD-10-CM | POA: Diagnosis not present

## 2020-02-28 DIAGNOSIS — Z992 Dependence on renal dialysis: Secondary | ICD-10-CM | POA: Diagnosis not present

## 2020-02-28 DIAGNOSIS — N186 End stage renal disease: Secondary | ICD-10-CM | POA: Diagnosis not present

## 2020-02-28 DIAGNOSIS — Z23 Encounter for immunization: Secondary | ICD-10-CM | POA: Diagnosis not present

## 2020-02-29 DIAGNOSIS — N186 End stage renal disease: Secondary | ICD-10-CM | POA: Diagnosis not present

## 2020-02-29 DIAGNOSIS — Z23 Encounter for immunization: Secondary | ICD-10-CM | POA: Diagnosis not present

## 2020-02-29 DIAGNOSIS — Z992 Dependence on renal dialysis: Secondary | ICD-10-CM | POA: Diagnosis not present

## 2020-03-01 DIAGNOSIS — Z992 Dependence on renal dialysis: Secondary | ICD-10-CM | POA: Diagnosis not present

## 2020-03-01 DIAGNOSIS — Z23 Encounter for immunization: Secondary | ICD-10-CM | POA: Diagnosis not present

## 2020-03-01 DIAGNOSIS — N186 End stage renal disease: Secondary | ICD-10-CM | POA: Diagnosis not present

## 2020-03-02 DIAGNOSIS — N186 End stage renal disease: Secondary | ICD-10-CM | POA: Diagnosis not present

## 2020-03-02 DIAGNOSIS — Z992 Dependence on renal dialysis: Secondary | ICD-10-CM | POA: Diagnosis not present

## 2020-03-03 DIAGNOSIS — N186 End stage renal disease: Secondary | ICD-10-CM | POA: Diagnosis not present

## 2020-03-03 DIAGNOSIS — Z992 Dependence on renal dialysis: Secondary | ICD-10-CM | POA: Diagnosis not present

## 2020-03-04 DIAGNOSIS — N186 End stage renal disease: Secondary | ICD-10-CM | POA: Diagnosis not present

## 2020-03-04 DIAGNOSIS — Z992 Dependence on renal dialysis: Secondary | ICD-10-CM | POA: Diagnosis not present

## 2020-03-05 DIAGNOSIS — Z992 Dependence on renal dialysis: Secondary | ICD-10-CM | POA: Diagnosis not present

## 2020-03-05 DIAGNOSIS — N186 End stage renal disease: Secondary | ICD-10-CM | POA: Diagnosis not present

## 2020-03-06 ENCOUNTER — Other Ambulatory Visit: Payer: Self-pay | Admitting: Nephrology

## 2020-03-06 ENCOUNTER — Ambulatory Visit
Admission: RE | Admit: 2020-03-06 | Discharge: 2020-03-06 | Disposition: A | Discharge status: Home / Self Care | Payer: BLUE CROSS/BLUE SHIELD | Source: Ambulatory Visit | Attending: Nephrology | Admitting: Nephrology

## 2020-03-06 DIAGNOSIS — Z992 Dependence on renal dialysis: Secondary | ICD-10-CM | POA: Diagnosis not present

## 2020-03-06 DIAGNOSIS — T85611A Breakdown (mechanical) of intraperitoneal dialysis catheter, initial encounter: Secondary | ICD-10-CM

## 2020-03-06 DIAGNOSIS — N186 End stage renal disease: Secondary | ICD-10-CM | POA: Diagnosis not present

## 2020-03-06 DIAGNOSIS — Z4902 Encounter for fitting and adjustment of peritoneal dialysis catheter: Secondary | ICD-10-CM | POA: Diagnosis not present

## 2020-03-07 DIAGNOSIS — R809 Proteinuria, unspecified: Secondary | ICD-10-CM | POA: Diagnosis not present

## 2020-03-07 DIAGNOSIS — E1122 Type 2 diabetes mellitus with diabetic chronic kidney disease: Secondary | ICD-10-CM | POA: Diagnosis not present

## 2020-03-07 DIAGNOSIS — Z992 Dependence on renal dialysis: Secondary | ICD-10-CM | POA: Diagnosis not present

## 2020-03-07 DIAGNOSIS — N186 End stage renal disease: Secondary | ICD-10-CM | POA: Diagnosis not present

## 2020-03-07 DIAGNOSIS — T85611A Breakdown (mechanical) of intraperitoneal dialysis catheter, initial encounter: Secondary | ICD-10-CM | POA: Diagnosis not present

## 2020-03-08 DIAGNOSIS — Z992 Dependence on renal dialysis: Secondary | ICD-10-CM | POA: Diagnosis not present

## 2020-03-08 DIAGNOSIS — N186 End stage renal disease: Secondary | ICD-10-CM | POA: Diagnosis not present

## 2020-03-09 DIAGNOSIS — E1122 Type 2 diabetes mellitus with diabetic chronic kidney disease: Secondary | ICD-10-CM | POA: Diagnosis not present

## 2020-03-09 DIAGNOSIS — Z992 Dependence on renal dialysis: Secondary | ICD-10-CM | POA: Diagnosis not present

## 2020-03-09 DIAGNOSIS — N186 End stage renal disease: Secondary | ICD-10-CM | POA: Diagnosis not present

## 2020-03-10 DIAGNOSIS — Z992 Dependence on renal dialysis: Secondary | ICD-10-CM | POA: Diagnosis not present

## 2020-03-10 DIAGNOSIS — N186 End stage renal disease: Secondary | ICD-10-CM | POA: Diagnosis not present

## 2020-03-11 DIAGNOSIS — Z992 Dependence on renal dialysis: Secondary | ICD-10-CM | POA: Diagnosis not present

## 2020-03-11 DIAGNOSIS — N186 End stage renal disease: Secondary | ICD-10-CM | POA: Diagnosis not present

## 2020-03-12 DIAGNOSIS — Z992 Dependence on renal dialysis: Secondary | ICD-10-CM | POA: Diagnosis not present

## 2020-03-12 DIAGNOSIS — N186 End stage renal disease: Secondary | ICD-10-CM | POA: Diagnosis not present

## 2020-03-13 DIAGNOSIS — N186 End stage renal disease: Secondary | ICD-10-CM | POA: Diagnosis not present

## 2020-03-13 DIAGNOSIS — T85898A Other specified complication of other internal prosthetic devices, implants and grafts, initial encounter: Secondary | ICD-10-CM | POA: Diagnosis not present

## 2020-03-13 DIAGNOSIS — N185 Chronic kidney disease, stage 5: Secondary | ICD-10-CM | POA: Diagnosis not present

## 2020-03-13 DIAGNOSIS — E1122 Type 2 diabetes mellitus with diabetic chronic kidney disease: Secondary | ICD-10-CM | POA: Diagnosis not present

## 2020-03-13 DIAGNOSIS — T85691A Other mechanical complication of intraperitoneal dialysis catheter, initial encounter: Secondary | ICD-10-CM | POA: Diagnosis not present

## 2020-03-13 DIAGNOSIS — Z8616 Personal history of COVID-19: Secondary | ICD-10-CM | POA: Diagnosis not present

## 2020-03-13 DIAGNOSIS — T85848A Pain due to other internal prosthetic devices, implants and grafts, initial encounter: Secondary | ICD-10-CM | POA: Diagnosis not present

## 2020-03-13 DIAGNOSIS — I120 Hypertensive chronic kidney disease with stage 5 chronic kidney disease or end stage renal disease: Secondary | ICD-10-CM | POA: Diagnosis not present

## 2020-03-13 DIAGNOSIS — Y838 Other surgical procedures as the cause of abnormal reaction of the patient, or of later complication, without mention of misadventure at the time of the procedure: Secondary | ICD-10-CM | POA: Diagnosis not present

## 2020-03-13 DIAGNOSIS — K660 Peritoneal adhesions (postprocedural) (postinfection): Secondary | ICD-10-CM | POA: Diagnosis not present

## 2020-03-13 DIAGNOSIS — Z992 Dependence on renal dialysis: Secondary | ICD-10-CM | POA: Diagnosis not present

## 2020-03-13 DIAGNOSIS — Z4902 Encounter for fitting and adjustment of peritoneal dialysis catheter: Secondary | ICD-10-CM | POA: Diagnosis not present

## 2020-03-14 DIAGNOSIS — Z992 Dependence on renal dialysis: Secondary | ICD-10-CM | POA: Diagnosis not present

## 2020-03-14 DIAGNOSIS — N186 End stage renal disease: Secondary | ICD-10-CM | POA: Diagnosis not present

## 2020-03-15 DIAGNOSIS — Z992 Dependence on renal dialysis: Secondary | ICD-10-CM | POA: Diagnosis not present

## 2020-03-15 DIAGNOSIS — N186 End stage renal disease: Secondary | ICD-10-CM | POA: Diagnosis not present

## 2020-03-16 DIAGNOSIS — Z992 Dependence on renal dialysis: Secondary | ICD-10-CM | POA: Diagnosis not present

## 2020-03-16 DIAGNOSIS — N186 End stage renal disease: Secondary | ICD-10-CM | POA: Diagnosis not present

## 2020-03-17 DIAGNOSIS — Z992 Dependence on renal dialysis: Secondary | ICD-10-CM | POA: Diagnosis not present

## 2020-03-17 DIAGNOSIS — N186 End stage renal disease: Secondary | ICD-10-CM | POA: Diagnosis not present

## 2020-03-18 DIAGNOSIS — Z992 Dependence on renal dialysis: Secondary | ICD-10-CM | POA: Diagnosis not present

## 2020-03-18 DIAGNOSIS — N186 End stage renal disease: Secondary | ICD-10-CM | POA: Diagnosis not present

## 2020-03-19 DIAGNOSIS — N186 End stage renal disease: Secondary | ICD-10-CM | POA: Diagnosis not present

## 2020-03-19 DIAGNOSIS — Z992 Dependence on renal dialysis: Secondary | ICD-10-CM | POA: Diagnosis not present

## 2020-03-20 DIAGNOSIS — Z992 Dependence on renal dialysis: Secondary | ICD-10-CM | POA: Diagnosis not present

## 2020-03-20 DIAGNOSIS — N186 End stage renal disease: Secondary | ICD-10-CM | POA: Diagnosis not present

## 2020-03-21 DIAGNOSIS — N186 End stage renal disease: Secondary | ICD-10-CM | POA: Diagnosis not present

## 2020-03-21 DIAGNOSIS — Z992 Dependence on renal dialysis: Secondary | ICD-10-CM | POA: Diagnosis not present

## 2020-03-22 DIAGNOSIS — N186 End stage renal disease: Secondary | ICD-10-CM | POA: Diagnosis not present

## 2020-03-22 DIAGNOSIS — Z992 Dependence on renal dialysis: Secondary | ICD-10-CM | POA: Diagnosis not present

## 2020-03-23 DIAGNOSIS — Z992 Dependence on renal dialysis: Secondary | ICD-10-CM | POA: Diagnosis not present

## 2020-03-23 DIAGNOSIS — N186 End stage renal disease: Secondary | ICD-10-CM | POA: Diagnosis not present

## 2020-03-24 DIAGNOSIS — N186 End stage renal disease: Secondary | ICD-10-CM | POA: Diagnosis not present

## 2020-03-24 DIAGNOSIS — Z992 Dependence on renal dialysis: Secondary | ICD-10-CM | POA: Diagnosis not present

## 2020-03-25 DIAGNOSIS — N186 End stage renal disease: Secondary | ICD-10-CM | POA: Diagnosis not present

## 2020-03-25 DIAGNOSIS — Z992 Dependence on renal dialysis: Secondary | ICD-10-CM | POA: Diagnosis not present

## 2020-03-25 DIAGNOSIS — N2889 Other specified disorders of kidney and ureter: Secondary | ICD-10-CM | POA: Diagnosis not present

## 2020-03-25 DIAGNOSIS — N289 Disorder of kidney and ureter, unspecified: Secondary | ICD-10-CM | POA: Diagnosis not present

## 2020-03-26 DIAGNOSIS — N186 End stage renal disease: Secondary | ICD-10-CM | POA: Diagnosis not present

## 2020-03-26 DIAGNOSIS — Z992 Dependence on renal dialysis: Secondary | ICD-10-CM | POA: Diagnosis not present

## 2020-03-27 DIAGNOSIS — N186 End stage renal disease: Secondary | ICD-10-CM | POA: Diagnosis not present

## 2020-03-27 DIAGNOSIS — Z992 Dependence on renal dialysis: Secondary | ICD-10-CM | POA: Diagnosis not present

## 2020-03-28 DIAGNOSIS — D62 Acute posthemorrhagic anemia: Secondary | ICD-10-CM | POA: Diagnosis not present

## 2020-03-28 DIAGNOSIS — D849 Immunodeficiency, unspecified: Secondary | ICD-10-CM | POA: Diagnosis not present

## 2020-03-28 DIAGNOSIS — N186 End stage renal disease: Secondary | ICD-10-CM | POA: Diagnosis not present

## 2020-03-28 DIAGNOSIS — D689 Coagulation defect, unspecified: Secondary | ICD-10-CM | POA: Diagnosis not present

## 2020-03-28 DIAGNOSIS — E873 Alkalosis: Secondary | ICD-10-CM | POA: Diagnosis not present

## 2020-03-28 DIAGNOSIS — Z79899 Other long term (current) drug therapy: Secondary | ICD-10-CM | POA: Diagnosis not present

## 2020-03-28 DIAGNOSIS — E785 Hyperlipidemia, unspecified: Secondary | ICD-10-CM | POA: Diagnosis not present

## 2020-03-28 DIAGNOSIS — E119 Type 2 diabetes mellitus without complications: Secondary | ICD-10-CM | POA: Diagnosis not present

## 2020-03-28 DIAGNOSIS — D631 Anemia in chronic kidney disease: Secondary | ICD-10-CM | POA: Diagnosis not present

## 2020-03-28 DIAGNOSIS — Z940 Kidney transplant status: Secondary | ICD-10-CM | POA: Diagnosis not present

## 2020-03-28 DIAGNOSIS — Z794 Long term (current) use of insulin: Secondary | ICD-10-CM | POA: Diagnosis not present

## 2020-03-28 DIAGNOSIS — E8770 Fluid overload, unspecified: Secondary | ICD-10-CM | POA: Diagnosis not present

## 2020-03-28 DIAGNOSIS — N2581 Secondary hyperparathyroidism of renal origin: Secondary | ICD-10-CM | POA: Diagnosis not present

## 2020-03-28 DIAGNOSIS — Z4902 Encounter for fitting and adjustment of peritoneal dialysis catheter: Secondary | ICD-10-CM | POA: Diagnosis not present

## 2020-03-28 DIAGNOSIS — N170 Acute kidney failure with tubular necrosis: Secondary | ICD-10-CM | POA: Diagnosis not present

## 2020-03-28 DIAGNOSIS — Z7952 Long term (current) use of systemic steroids: Secondary | ICD-10-CM | POA: Diagnosis not present

## 2020-03-28 DIAGNOSIS — Z4822 Encounter for aftercare following kidney transplant: Secondary | ICD-10-CM | POA: Diagnosis not present

## 2020-03-28 DIAGNOSIS — Z992 Dependence on renal dialysis: Secondary | ICD-10-CM | POA: Diagnosis not present

## 2020-03-28 DIAGNOSIS — E039 Hypothyroidism, unspecified: Secondary | ICD-10-CM | POA: Diagnosis not present

## 2020-03-28 DIAGNOSIS — I517 Cardiomegaly: Secondary | ICD-10-CM | POA: Diagnosis not present

## 2020-03-28 DIAGNOSIS — Z01818 Encounter for other preprocedural examination: Secondary | ICD-10-CM | POA: Diagnosis not present

## 2020-03-28 DIAGNOSIS — E559 Vitamin D deficiency, unspecified: Secondary | ICD-10-CM | POA: Diagnosis not present

## 2020-03-28 DIAGNOSIS — Z4901 Encounter for fitting and adjustment of extracorporeal dialysis catheter: Secondary | ICD-10-CM | POA: Diagnosis not present

## 2020-03-28 DIAGNOSIS — Z7982 Long term (current) use of aspirin: Secondary | ICD-10-CM | POA: Diagnosis not present

## 2020-03-28 DIAGNOSIS — I120 Hypertensive chronic kidney disease with stage 5 chronic kidney disease or end stage renal disease: Secondary | ICD-10-CM | POA: Diagnosis not present

## 2020-03-28 DIAGNOSIS — Z8616 Personal history of COVID-19: Secondary | ICD-10-CM | POA: Diagnosis not present

## 2020-03-28 DIAGNOSIS — E1122 Type 2 diabetes mellitus with diabetic chronic kidney disease: Secondary | ICD-10-CM | POA: Diagnosis not present

## 2020-03-28 HISTORY — PX: KIDNEY TRANSPLANT: SHX239

## 2020-04-02 DIAGNOSIS — E1165 Type 2 diabetes mellitus with hyperglycemia: Secondary | ICD-10-CM | POA: Diagnosis not present

## 2020-04-02 DIAGNOSIS — E785 Hyperlipidemia, unspecified: Secondary | ICD-10-CM | POA: Diagnosis not present

## 2020-04-02 DIAGNOSIS — E119 Type 2 diabetes mellitus without complications: Secondary | ICD-10-CM | POA: Diagnosis not present

## 2020-04-02 DIAGNOSIS — I10 Essential (primary) hypertension: Secondary | ICD-10-CM | POA: Diagnosis not present

## 2020-04-02 DIAGNOSIS — D649 Anemia, unspecified: Secondary | ICD-10-CM | POA: Diagnosis not present

## 2020-04-02 DIAGNOSIS — Z79899 Other long term (current) drug therapy: Secondary | ICD-10-CM | POA: Diagnosis not present

## 2020-04-02 DIAGNOSIS — D849 Immunodeficiency, unspecified: Secondary | ICD-10-CM | POA: Diagnosis not present

## 2020-04-02 DIAGNOSIS — Z940 Kidney transplant status: Secondary | ICD-10-CM | POA: Diagnosis not present

## 2020-04-02 DIAGNOSIS — Z794 Long term (current) use of insulin: Secondary | ICD-10-CM | POA: Diagnosis not present

## 2020-04-02 DIAGNOSIS — Z4822 Encounter for aftercare following kidney transplant: Secondary | ICD-10-CM | POA: Diagnosis not present

## 2020-04-04 DIAGNOSIS — Z4822 Encounter for aftercare following kidney transplant: Secondary | ICD-10-CM | POA: Diagnosis not present

## 2020-04-04 DIAGNOSIS — Z940 Kidney transplant status: Secondary | ICD-10-CM | POA: Diagnosis not present

## 2020-04-07 DIAGNOSIS — Z7952 Long term (current) use of systemic steroids: Secondary | ICD-10-CM | POA: Diagnosis not present

## 2020-04-07 DIAGNOSIS — I151 Hypertension secondary to other renal disorders: Secondary | ICD-10-CM | POA: Diagnosis not present

## 2020-04-07 DIAGNOSIS — Z794 Long term (current) use of insulin: Secondary | ICD-10-CM | POA: Diagnosis not present

## 2020-04-07 DIAGNOSIS — D649 Anemia, unspecified: Secondary | ICD-10-CM | POA: Diagnosis not present

## 2020-04-07 DIAGNOSIS — Z79899 Other long term (current) drug therapy: Secondary | ICD-10-CM | POA: Diagnosis not present

## 2020-04-07 DIAGNOSIS — E785 Hyperlipidemia, unspecified: Secondary | ICD-10-CM | POA: Diagnosis not present

## 2020-04-07 DIAGNOSIS — E1165 Type 2 diabetes mellitus with hyperglycemia: Secondary | ICD-10-CM | POA: Diagnosis not present

## 2020-04-07 DIAGNOSIS — Z940 Kidney transplant status: Secondary | ICD-10-CM | POA: Diagnosis not present

## 2020-04-07 DIAGNOSIS — E1159 Type 2 diabetes mellitus with other circulatory complications: Secondary | ICD-10-CM | POA: Diagnosis not present

## 2020-04-07 DIAGNOSIS — Z4822 Encounter for aftercare following kidney transplant: Secondary | ICD-10-CM | POA: Diagnosis not present

## 2020-04-08 DIAGNOSIS — Z992 Dependence on renal dialysis: Secondary | ICD-10-CM | POA: Diagnosis not present

## 2020-04-08 DIAGNOSIS — N186 End stage renal disease: Secondary | ICD-10-CM | POA: Diagnosis not present

## 2020-04-08 DIAGNOSIS — E1122 Type 2 diabetes mellitus with diabetic chronic kidney disease: Secondary | ICD-10-CM | POA: Diagnosis not present

## 2020-04-10 DIAGNOSIS — D849 Immunodeficiency, unspecified: Secondary | ICD-10-CM | POA: Diagnosis not present

## 2020-04-10 DIAGNOSIS — I10 Essential (primary) hypertension: Secondary | ICD-10-CM | POA: Diagnosis not present

## 2020-04-10 DIAGNOSIS — Z4822 Encounter for aftercare following kidney transplant: Secondary | ICD-10-CM | POA: Diagnosis not present

## 2020-04-10 DIAGNOSIS — Z940 Kidney transplant status: Secondary | ICD-10-CM | POA: Diagnosis not present

## 2020-04-10 DIAGNOSIS — E1122 Type 2 diabetes mellitus with diabetic chronic kidney disease: Secondary | ICD-10-CM | POA: Diagnosis not present

## 2020-04-14 DIAGNOSIS — Z862 Personal history of diseases of the blood and blood-forming organs and certain disorders involving the immune mechanism: Secondary | ICD-10-CM | POA: Diagnosis not present

## 2020-04-14 DIAGNOSIS — Z792 Long term (current) use of antibiotics: Secondary | ICD-10-CM | POA: Diagnosis not present

## 2020-04-14 DIAGNOSIS — E785 Hyperlipidemia, unspecified: Secondary | ICD-10-CM | POA: Diagnosis not present

## 2020-04-14 DIAGNOSIS — E1122 Type 2 diabetes mellitus with diabetic chronic kidney disease: Secondary | ICD-10-CM | POA: Diagnosis not present

## 2020-04-14 DIAGNOSIS — R9439 Abnormal result of other cardiovascular function study: Secondary | ICD-10-CM | POA: Diagnosis not present

## 2020-04-14 DIAGNOSIS — Z79899 Other long term (current) drug therapy: Secondary | ICD-10-CM | POA: Diagnosis not present

## 2020-04-14 DIAGNOSIS — Z940 Kidney transplant status: Secondary | ICD-10-CM | POA: Diagnosis not present

## 2020-04-14 DIAGNOSIS — B965 Pseudomonas (aeruginosa) (mallei) (pseudomallei) as the cause of diseases classified elsewhere: Secondary | ICD-10-CM | POA: Diagnosis not present

## 2020-04-14 DIAGNOSIS — Z9889 Other specified postprocedural states: Secondary | ICD-10-CM | POA: Diagnosis not present

## 2020-04-14 DIAGNOSIS — Z955 Presence of coronary angioplasty implant and graft: Secondary | ICD-10-CM | POA: Diagnosis not present

## 2020-04-14 DIAGNOSIS — E1165 Type 2 diabetes mellitus with hyperglycemia: Secondary | ICD-10-CM | POA: Diagnosis not present

## 2020-04-14 DIAGNOSIS — I10 Essential (primary) hypertension: Secondary | ICD-10-CM | POA: Diagnosis not present

## 2020-04-14 DIAGNOSIS — N390 Urinary tract infection, site not specified: Secondary | ICD-10-CM | POA: Diagnosis not present

## 2020-04-14 DIAGNOSIS — Z7952 Long term (current) use of systemic steroids: Secondary | ICD-10-CM | POA: Diagnosis not present

## 2020-04-14 DIAGNOSIS — Z4822 Encounter for aftercare following kidney transplant: Secondary | ICD-10-CM | POA: Diagnosis not present

## 2020-04-14 DIAGNOSIS — R110 Nausea: Secondary | ICD-10-CM | POA: Diagnosis not present

## 2020-04-14 DIAGNOSIS — D849 Immunodeficiency, unspecified: Secondary | ICD-10-CM | POA: Diagnosis not present

## 2020-04-14 DIAGNOSIS — Z794 Long term (current) use of insulin: Secondary | ICD-10-CM | POA: Diagnosis not present

## 2020-04-15 DIAGNOSIS — R110 Nausea: Secondary | ICD-10-CM | POA: Diagnosis not present

## 2020-04-15 DIAGNOSIS — Z79899 Other long term (current) drug therapy: Secondary | ICD-10-CM | POA: Diagnosis not present

## 2020-04-15 DIAGNOSIS — E785 Hyperlipidemia, unspecified: Secondary | ICD-10-CM | POA: Diagnosis not present

## 2020-04-15 DIAGNOSIS — D649 Anemia, unspecified: Secondary | ICD-10-CM | POA: Diagnosis not present

## 2020-04-15 DIAGNOSIS — Z794 Long term (current) use of insulin: Secondary | ICD-10-CM | POA: Diagnosis not present

## 2020-04-15 DIAGNOSIS — Z4822 Encounter for aftercare following kidney transplant: Secondary | ICD-10-CM | POA: Diagnosis not present

## 2020-04-15 DIAGNOSIS — D849 Immunodeficiency, unspecified: Secondary | ICD-10-CM | POA: Diagnosis not present

## 2020-04-15 DIAGNOSIS — I10 Essential (primary) hypertension: Secondary | ICD-10-CM | POA: Diagnosis not present

## 2020-04-15 DIAGNOSIS — Z87440 Personal history of urinary (tract) infections: Secondary | ICD-10-CM | POA: Diagnosis not present

## 2020-04-15 DIAGNOSIS — Z5181 Encounter for therapeutic drug level monitoring: Secondary | ICD-10-CM | POA: Diagnosis not present

## 2020-04-15 DIAGNOSIS — Z940 Kidney transplant status: Secondary | ICD-10-CM | POA: Diagnosis not present

## 2020-04-15 DIAGNOSIS — Z7952 Long term (current) use of systemic steroids: Secondary | ICD-10-CM | POA: Diagnosis not present

## 2020-04-15 DIAGNOSIS — D72829 Elevated white blood cell count, unspecified: Secondary | ICD-10-CM | POA: Diagnosis not present

## 2020-04-15 DIAGNOSIS — E1165 Type 2 diabetes mellitus with hyperglycemia: Secondary | ICD-10-CM | POA: Diagnosis not present

## 2020-04-15 DIAGNOSIS — Z792 Long term (current) use of antibiotics: Secondary | ICD-10-CM | POA: Diagnosis not present

## 2020-04-17 DIAGNOSIS — D631 Anemia in chronic kidney disease: Secondary | ICD-10-CM | POA: Diagnosis not present

## 2020-04-17 DIAGNOSIS — Z940 Kidney transplant status: Secondary | ICD-10-CM | POA: Diagnosis not present

## 2020-04-17 DIAGNOSIS — Z4822 Encounter for aftercare following kidney transplant: Secondary | ICD-10-CM | POA: Diagnosis not present

## 2020-04-17 DIAGNOSIS — N390 Urinary tract infection, site not specified: Secondary | ICD-10-CM | POA: Diagnosis not present

## 2020-04-17 DIAGNOSIS — E785 Hyperlipidemia, unspecified: Secondary | ICD-10-CM | POA: Diagnosis not present

## 2020-04-17 DIAGNOSIS — Z7952 Long term (current) use of systemic steroids: Secondary | ICD-10-CM | POA: Diagnosis not present

## 2020-04-17 DIAGNOSIS — I120 Hypertensive chronic kidney disease with stage 5 chronic kidney disease or end stage renal disease: Secondary | ICD-10-CM | POA: Diagnosis not present

## 2020-04-17 DIAGNOSIS — D72829 Elevated white blood cell count, unspecified: Secondary | ICD-10-CM | POA: Diagnosis not present

## 2020-04-17 DIAGNOSIS — Z79899 Other long term (current) drug therapy: Secondary | ICD-10-CM | POA: Diagnosis not present

## 2020-04-17 DIAGNOSIS — D72819 Decreased white blood cell count, unspecified: Secondary | ICD-10-CM | POA: Diagnosis not present

## 2020-04-17 DIAGNOSIS — N186 End stage renal disease: Secondary | ICD-10-CM | POA: Diagnosis not present

## 2020-04-17 DIAGNOSIS — Z5181 Encounter for therapeutic drug level monitoring: Secondary | ICD-10-CM | POA: Diagnosis not present

## 2020-04-17 DIAGNOSIS — Z794 Long term (current) use of insulin: Secondary | ICD-10-CM | POA: Diagnosis not present

## 2020-04-17 DIAGNOSIS — R110 Nausea: Secondary | ICD-10-CM | POA: Diagnosis not present

## 2020-04-17 DIAGNOSIS — E1122 Type 2 diabetes mellitus with diabetic chronic kidney disease: Secondary | ICD-10-CM | POA: Diagnosis not present

## 2020-04-17 DIAGNOSIS — D849 Immunodeficiency, unspecified: Secondary | ICD-10-CM | POA: Diagnosis not present

## 2020-04-21 DIAGNOSIS — D84821 Immunodeficiency due to drugs: Secondary | ICD-10-CM | POA: Diagnosis not present

## 2020-04-21 DIAGNOSIS — Z4822 Encounter for aftercare following kidney transplant: Secondary | ICD-10-CM | POA: Diagnosis not present

## 2020-04-21 DIAGNOSIS — Z79899 Other long term (current) drug therapy: Secondary | ICD-10-CM | POA: Diagnosis not present

## 2020-04-21 DIAGNOSIS — Z940 Kidney transplant status: Secondary | ICD-10-CM | POA: Diagnosis not present

## 2020-04-24 DIAGNOSIS — Z862 Personal history of diseases of the blood and blood-forming organs and certain disorders involving the immune mechanism: Secondary | ICD-10-CM | POA: Diagnosis not present

## 2020-04-24 DIAGNOSIS — Z7952 Long term (current) use of systemic steroids: Secondary | ICD-10-CM | POA: Diagnosis not present

## 2020-04-24 DIAGNOSIS — E119 Type 2 diabetes mellitus without complications: Secondary | ICD-10-CM | POA: Diagnosis not present

## 2020-04-24 DIAGNOSIS — Z794 Long term (current) use of insulin: Secondary | ICD-10-CM | POA: Diagnosis not present

## 2020-04-24 DIAGNOSIS — Z79899 Other long term (current) drug therapy: Secondary | ICD-10-CM | POA: Diagnosis not present

## 2020-04-24 DIAGNOSIS — R110 Nausea: Secondary | ICD-10-CM | POA: Diagnosis not present

## 2020-04-24 DIAGNOSIS — D649 Anemia, unspecified: Secondary | ICD-10-CM | POA: Diagnosis not present

## 2020-04-24 DIAGNOSIS — Z4822 Encounter for aftercare following kidney transplant: Secondary | ICD-10-CM | POA: Diagnosis not present

## 2020-04-24 DIAGNOSIS — Z792 Long term (current) use of antibiotics: Secondary | ICD-10-CM | POA: Diagnosis not present

## 2020-04-24 DIAGNOSIS — E785 Hyperlipidemia, unspecified: Secondary | ICD-10-CM | POA: Diagnosis not present

## 2020-04-24 DIAGNOSIS — Z87440 Personal history of urinary (tract) infections: Secondary | ICD-10-CM | POA: Diagnosis not present

## 2020-04-24 DIAGNOSIS — I10 Essential (primary) hypertension: Secondary | ICD-10-CM | POA: Diagnosis not present

## 2020-04-29 DIAGNOSIS — Z4822 Encounter for aftercare following kidney transplant: Secondary | ICD-10-CM | POA: Diagnosis not present

## 2020-04-29 DIAGNOSIS — Z794 Long term (current) use of insulin: Secondary | ICD-10-CM | POA: Diagnosis not present

## 2020-04-29 DIAGNOSIS — Z792 Long term (current) use of antibiotics: Secondary | ICD-10-CM | POA: Diagnosis not present

## 2020-04-29 DIAGNOSIS — E1165 Type 2 diabetes mellitus with hyperglycemia: Secondary | ICD-10-CM | POA: Diagnosis not present

## 2020-04-29 DIAGNOSIS — E785 Hyperlipidemia, unspecified: Secondary | ICD-10-CM | POA: Diagnosis not present

## 2020-04-29 DIAGNOSIS — Z87440 Personal history of urinary (tract) infections: Secondary | ICD-10-CM | POA: Diagnosis not present

## 2020-04-29 DIAGNOSIS — I10 Essential (primary) hypertension: Secondary | ICD-10-CM | POA: Diagnosis not present

## 2020-04-29 DIAGNOSIS — Z940 Kidney transplant status: Secondary | ICD-10-CM | POA: Diagnosis not present

## 2020-04-29 DIAGNOSIS — E1122 Type 2 diabetes mellitus with diabetic chronic kidney disease: Secondary | ICD-10-CM | POA: Diagnosis not present

## 2020-04-29 DIAGNOSIS — R110 Nausea: Secondary | ICD-10-CM | POA: Diagnosis not present

## 2020-04-29 DIAGNOSIS — N184 Chronic kidney disease, stage 4 (severe): Secondary | ICD-10-CM | POA: Diagnosis not present

## 2020-04-29 DIAGNOSIS — D649 Anemia, unspecified: Secondary | ICD-10-CM | POA: Diagnosis not present

## 2020-04-29 DIAGNOSIS — Z5181 Encounter for therapeutic drug level monitoring: Secondary | ICD-10-CM | POA: Diagnosis not present

## 2020-04-29 DIAGNOSIS — Z79899 Other long term (current) drug therapy: Secondary | ICD-10-CM | POA: Diagnosis not present

## 2020-04-29 DIAGNOSIS — Z466 Encounter for fitting and adjustment of urinary device: Secondary | ICD-10-CM | POA: Diagnosis not present

## 2020-04-29 DIAGNOSIS — Z7952 Long term (current) use of systemic steroids: Secondary | ICD-10-CM | POA: Diagnosis not present

## 2020-04-29 DIAGNOSIS — D849 Immunodeficiency, unspecified: Secondary | ICD-10-CM | POA: Diagnosis not present

## 2020-05-01 DIAGNOSIS — D8989 Other specified disorders involving the immune mechanism, not elsewhere classified: Secondary | ICD-10-CM | POA: Diagnosis not present

## 2020-05-01 DIAGNOSIS — D849 Immunodeficiency, unspecified: Secondary | ICD-10-CM | POA: Diagnosis not present

## 2020-05-01 DIAGNOSIS — I10 Essential (primary) hypertension: Secondary | ICD-10-CM | POA: Diagnosis not present

## 2020-05-01 DIAGNOSIS — Z940 Kidney transplant status: Secondary | ICD-10-CM | POA: Diagnosis not present

## 2020-05-01 DIAGNOSIS — E119 Type 2 diabetes mellitus without complications: Secondary | ICD-10-CM | POA: Diagnosis not present

## 2020-05-01 DIAGNOSIS — Z9889 Other specified postprocedural states: Secondary | ICD-10-CM | POA: Diagnosis not present

## 2020-05-06 DIAGNOSIS — N269 Renal sclerosis, unspecified: Secondary | ICD-10-CM | POA: Diagnosis not present

## 2020-05-06 DIAGNOSIS — N170 Acute kidney failure with tubular necrosis: Secondary | ICD-10-CM | POA: Diagnosis not present

## 2020-05-06 DIAGNOSIS — N043 Nephrotic syndrome with diffuse mesangial proliferative glomerulonephritis: Secondary | ICD-10-CM | POA: Diagnosis not present

## 2020-05-06 DIAGNOSIS — Z940 Kidney transplant status: Secondary | ICD-10-CM | POA: Diagnosis not present

## 2020-05-06 DIAGNOSIS — Z4822 Encounter for aftercare following kidney transplant: Secondary | ICD-10-CM | POA: Diagnosis not present

## 2020-05-06 DIAGNOSIS — N051 Unspecified nephritic syndrome with focal and segmental glomerular lesions: Secondary | ICD-10-CM | POA: Diagnosis not present

## 2020-05-08 DIAGNOSIS — E785 Hyperlipidemia, unspecified: Secondary | ICD-10-CM | POA: Diagnosis not present

## 2020-05-08 DIAGNOSIS — Z792 Long term (current) use of antibiotics: Secondary | ICD-10-CM | POA: Diagnosis not present

## 2020-05-08 DIAGNOSIS — Z7952 Long term (current) use of systemic steroids: Secondary | ICD-10-CM | POA: Diagnosis not present

## 2020-05-08 DIAGNOSIS — Z4822 Encounter for aftercare following kidney transplant: Secondary | ICD-10-CM | POA: Diagnosis not present

## 2020-05-08 DIAGNOSIS — I10 Essential (primary) hypertension: Secondary | ICD-10-CM | POA: Diagnosis not present

## 2020-05-08 DIAGNOSIS — B965 Pseudomonas (aeruginosa) (mallei) (pseudomallei) as the cause of diseases classified elsewhere: Secondary | ICD-10-CM | POA: Diagnosis not present

## 2020-05-08 DIAGNOSIS — R911 Solitary pulmonary nodule: Secondary | ICD-10-CM | POA: Diagnosis not present

## 2020-05-08 DIAGNOSIS — E1165 Type 2 diabetes mellitus with hyperglycemia: Secondary | ICD-10-CM | POA: Diagnosis not present

## 2020-05-08 DIAGNOSIS — N269 Renal sclerosis, unspecified: Secondary | ICD-10-CM | POA: Diagnosis not present

## 2020-05-08 DIAGNOSIS — Z794 Long term (current) use of insulin: Secondary | ICD-10-CM | POA: Diagnosis not present

## 2020-05-08 DIAGNOSIS — R110 Nausea: Secondary | ICD-10-CM | POA: Diagnosis not present

## 2020-05-08 DIAGNOSIS — N390 Urinary tract infection, site not specified: Secondary | ICD-10-CM | POA: Diagnosis not present

## 2020-05-08 DIAGNOSIS — Z940 Kidney transplant status: Secondary | ICD-10-CM | POA: Diagnosis not present

## 2020-05-08 DIAGNOSIS — Z1159 Encounter for screening for other viral diseases: Secondary | ICD-10-CM | POA: Diagnosis not present

## 2020-05-08 DIAGNOSIS — Z5181 Encounter for therapeutic drug level monitoring: Secondary | ICD-10-CM | POA: Diagnosis not present

## 2020-05-08 DIAGNOSIS — N179 Acute kidney failure, unspecified: Secondary | ICD-10-CM | POA: Diagnosis not present

## 2020-05-08 DIAGNOSIS — Z79899 Other long term (current) drug therapy: Secondary | ICD-10-CM | POA: Diagnosis not present

## 2020-05-14 DIAGNOSIS — I10 Essential (primary) hypertension: Secondary | ICD-10-CM | POA: Diagnosis not present

## 2020-05-14 DIAGNOSIS — E1122 Type 2 diabetes mellitus with diabetic chronic kidney disease: Secondary | ICD-10-CM | POA: Diagnosis not present

## 2020-05-14 DIAGNOSIS — Z87440 Personal history of urinary (tract) infections: Secondary | ICD-10-CM | POA: Diagnosis not present

## 2020-05-14 DIAGNOSIS — E785 Hyperlipidemia, unspecified: Secondary | ICD-10-CM | POA: Diagnosis not present

## 2020-05-14 DIAGNOSIS — B9789 Other viral agents as the cause of diseases classified elsewhere: Secondary | ICD-10-CM | POA: Diagnosis not present

## 2020-05-14 DIAGNOSIS — R911 Solitary pulmonary nodule: Secondary | ICD-10-CM | POA: Diagnosis not present

## 2020-05-14 DIAGNOSIS — Z7952 Long term (current) use of systemic steroids: Secondary | ICD-10-CM | POA: Diagnosis not present

## 2020-05-14 DIAGNOSIS — E119 Type 2 diabetes mellitus without complications: Secondary | ICD-10-CM | POA: Diagnosis not present

## 2020-05-14 DIAGNOSIS — Z940 Kidney transplant status: Secondary | ICD-10-CM | POA: Diagnosis not present

## 2020-05-14 DIAGNOSIS — Z792 Long term (current) use of antibiotics: Secondary | ICD-10-CM | POA: Diagnosis not present

## 2020-05-14 DIAGNOSIS — R110 Nausea: Secondary | ICD-10-CM | POA: Diagnosis not present

## 2020-05-14 DIAGNOSIS — Z79899 Other long term (current) drug therapy: Secondary | ICD-10-CM | POA: Diagnosis not present

## 2020-05-14 DIAGNOSIS — D849 Immunodeficiency, unspecified: Secondary | ICD-10-CM | POA: Diagnosis not present

## 2020-05-14 DIAGNOSIS — D649 Anemia, unspecified: Secondary | ICD-10-CM | POA: Diagnosis not present

## 2020-05-14 DIAGNOSIS — N2889 Other specified disorders of kidney and ureter: Secondary | ICD-10-CM | POA: Diagnosis not present

## 2020-05-14 DIAGNOSIS — Z4822 Encounter for aftercare following kidney transplant: Secondary | ICD-10-CM | POA: Diagnosis not present

## 2020-05-14 DIAGNOSIS — Z794 Long term (current) use of insulin: Secondary | ICD-10-CM | POA: Diagnosis not present

## 2020-05-21 DIAGNOSIS — R911 Solitary pulmonary nodule: Secondary | ICD-10-CM | POA: Diagnosis not present

## 2020-05-21 DIAGNOSIS — E785 Hyperlipidemia, unspecified: Secondary | ICD-10-CM | POA: Diagnosis not present

## 2020-05-21 DIAGNOSIS — Z940 Kidney transplant status: Secondary | ICD-10-CM | POA: Diagnosis not present

## 2020-05-21 DIAGNOSIS — E119 Type 2 diabetes mellitus without complications: Secondary | ICD-10-CM | POA: Diagnosis not present

## 2020-05-21 DIAGNOSIS — E1122 Type 2 diabetes mellitus with diabetic chronic kidney disease: Secondary | ICD-10-CM | POA: Diagnosis not present

## 2020-05-21 DIAGNOSIS — Z5181 Encounter for therapeutic drug level monitoring: Secondary | ICD-10-CM | POA: Diagnosis not present

## 2020-05-21 DIAGNOSIS — R110 Nausea: Secondary | ICD-10-CM | POA: Diagnosis not present

## 2020-05-21 DIAGNOSIS — I10 Essential (primary) hypertension: Secondary | ICD-10-CM | POA: Diagnosis not present

## 2020-05-21 DIAGNOSIS — N289 Disorder of kidney and ureter, unspecified: Secondary | ICD-10-CM | POA: Diagnosis not present

## 2020-05-21 DIAGNOSIS — Z7952 Long term (current) use of systemic steroids: Secondary | ICD-10-CM | POA: Diagnosis not present

## 2020-05-21 DIAGNOSIS — Z87440 Personal history of urinary (tract) infections: Secondary | ICD-10-CM | POA: Diagnosis not present

## 2020-05-21 DIAGNOSIS — Z79899 Other long term (current) drug therapy: Secondary | ICD-10-CM | POA: Diagnosis not present

## 2020-05-21 DIAGNOSIS — D649 Anemia, unspecified: Secondary | ICD-10-CM | POA: Diagnosis not present

## 2020-05-21 DIAGNOSIS — Z794 Long term (current) use of insulin: Secondary | ICD-10-CM | POA: Diagnosis not present

## 2020-05-21 DIAGNOSIS — Z4822 Encounter for aftercare following kidney transplant: Secondary | ICD-10-CM | POA: Diagnosis not present

## 2020-05-24 ENCOUNTER — Other Ambulatory Visit: Payer: BLUE CROSS/BLUE SHIELD

## 2020-05-24 ENCOUNTER — Other Ambulatory Visit: Payer: Self-pay

## 2020-05-24 DIAGNOSIS — Z20822 Contact with and (suspected) exposure to covid-19: Secondary | ICD-10-CM

## 2020-05-28 DIAGNOSIS — Z7952 Long term (current) use of systemic steroids: Secondary | ICD-10-CM | POA: Diagnosis not present

## 2020-05-28 DIAGNOSIS — Z4822 Encounter for aftercare following kidney transplant: Secondary | ICD-10-CM | POA: Diagnosis not present

## 2020-05-28 DIAGNOSIS — E119 Type 2 diabetes mellitus without complications: Secondary | ICD-10-CM | POA: Diagnosis not present

## 2020-05-28 DIAGNOSIS — N289 Disorder of kidney and ureter, unspecified: Secondary | ICD-10-CM | POA: Diagnosis not present

## 2020-05-28 DIAGNOSIS — I10 Essential (primary) hypertension: Secondary | ICD-10-CM | POA: Diagnosis not present

## 2020-05-28 DIAGNOSIS — D849 Immunodeficiency, unspecified: Secondary | ICD-10-CM | POA: Diagnosis not present

## 2020-05-28 DIAGNOSIS — Z87440 Personal history of urinary (tract) infections: Secondary | ICD-10-CM | POA: Diagnosis not present

## 2020-05-28 DIAGNOSIS — Z940 Kidney transplant status: Secondary | ICD-10-CM | POA: Diagnosis not present

## 2020-05-28 DIAGNOSIS — Z792 Long term (current) use of antibiotics: Secondary | ICD-10-CM | POA: Diagnosis not present

## 2020-05-28 DIAGNOSIS — Z79899 Other long term (current) drug therapy: Secondary | ICD-10-CM | POA: Diagnosis not present

## 2020-05-28 DIAGNOSIS — E1122 Type 2 diabetes mellitus with diabetic chronic kidney disease: Secondary | ICD-10-CM | POA: Diagnosis not present

## 2020-05-28 DIAGNOSIS — Z794 Long term (current) use of insulin: Secondary | ICD-10-CM | POA: Diagnosis not present

## 2020-05-28 DIAGNOSIS — D649 Anemia, unspecified: Secondary | ICD-10-CM | POA: Diagnosis not present

## 2020-05-28 DIAGNOSIS — R911 Solitary pulmonary nodule: Secondary | ICD-10-CM | POA: Diagnosis not present

## 2020-05-28 DIAGNOSIS — E785 Hyperlipidemia, unspecified: Secondary | ICD-10-CM | POA: Diagnosis not present

## 2020-05-28 DIAGNOSIS — R110 Nausea: Secondary | ICD-10-CM | POA: Diagnosis not present

## 2020-05-28 DIAGNOSIS — B9789 Other viral agents as the cause of diseases classified elsewhere: Secondary | ICD-10-CM | POA: Diagnosis not present

## 2020-06-04 DIAGNOSIS — Z862 Personal history of diseases of the blood and blood-forming organs and certain disorders involving the immune mechanism: Secondary | ICD-10-CM | POA: Diagnosis not present

## 2020-06-04 DIAGNOSIS — Z79899 Other long term (current) drug therapy: Secondary | ICD-10-CM | POA: Diagnosis not present

## 2020-06-04 DIAGNOSIS — Z794 Long term (current) use of insulin: Secondary | ICD-10-CM | POA: Diagnosis not present

## 2020-06-04 DIAGNOSIS — E119 Type 2 diabetes mellitus without complications: Secondary | ICD-10-CM | POA: Diagnosis not present

## 2020-06-04 DIAGNOSIS — E785 Hyperlipidemia, unspecified: Secondary | ICD-10-CM | POA: Diagnosis not present

## 2020-06-04 DIAGNOSIS — Z4822 Encounter for aftercare following kidney transplant: Secondary | ICD-10-CM | POA: Diagnosis not present

## 2020-06-04 DIAGNOSIS — Z792 Long term (current) use of antibiotics: Secondary | ICD-10-CM | POA: Diagnosis not present

## 2020-06-04 DIAGNOSIS — F439 Reaction to severe stress, unspecified: Secondary | ICD-10-CM | POA: Diagnosis not present

## 2020-06-04 DIAGNOSIS — Z9889 Other specified postprocedural states: Secondary | ICD-10-CM | POA: Diagnosis not present

## 2020-06-04 DIAGNOSIS — I10 Essential (primary) hypertension: Secondary | ICD-10-CM | POA: Diagnosis not present

## 2020-06-04 DIAGNOSIS — Z7952 Long term (current) use of systemic steroids: Secondary | ICD-10-CM | POA: Diagnosis not present

## 2020-06-12 DIAGNOSIS — Z79899 Other long term (current) drug therapy: Secondary | ICD-10-CM | POA: Diagnosis not present

## 2020-06-12 DIAGNOSIS — Z940 Kidney transplant status: Secondary | ICD-10-CM | POA: Diagnosis not present

## 2020-06-12 DIAGNOSIS — E1122 Type 2 diabetes mellitus with diabetic chronic kidney disease: Secondary | ICD-10-CM | POA: Diagnosis not present

## 2020-06-12 DIAGNOSIS — Z4822 Encounter for aftercare following kidney transplant: Secondary | ICD-10-CM | POA: Diagnosis not present

## 2020-06-12 DIAGNOSIS — D849 Immunodeficiency, unspecified: Secondary | ICD-10-CM | POA: Diagnosis not present

## 2020-06-12 DIAGNOSIS — E119 Type 2 diabetes mellitus without complications: Secondary | ICD-10-CM | POA: Diagnosis not present

## 2020-06-12 DIAGNOSIS — Z792 Long term (current) use of antibiotics: Secondary | ICD-10-CM | POA: Diagnosis not present

## 2020-06-12 DIAGNOSIS — Z7952 Long term (current) use of systemic steroids: Secondary | ICD-10-CM | POA: Diagnosis not present

## 2020-06-12 DIAGNOSIS — I10 Essential (primary) hypertension: Secondary | ICD-10-CM | POA: Diagnosis not present

## 2020-06-12 DIAGNOSIS — E785 Hyperlipidemia, unspecified: Secondary | ICD-10-CM | POA: Diagnosis not present

## 2020-06-12 DIAGNOSIS — D649 Anemia, unspecified: Secondary | ICD-10-CM | POA: Diagnosis not present

## 2020-06-12 DIAGNOSIS — Z794 Long term (current) use of insulin: Secondary | ICD-10-CM | POA: Diagnosis not present

## 2020-06-17 DIAGNOSIS — E1122 Type 2 diabetes mellitus with diabetic chronic kidney disease: Secondary | ICD-10-CM | POA: Diagnosis not present

## 2020-06-17 DIAGNOSIS — Z794 Long term (current) use of insulin: Secondary | ICD-10-CM | POA: Diagnosis not present

## 2020-06-25 ENCOUNTER — Other Ambulatory Visit: Payer: Self-pay

## 2020-06-25 ENCOUNTER — Ambulatory Visit (INDEPENDENT_AMBULATORY_CARE_PROVIDER_SITE_OTHER): Payer: BC Managed Care – PPO | Admitting: Nurse Practitioner

## 2020-06-25 ENCOUNTER — Encounter: Payer: Self-pay | Admitting: Nurse Practitioner

## 2020-06-25 VITALS — BP 122/60 | HR 84 | Temp 98.4°F | Ht 68.0 in | Wt 183.6 lb

## 2020-06-25 DIAGNOSIS — Z940 Kidney transplant status: Secondary | ICD-10-CM

## 2020-06-25 DIAGNOSIS — E663 Overweight: Secondary | ICD-10-CM

## 2020-06-25 DIAGNOSIS — E1122 Type 2 diabetes mellitus with diabetic chronic kidney disease: Secondary | ICD-10-CM

## 2020-06-25 DIAGNOSIS — Z794 Long term (current) use of insulin: Secondary | ICD-10-CM

## 2020-06-25 DIAGNOSIS — I129 Hypertensive chronic kidney disease with stage 1 through stage 4 chronic kidney disease, or unspecified chronic kidney disease: Secondary | ICD-10-CM | POA: Diagnosis not present

## 2020-06-25 DIAGNOSIS — N184 Chronic kidney disease, stage 4 (severe): Secondary | ICD-10-CM

## 2020-06-25 DIAGNOSIS — Z6827 Body mass index (BMI) 27.0-27.9, adult: Secondary | ICD-10-CM

## 2020-06-25 LAB — POCT URINALYSIS DIPSTICK
Bilirubin, UA: NEGATIVE
Blood, UA: NEGATIVE
Glucose, UA: POSITIVE — AB
Ketones, UA: NEGATIVE
Nitrite, UA: NEGATIVE
Protein, UA: POSITIVE — AB
Spec Grav, UA: 1.025 (ref 1.010–1.025)
Urobilinogen, UA: 0.2 E.U./dL
pH, UA: 5.5 (ref 5.0–8.0)

## 2020-06-25 LAB — POCT UA - MICROALBUMIN
Albumin/Creatinine Ratio, Urine, POC: 300
Creatinine, POC: 300 mg/dL
Microalbumin Ur, POC: 80 mg/L

## 2020-06-25 MED ORDER — INSULIN LISPRO (1 UNIT DIAL) 100 UNIT/ML (KWIKPEN): PEN_INJECTOR | SUBCUTANEOUS | 11 refills | Status: DC

## 2020-06-25 MED ORDER — LANTUS SOLOSTAR 100 UNIT/ML ~~LOC~~ SOPN: 20.0000 [IU] | PEN_INJECTOR | Freq: Every day | SUBCUTANEOUS | 11 refills | Status: DC

## 2020-06-25 NOTE — Progress Notes (Signed)
I,Tianna Badgett,acting as a Education administrator for [employer], FNP.,have documented all relevant documentation on the behalf of Samantha Brine, FNP,as directed by  Samantha Brine, FNP while in the presence of Samantha [name], [place].  This visit occurred during the SARS-CoV-2 public health emergency.  Safety protocols were in place, including screening questions prior to the visit, additional usage of staff PPE, and extensive cleaning of exam room while observing appropriate contact time as indicated for disinfecting solutions.  Subjective:     Patient ID: Samantha [name] , female    DOB: Jul 14, 1965 , 55 y.o.   MRN: [id number]   Chief Complaint  Patient presents with  . Diabetes  . Hypertension    HPI  Patient is here for a diabetes and hypertension follow up.   She goes to [facility] - once every other week.  March 28, 2020 she had her transplant, she is getting tired easily  Diabetes She presents for her follow-up diabetic visit. She has type 2 diabetes mellitus. Her disease course has been stable. There are no hypoglycemic associated symptoms. Pertinent negatives for diabetes include no blurred vision and no chest pain. There are no hypoglycemic complications. Symptoms are improving. Diabetic complications include nephropathy. Risk factors for coronary artery disease include diabetes mellitus, dyslipidemia, hypertension, obesity and sedentary lifestyle. She is following a generally healthy diet. She participates in exercise intermittently. Her breakfast blood glucose range is generally 130-140 mg/dl. (120 blood sugar is the highest. Lunch 150-160 Dinner about 200 ) An ACE inhibitor/angiotensin II receptor blocker is contraindicated.  Hypertension This is a chronic problem. The current episode started more than 1 year ago. The problem has been gradually improving since onset. The problem is controlled. Pertinent negatives include no blurred vision, chest pain, palpitations or shortness of breath. Past  treatments include ACE inhibitors and diuretics. The current treatment provides moderate improvement. Compliance problems include exercise.  Hypertensive end-organ damage includes kidney disease.     Past Medical History:  Diagnosis Date  . Anemia in chronic kidney disease (CKD)   . Chronic kidney disease, stage V ([place])   . Hypertension   . Uncontrolled diabetes mellitus with microalbuminuria or microproteinuria      Family History  Problem Relation Age of Onset  . Hypertension Mother   . Diabetes Mother   . Kidney disease Mother   . Crohn's disease Mother   . Diabetes Father   . Hypertension Father   . Ulcers Father   . Kidney disease Father      Current Outpatient Medications:  .  amLODipine (NORVASC) 10 MG tablet, Take 1 tablet (10 mg total) by mouth daily., Disp: 30 tablet, Rfl: 0 .  atorvastatin (LIPITOR) 10 MG tablet, Take 10 mg by mouth daily at 6 PM. , Disp: , Rfl:  .  Continuous Blood Gluc Sensor (FREESTYLE LIBRE 14 DAY SENSOR) MISC, USE AS DIRECTED TO CHECK BLOOD SUGARS 4 TIMES PER DAY DX: E11.65, Disp: 4 each, Rfl: 2 .  fexofenadine (ALLEGRA) 180 MG tablet, Take 180 mg by mouth daily as needed for allergies or rhinitis., Disp: , Rfl:  .  ibuprofen (ADVIL,MOTRIN) 600 MG tablet, Take 1 tablet (600 mg total) by mouth every 6 (six) hours as needed., Disp: 60 tablet, Rfl: 3 .  insulin glargine (LANTUS SOLOSTAR) 100 UNIT/ML Solostar Pen, Inject 20 Units into the skin daily., Disp: 15 mL, Rfl: 11 .  insulin lispro (HUMALOG KWIKPEN) 100 UNIT/ML KwikPen, Per sliding scale through [facility] transplant team, Disp: 15 mL, Rfl: 11 .  OZEMPIC, 0.25 OR 0.5 MG/DOSE, 2 MG/1.5ML SOPN, Inject 0.375 mLs (0.5 mg total) into the skin once a week., Disp: 4.5 mL, Rfl: 1   No Known Allergies   Review of Systems  Constitutional: Negative.   Eyes: Negative for blurred vision.  Respiratory: Negative.  Negative for shortness of breath.   Cardiovascular: Negative.  Negative for chest pain,  palpitations and leg swelling.  Gastrointestinal: Negative.   Neurological: Negative.      Today's Vitals   06/25/20 1637  BP: 122/60  Pulse: 84  Temp: 98.4 F (36.9 C)  TempSrc: Oral  Weight: 183 lb 9.6 oz (83.3 kg)  Height: $Remove'5\' 8"'fUJVXfo$  (1.727 m)   Body mass index is 27.92 kg/m.  Wt Readings from Last 3 Encounters:  06/25/20 183 lb 9.6 oz (83.3 kg)  10/23/19 199 lb (90.3 kg)  10/15/19 198 lb 6.4 oz (90 kg)    Objective:  Physical Exam Vitals and nursing note reviewed.  Constitutional:      General: She is not in acute distress.    Appearance: Normal appearance.  Eyes:     Conjunctiva/sclera: Conjunctivae normal.     Pupils: Pupils are equal, round, and reactive to light.  Cardiovascular:     Rate and Rhythm: Normal rate and regular rhythm.     Pulses: Normal pulses.     Heart sounds: Normal heart sounds. No murmur heard.   Pulmonary:     Effort: Pulmonary effort is normal. No respiratory distress.     Breath sounds: Normal breath sounds. No wheezing.  Skin:    General: Skin is warm and dry.  Neurological:     General: No focal deficit present.     Mental Status: She is alert and oriented to person, place, and time.     Cranial Nerves: No cranial nerve deficit.     Motor: No weakness.  Psychiatric:        Mood and Affect: Mood normal.        Behavior: Behavior normal.        Thought Content: Thought content normal.        Judgment: Judgment normal.         Assessment And Plan:     1. Type 2 diabetes mellitus with stage 4 chronic kidney disease, with long-term current use of insulin (HCC)  Chronic, will check HgbA1c  She is having her diabetes managed by [facility] due to her having a kidney transplant - Hemoglobin A1c - POCT Urinalysis Dipstick (81002) - POCT UA - Microalbumin - insulin glargine (LANTUS SOLOSTAR) 100 UNIT/ML Solostar Pen; Inject 20 Units into the skin daily.  Dispense: 15 mL; Refill: 11 - insulin lispro (HUMALOG KWIKPEN) 100 UNIT/ML  KwikPen; Per sliding scale through [facility] transplant team  Dispense: 15 mL; Refill: 11 - CMP14+EGFR  2. Hypertensive nephropathy  Chronic, blood pressure is well controlled  3. Overweight with body mass index (BMI) of 27 to 27.9 in adult She is encouraged to strive for BMI less than 26 to decrease cardiac risk. Advised to aim for at least 150 minutes of exercise per week.  4. Renal transplant, status post Being followed at [facility]  Patient was given opportunity to ask questions. Patient verbalized understanding of the plan and was able to repeat key elements of the plan. All questions were answered to their satisfaction.  Samantha Brine, FNP   I, Samantha Brine, FNP, have reviewed all documentation for this visit. The documentation on 06/25/20 for the exam, diagnosis,  procedures, and orders are all accurate and complete.   THE PATIENT IS ENCOURAGED TO PRACTICE SOCIAL DISTANCING DUE TO THE COVID-19 PANDEMIC.

## 2020-06-25 NOTE — Patient Instructions (Signed)
Diabetes Mellitus and Exercise Exercising regularly is important for overall health, especially for people who have diabetes mellitus. Exercising is not only about losing weight. It has many other health benefits, such as increasing muscle strength and bone density and reducing body fat and stress. This leads to improved fitness, flexibility, and endurance, all of which result in better overall health. What are the benefits of exercise if I have diabetes? Exercise has many benefits for people with diabetes. They include:  Helping to lower and control blood sugar (glucose).  Helping the body to respond better to the hormone insulin by improving insulin sensitivity.  Reducing how much insulin the body needs.  Lowering the risk for heart disease by: ? Lowering "bad" cholesterol and triglyceride levels. ? Increasing "good" cholesterol levels. ? Lowering blood pressure. ? Lowering blood glucose levels. What is my activity plan? Your health care provider or certified diabetes educator can help you make a plan for the type and frequency of exercise that works for you. This is called your activity plan. Be sure to:  Get at least 150 minutes of medium-intensity or high-intensity exercise each week. Exercises may include brisk walking, biking, or water aerobics.  Do stretching and strengthening exercises, such as yoga or weight lifting, at least 2 times a week.  Spread out your activity over at least 3 days of the week.  Get some form of physical activity each day. ? Do not go more than 2 days in a row without some kind of physical activity. ? Avoid being inactive for more than 90 minutes at a time. Take frequent breaks to walk or stretch.  Choose exercises or activities that you enjoy. Set realistic goals.  Start slowly and gradually increase your exercise intensity over time.   How do I manage my diabetes during exercise? Monitor your blood glucose  Check your blood glucose before and  after exercising. If your blood glucose is: ? 240 mg/dL (13.3 mmol/L) or higher before you exercise, check your urine for ketones. These are chemicals created by the liver. If you have ketones in your urine, do not exercise until your blood glucose returns to normal. ? 100 mg/dL (5.6 mmol/L) or lower, eat a snack containing 15-20 grams of carbohydrate. Check your blood glucose 15 minutes after the snack to make sure that your glucose level is above 100 mg/dL (5.6 mmol/L) before you start your exercise.  Know the symptoms of low blood glucose (hypoglycemia) and how to treat it. Your risk for hypoglycemia increases during and after exercise. Follow these tips and your health care provider's instructions  Keep a carbohydrate snack that is fast-acting for use before, during, and after exercise to help prevent or treat hypoglycemia.  Avoid injecting insulin into areas of the body that are going to be exercised. For example, avoid injecting insulin into: ? Your arms, when you are about to play tennis. ? Your legs, when you are about to go jogging.  Keep records of your exercise habits. Doing this can help you and your health care provider adjust your diabetes management plan as needed. Write down: ? Food that you eat before and after you exercise. ? Blood glucose levels before and after you exercise. ? The type and amount of exercise you have done.  Work with your health care provider when you start a new exercise or activity. He or she may need to: ? Make sure that the activity is safe for you. ? Adjust your insulin, other medicines, and food that   you eat.  Drink plenty of water while you exercise. This prevents loss of water (dehydration) and problems caused by a lot of heat in the body (heat stroke).   Where to find more information  American Diabetes Association: www.diabetes.org Summary  Exercising regularly is important for overall health, especially for people who have diabetes  mellitus.  Exercising has many health benefits. It increases muscle strength and bone density and reduces body fat and stress. It also lowers and controls blood glucose.  Your health care provider or certified diabetes educator can help you make an activity plan for the type and frequency of exercise that works for you.  Work with your health care provider to make sure any new activity is safe for you. Also work with your health care provider to adjust your insulin, other medicines, and the food you eat. This information is not intended to replace advice given to you by your health care provider. Make sure you discuss any questions you have with your health care provider. Document Revised: 01/22/2019 Document Reviewed: 01/22/2019 Elsevier Patient Education  2021 Elsevier Inc.  

## 2020-06-26 DIAGNOSIS — I151 Hypertension secondary to other renal disorders: Secondary | ICD-10-CM | POA: Diagnosis not present

## 2020-06-26 DIAGNOSIS — R911 Solitary pulmonary nodule: Secondary | ICD-10-CM | POA: Diagnosis not present

## 2020-06-26 DIAGNOSIS — E785 Hyperlipidemia, unspecified: Secondary | ICD-10-CM | POA: Diagnosis not present

## 2020-06-26 DIAGNOSIS — E119 Type 2 diabetes mellitus without complications: Secondary | ICD-10-CM | POA: Diagnosis not present

## 2020-06-26 DIAGNOSIS — Z79899 Other long term (current) drug therapy: Secondary | ICD-10-CM | POA: Diagnosis not present

## 2020-06-26 DIAGNOSIS — Z4822 Encounter for aftercare following kidney transplant: Secondary | ICD-10-CM | POA: Diagnosis not present

## 2020-06-26 DIAGNOSIS — Z792 Long term (current) use of antibiotics: Secondary | ICD-10-CM | POA: Diagnosis not present

## 2020-06-26 DIAGNOSIS — Z794 Long term (current) use of insulin: Secondary | ICD-10-CM | POA: Diagnosis not present

## 2020-06-26 DIAGNOSIS — E1122 Type 2 diabetes mellitus with diabetic chronic kidney disease: Secondary | ICD-10-CM | POA: Diagnosis not present

## 2020-06-26 DIAGNOSIS — I10 Essential (primary) hypertension: Secondary | ICD-10-CM | POA: Diagnosis not present

## 2020-06-26 DIAGNOSIS — Z7952 Long term (current) use of systemic steroids: Secondary | ICD-10-CM | POA: Diagnosis not present

## 2020-06-26 DIAGNOSIS — Z940 Kidney transplant status: Secondary | ICD-10-CM | POA: Diagnosis not present

## 2020-06-26 DIAGNOSIS — D649 Anemia, unspecified: Secondary | ICD-10-CM | POA: Diagnosis not present

## 2020-06-26 LAB — CMP14+EGFR
ALT: 7 IU/L (ref 0–32)
AST: 10 IU/L (ref 0–40)
Albumin/Globulin Ratio: 1.5 (ref 1.2–2.2)
Albumin: 4.1 g/dL (ref 3.8–4.9)
Alkaline Phosphatase: 128 IU/L — ABNORMAL HIGH (ref 44–121)
BUN/Creatinine Ratio: 20 (ref 9–23)
BUN: 35 mg/dL — ABNORMAL HIGH (ref 6–24)
Bilirubin Total: <0.2 mg/dL (ref 0.0–1.2)
CO2: 21 mmol/L (ref 20–29)
Calcium: 10.4 mg/dL — ABNORMAL HIGH (ref 8.7–10.2)
Chloride: 106 mmol/L (ref 96–106)
Creatinine, Ser: 1.73 mg/dL — ABNORMAL HIGH (ref 0.57–1.00)
GFR calc Af Amer: 38 mL/min/{1.73_m2} — ABNORMAL LOW (ref >59)
GFR calc non Af Amer: 33 mL/min/{1.73_m2} — ABNORMAL LOW (ref >59)
Globulin, Total: 2.7 g/dL (ref 1.5–4.5)
Glucose: 166 mg/dL — ABNORMAL HIGH (ref 65–99)
Potassium: 4.3 mmol/L (ref 3.5–5.2)
Sodium: 140 mmol/L (ref 134–144)
Total Protein: 6.8 g/dL (ref 6.0–8.5)

## 2020-06-26 LAB — HEMOGLOBIN A1C
Est. average glucose Bld gHb Est-mCnc: 203 mg/dL
Hgb A1c MFr Bld: 8.7 % — ABNORMAL HIGH (ref 4.8–5.6)

## 2020-07-01 ENCOUNTER — Encounter: Payer: Self-pay | Admitting: Internal Medicine

## 2020-07-10 DIAGNOSIS — R911 Solitary pulmonary nodule: Secondary | ICD-10-CM | POA: Diagnosis not present

## 2020-07-10 DIAGNOSIS — Z4822 Encounter for aftercare following kidney transplant: Secondary | ICD-10-CM | POA: Diagnosis not present

## 2020-07-10 DIAGNOSIS — E785 Hyperlipidemia, unspecified: Secondary | ICD-10-CM | POA: Diagnosis not present

## 2020-07-10 DIAGNOSIS — Z792 Long term (current) use of antibiotics: Secondary | ICD-10-CM | POA: Diagnosis not present

## 2020-07-10 DIAGNOSIS — Z79899 Other long term (current) drug therapy: Secondary | ICD-10-CM | POA: Diagnosis not present

## 2020-07-10 DIAGNOSIS — Z940 Kidney transplant status: Secondary | ICD-10-CM | POA: Diagnosis not present

## 2020-07-10 DIAGNOSIS — E119 Type 2 diabetes mellitus without complications: Secondary | ICD-10-CM | POA: Diagnosis not present

## 2020-07-10 DIAGNOSIS — D649 Anemia, unspecified: Secondary | ICD-10-CM | POA: Diagnosis not present

## 2020-07-10 DIAGNOSIS — Z7952 Long term (current) use of systemic steroids: Secondary | ICD-10-CM | POA: Diagnosis not present

## 2020-07-10 DIAGNOSIS — E1122 Type 2 diabetes mellitus with diabetic chronic kidney disease: Secondary | ICD-10-CM | POA: Diagnosis not present

## 2020-07-10 DIAGNOSIS — I10 Essential (primary) hypertension: Secondary | ICD-10-CM | POA: Diagnosis not present

## 2020-07-10 DIAGNOSIS — B9789 Other viral agents as the cause of diseases classified elsewhere: Secondary | ICD-10-CM | POA: Diagnosis not present

## 2020-07-10 DIAGNOSIS — Z794 Long term (current) use of insulin: Secondary | ICD-10-CM | POA: Diagnosis not present

## 2020-07-21 ENCOUNTER — Encounter: Payer: BC Managed Care – PPO | Admitting: Internal Medicine

## 2020-07-24 DIAGNOSIS — Z940 Kidney transplant status: Secondary | ICD-10-CM | POA: Diagnosis not present

## 2020-07-24 DIAGNOSIS — D849 Immunodeficiency, unspecified: Secondary | ICD-10-CM | POA: Diagnosis not present

## 2020-08-07 DIAGNOSIS — E1165 Type 2 diabetes mellitus with hyperglycemia: Secondary | ICD-10-CM | POA: Diagnosis not present

## 2020-08-07 DIAGNOSIS — R911 Solitary pulmonary nodule: Secondary | ICD-10-CM | POA: Diagnosis not present

## 2020-08-07 DIAGNOSIS — I10 Essential (primary) hypertension: Secondary | ICD-10-CM | POA: Diagnosis not present

## 2020-08-07 DIAGNOSIS — E785 Hyperlipidemia, unspecified: Secondary | ICD-10-CM | POA: Diagnosis not present

## 2020-08-07 DIAGNOSIS — Z794 Long term (current) use of insulin: Secondary | ICD-10-CM | POA: Diagnosis not present

## 2020-08-07 DIAGNOSIS — Z4822 Encounter for aftercare following kidney transplant: Secondary | ICD-10-CM | POA: Diagnosis not present

## 2020-08-07 DIAGNOSIS — B349 Viral infection, unspecified: Secondary | ICD-10-CM | POA: Diagnosis not present

## 2020-08-07 DIAGNOSIS — Z79899 Other long term (current) drug therapy: Secondary | ICD-10-CM | POA: Diagnosis not present

## 2020-08-07 DIAGNOSIS — Z7952 Long term (current) use of systemic steroids: Secondary | ICD-10-CM | POA: Diagnosis not present

## 2020-08-07 DIAGNOSIS — Z792 Long term (current) use of antibiotics: Secondary | ICD-10-CM | POA: Diagnosis not present

## 2020-08-21 DIAGNOSIS — D849 Immunodeficiency, unspecified: Secondary | ICD-10-CM | POA: Diagnosis not present

## 2020-08-21 DIAGNOSIS — Z940 Kidney transplant status: Secondary | ICD-10-CM | POA: Diagnosis not present

## 2020-08-21 LAB — CBC: RBC: 3.86 — AB (ref 3.87–5.11)

## 2020-08-21 LAB — CBC AND DIFFERENTIAL
HCT: 35 — AB (ref 36–46)
Hemoglobin: 11.1 — AB (ref 12.0–16.0)
Platelets: 233 (ref 150–399)
WBC: 4.3

## 2020-09-04 DIAGNOSIS — Z7952 Long term (current) use of systemic steroids: Secondary | ICD-10-CM | POA: Diagnosis not present

## 2020-09-04 DIAGNOSIS — Z940 Kidney transplant status: Secondary | ICD-10-CM | POA: Diagnosis not present

## 2020-09-04 DIAGNOSIS — E119 Type 2 diabetes mellitus without complications: Secondary | ICD-10-CM | POA: Diagnosis not present

## 2020-09-04 DIAGNOSIS — R911 Solitary pulmonary nodule: Secondary | ICD-10-CM | POA: Diagnosis not present

## 2020-09-04 DIAGNOSIS — D649 Anemia, unspecified: Secondary | ICD-10-CM | POA: Diagnosis not present

## 2020-09-04 DIAGNOSIS — Z4822 Encounter for aftercare following kidney transplant: Secondary | ICD-10-CM | POA: Diagnosis not present

## 2020-09-04 DIAGNOSIS — E785 Hyperlipidemia, unspecified: Secondary | ICD-10-CM | POA: Diagnosis not present

## 2020-09-04 DIAGNOSIS — I10 Essential (primary) hypertension: Secondary | ICD-10-CM | POA: Diagnosis not present

## 2020-09-04 DIAGNOSIS — E1122 Type 2 diabetes mellitus with diabetic chronic kidney disease: Secondary | ICD-10-CM | POA: Diagnosis not present

## 2020-09-04 DIAGNOSIS — Z792 Long term (current) use of antibiotics: Secondary | ICD-10-CM | POA: Diagnosis not present

## 2020-09-04 DIAGNOSIS — Z79899 Other long term (current) drug therapy: Secondary | ICD-10-CM | POA: Diagnosis not present

## 2020-09-04 DIAGNOSIS — Z5181 Encounter for therapeutic drug level monitoring: Secondary | ICD-10-CM | POA: Diagnosis not present

## 2020-09-04 DIAGNOSIS — D849 Immunodeficiency, unspecified: Secondary | ICD-10-CM | POA: Diagnosis not present

## 2020-10-02 DIAGNOSIS — R911 Solitary pulmonary nodule: Secondary | ICD-10-CM | POA: Diagnosis not present

## 2020-10-02 DIAGNOSIS — Z7952 Long term (current) use of systemic steroids: Secondary | ICD-10-CM | POA: Diagnosis not present

## 2020-10-02 DIAGNOSIS — E1122 Type 2 diabetes mellitus with diabetic chronic kidney disease: Secondary | ICD-10-CM | POA: Diagnosis not present

## 2020-10-02 DIAGNOSIS — D849 Immunodeficiency, unspecified: Secondary | ICD-10-CM | POA: Diagnosis not present

## 2020-10-02 DIAGNOSIS — I10 Essential (primary) hypertension: Secondary | ICD-10-CM | POA: Diagnosis not present

## 2020-10-02 DIAGNOSIS — E785 Hyperlipidemia, unspecified: Secondary | ICD-10-CM | POA: Diagnosis not present

## 2020-10-02 DIAGNOSIS — D649 Anemia, unspecified: Secondary | ICD-10-CM | POA: Diagnosis not present

## 2020-10-02 DIAGNOSIS — Z79899 Other long term (current) drug therapy: Secondary | ICD-10-CM | POA: Diagnosis not present

## 2020-10-02 DIAGNOSIS — Z4822 Encounter for aftercare following kidney transplant: Secondary | ICD-10-CM | POA: Diagnosis not present

## 2020-10-02 DIAGNOSIS — Z940 Kidney transplant status: Secondary | ICD-10-CM | POA: Diagnosis not present

## 2020-10-02 DIAGNOSIS — Z792 Long term (current) use of antibiotics: Secondary | ICD-10-CM | POA: Diagnosis not present

## 2020-10-13 DIAGNOSIS — I2510 Atherosclerotic heart disease of native coronary artery without angina pectoris: Secondary | ICD-10-CM | POA: Diagnosis not present

## 2020-10-13 DIAGNOSIS — R911 Solitary pulmonary nodule: Secondary | ICD-10-CM | POA: Diagnosis not present

## 2020-10-13 DIAGNOSIS — I358 Other nonrheumatic aortic valve disorders: Secondary | ICD-10-CM | POA: Diagnosis not present

## 2020-10-13 DIAGNOSIS — E890 Postprocedural hypothyroidism: Secondary | ICD-10-CM | POA: Diagnosis not present

## 2020-10-16 DIAGNOSIS — R911 Solitary pulmonary nodule: Secondary | ICD-10-CM | POA: Diagnosis not present

## 2020-10-16 DIAGNOSIS — Z940 Kidney transplant status: Secondary | ICD-10-CM | POA: Diagnosis not present

## 2020-11-06 ENCOUNTER — Encounter: Payer: BC Managed Care – PPO | Admitting: Internal Medicine

## 2020-11-12 LAB — BASIC METABOLIC PANEL
BUN: 28 — AB (ref 4–21)
BUN: 35 — AB (ref 4–21)
CO2: 23 — AB (ref 13–22)
CO2: 28 — AB (ref 13–22)
Chloride: 107 (ref 99–108)
Chloride: 111 — AB (ref 99–108)
Creatinine: 1.8 — AB (ref 0.5–1.1)
Creatinine: 1.9 — AB (ref 0.5–1.1)
Glucose: 135
Glucose: 173
Potassium: 4.2 (ref 3.4–5.3)
Potassium: 4.6 (ref 3.4–5.3)
Sodium: 141 (ref 137–147)
Sodium: 143 (ref 137–147)

## 2020-11-12 LAB — CBC AND DIFFERENTIAL
HCT: 33 — AB (ref 36–46)
Hemoglobin: 10.4 — AB (ref 12.0–16.0)
Platelets: 256 (ref 150–399)
WBC: 5.8

## 2020-11-12 LAB — HEPATIC FUNCTION PANEL
ALT: 7 (ref 7–35)
ALT: 9 (ref 7–35)
AST: 10 — AB (ref 13–35)
AST: 8 — AB (ref 13–35)
Alkaline Phosphatase: 131 — AB (ref 25–125)
Alkaline Phosphatase: 148 — AB (ref 25–125)
Bilirubin, Total: 0.4
Bilirubin, Total: 0.4

## 2020-11-12 LAB — CBC
RBC: 3.74 — AB (ref 3.87–5.11)
RBC: 3.74 — AB (ref 3.87–5.11)

## 2020-11-12 LAB — COMPREHENSIVE METABOLIC PANEL
Albumin: 3.8 (ref 3.5–5.0)
Albumin: 3.9 (ref 3.5–5.0)
Calcium: 9.8 (ref 8.7–10.7)
GFR calc Af Amer: 34

## 2020-11-18 ENCOUNTER — Other Ambulatory Visit: Payer: Self-pay

## 2020-11-18 ENCOUNTER — Ambulatory Visit (INDEPENDENT_AMBULATORY_CARE_PROVIDER_SITE_OTHER): Payer: Medicaid Other | Admitting: Internal Medicine

## 2020-11-18 ENCOUNTER — Encounter: Payer: Self-pay | Admitting: Internal Medicine

## 2020-11-18 VITALS — BP 120/66 | HR 79 | Temp 97.7°F | Ht 67.0 in | Wt 190.6 lb

## 2020-11-18 DIAGNOSIS — Z794 Long term (current) use of insulin: Secondary | ICD-10-CM

## 2020-11-18 DIAGNOSIS — Z0000 Encounter for general adult medical examination without abnormal findings: Secondary | ICD-10-CM

## 2020-11-18 DIAGNOSIS — I129 Hypertensive chronic kidney disease with stage 1 through stage 4 chronic kidney disease, or unspecified chronic kidney disease: Secondary | ICD-10-CM | POA: Diagnosis not present

## 2020-11-18 DIAGNOSIS — Z23 Encounter for immunization: Secondary | ICD-10-CM | POA: Diagnosis not present

## 2020-11-18 DIAGNOSIS — Z940 Kidney transplant status: Secondary | ICD-10-CM | POA: Diagnosis not present

## 2020-11-18 DIAGNOSIS — E1122 Type 2 diabetes mellitus with diabetic chronic kidney disease: Secondary | ICD-10-CM | POA: Diagnosis not present

## 2020-11-18 DIAGNOSIS — Z6831 Body mass index (BMI) 31.0-31.9, adult: Secondary | ICD-10-CM

## 2020-11-18 DIAGNOSIS — N184 Chronic kidney disease, stage 4 (severe): Secondary | ICD-10-CM

## 2020-11-18 DIAGNOSIS — E6609 Other obesity due to excess calories: Secondary | ICD-10-CM | POA: Diagnosis not present

## 2020-11-18 LAB — POCT URINALYSIS DIPSTICK
Bilirubin, UA: NEGATIVE
Blood, UA: NEGATIVE
Glucose, UA: POSITIVE — AB
Ketones, UA: NEGATIVE
Nitrite, UA: NEGATIVE
Protein, UA: POSITIVE — AB
Spec Grav, UA: 1.015 (ref 1.010–1.025)
Urobilinogen, UA: 0.2 E.U./dL
pH, UA: 5.5 (ref 5.0–8.0)

## 2020-11-18 LAB — POCT UA - MICROALBUMIN
Albumin/Creatinine Ratio, Urine, POC: 300
Creatinine, POC: 300 mg/dL
Microalbumin Ur, POC: 80 mg/L

## 2020-11-18 NOTE — Progress Notes (Signed)
I,Katawbba Wiggins,acting as a Education administrator for Maximino Greenland, MD.,have documented all relevant documentation on the behalf of Maximino Greenland, MD,as directed by  Maximino Greenland, MD while in the presence of Maximino Greenland, MD.  This visit occurred during the SARS-CoV-2 public health emergency.  Safety protocols were in place, including screening questions prior to the visit, additional usage of staff PPE, and extensive cleaning of exam room while observing appropriate contact time as indicated for disinfecting solutions.  Subjective:     Patient ID: Samantha [name] , female    DOB: 10/24/1965 , 55 y.o.   MRN: [id number]   Chief Complaint  Patient presents with   Annual Exam   Diabetes   Hypertension    HPI  She is here tdoay for a full physical exam. She is followed by Dr. Jennelle Human for her GYN exams. Last pap was October 2020.  Hypertension This is a chronic problem. The current episode started more than 1 year ago. The problem has been gradually improving since onset. The problem is controlled. Pertinent negatives include no blurred vision, chest pain, palpitations or shortness of breath. Past treatments include ACE inhibitors and diuretics. The current treatment provides moderate improvement. Compliance problems include exercise.  Hypertensive end-organ damage includes kidney disease.  Diabetes She presents for her follow-up diabetic visit. She has type 2 diabetes mellitus. Her disease course has been stable. There are no hypoglycemic associated symptoms. Pertinent negatives for diabetes include no blurred vision and no chest pain. There are no hypoglycemic complications. Symptoms are improving. Diabetic complications include nephropathy. Risk factors for coronary artery disease include diabetes mellitus, dyslipidemia, hypertension, obesity and sedentary lifestyle. She is following a generally healthy diet. She participates in exercise intermittently. Her breakfast blood glucose range is  generally 130-140 mg/dl. An ACE inhibitor/angiotensin II receptor blocker is contraindicated.    Past Medical History:  Diagnosis Date   Anemia in chronic kidney disease (CKD)    Chronic kidney disease, stage V (HCC)    Hypertension    Uncontrolled diabetes mellitus with microalbuminuria or microproteinuria      Family History  Problem Relation Age of Onset   Hypertension Mother    Diabetes Mother    Kidney disease Mother    Crohn's disease Mother    Diabetes Father    Hypertension Father    Ulcers Father    Kidney disease Father      Current Outpatient Medications:    atorvastatin (LIPITOR) 80 MG tablet, Take 80 mg by mouth daily., Disp: , Rfl:    Continuous Blood Gluc Sensor (FREESTYLE LIBRE 14 DAY SENSOR) MISC, USE AS DIRECTED TO CHECK BLOOD SUGARS 4 TIMES PER DAY DX: E11.65, Disp: 4 each, Rfl: 2   insulin glargine (LANTUS SOLOSTAR) 100 UNIT/ML Solostar Pen, Inject 20 Units into the skin daily. (Patient taking differently: Inject 30 Units into the skin daily.), Disp: 15 mL, Rfl: 11   insulin lispro (HUMALOG KWIKPEN) 100 UNIT/ML KwikPen, Per sliding scale through [facility] transplant team, Disp: 15 mL, Rfl: 11   mycophenolate (MYFORTIC) 180 MG EC tablet, Take by mouth. 4 tabs 2 times per day, Disp: , Rfl:    NIFEdipine (PROCARDIA XL/NIFEDICAL XL) 60 MG 24 hr tablet, Take 1 tablet by mouth daily., Disp: , Rfl:    predniSONE (DELTASONE) 5 MG tablet, Take 5 mg by mouth daily., Disp: , Rfl:    sulfamethoxazole-trimethoprim (BACTRIM) 400-80 MG tablet, Take 1 tablet by mouth 3 (three) times a week., Disp: , Rfl:  tacrolimus (PROGRAF) 1 MG capsule, Take by mouth. 4 tabs in the am, 3 tabs in the pm, Disp: , Rfl:    fexofenadine (ALLEGRA) 180 MG tablet, Take 180 mg by mouth daily as needed for allergies or rhinitis. (Patient not taking: Reported on 11/18/2020), Disp: , Rfl:    ibuprofen (ADVIL,MOTRIN) 600 MG tablet, Take 1 tablet (600 mg total) by mouth every 6 (six) hours as needed.  (Patient not taking: Reported on 11/18/2020), Disp: 60 tablet, Rfl: 3   OZEMPIC, 0.25 OR 0.5 MG/DOSE, 2 MG/1.5ML SOPN, Inject 0.5 mg into the skin once a week., Disp: 4.5 mL, Rfl: 1   No Known Allergies    The patient states she uses none for birth control. Last LMP was No LMP recorded. Patient has had an ablation.. Negative for Dysmenorrhea. Negative for: breast discharge, breast lump(s), breast pain and breast self exam. Associated symptoms include abnormal vaginal bleeding. Pertinent negatives include abnormal bleeding (hematology), anxiety, decreased libido, depression, difficulty falling sleep, dyspareunia, history of infertility, nocturia, sexual dysfunction, sleep disturbances, urinary incontinence, urinary urgency, vaginal discharge and vaginal itching. Diet regular.The patient states her exercise level is  intermittent.  . The patient's tobacco use is:  Social History   Tobacco Use  Smoking Status Never  Smokeless Tobacco Never  . She has been exposed to passive smoke. The patient's alcohol use is:  Social History   Substance and Sexual Activity  Alcohol Use No    Review of Systems  Constitutional: Negative.   HENT: Negative.    Eyes: Negative.  Negative for blurred vision.  Respiratory: Negative.  Negative for shortness of breath.   Cardiovascular: Negative.  Negative for chest pain and palpitations.  Gastrointestinal: Negative.   Endocrine: Negative.   Genitourinary: Negative.   Musculoskeletal: Negative.   Skin: Negative.   Allergic/Immunologic: Negative.   Neurological: Negative.   Hematological: Negative.   Psychiatric/Behavioral: Negative.      Today's Vitals   11/18/20 1152  BP: 120/66  Pulse: 79  Temp: 97.7 F (36.5 C)  TempSrc: Oral  Weight: 190 lb 9.6 oz (86.5 kg)  Height: 5\' 7"  (1.702 m)   Body mass index is 29.85 kg/m.  Wt Readings from Last 3 Encounters:  11/18/20 190 lb 9.6 oz (86.5 kg)  06/25/20 183 lb 9.6 oz (83.3 kg)  10/23/19 199 lb (90.3  kg)    BP Readings from Last 3 Encounters:  11/18/20 120/66  06/25/20 122/60  11/06/19 (!) 149/58    Objective:  Physical Exam Vitals and nursing note reviewed.  Constitutional:      Appearance: Normal appearance.  HENT:     Head: Normocephalic and atraumatic.     Right Ear: Tympanic membrane, ear canal and external ear normal.     Left Ear: Tympanic membrane, ear canal and external ear normal.     Nose:     Comments: Masked     Mouth/Throat:     Comments: Masked  Eyes:     Extraocular Movements: Extraocular movements intact.     Conjunctiva/sclera: Conjunctivae normal.     Pupils: Pupils are equal, round, and reactive to light.  Cardiovascular:     Rate and Rhythm: Normal rate and regular rhythm.     Pulses: Normal pulses.          Dorsalis pedis pulses are 2+ on the right side and 2+ on the left side.     Heart sounds: Normal heart sounds.  Pulmonary:     Effort: Pulmonary effort is normal.  Breath sounds: Normal breath sounds.  Chest:  Breasts:    Tanner Score is 5.     Right: Normal.     Left: Normal.  Abdominal:     General: Bowel sounds are normal.     Palpations: Abdomen is soft.     Comments: Well healed surgical scar  Genitourinary:    Comments: deferred Musculoskeletal:        General: Normal range of motion.     Cervical back: Normal range of motion and neck supple.  Feet:     Right foot:     Protective Sensation: 5 sites tested.  5 sites sensed.     Skin integrity: Skin integrity normal.     Toenail Condition: Right toenails are normal.     Left foot:     Protective Sensation: 5 sites tested.  5 sites sensed.     Skin integrity: Skin integrity normal.     Toenail Condition: Left toenails are normal.  Skin:    General: Skin is warm and dry.  Neurological:     General: No focal deficit present.     Mental Status: She is alert and oriented to person, place, and time.  Psychiatric:        Mood and Affect: Mood normal.        Behavior: Behavior  normal.        Assessment And Plan:     1. Routine general medical examination at health care facility Comments: A full exam was performed. Importance of monthly self breast exams was discussed with patient.  - Lipid panel - TSH  2. Type 2 diabetes mellitus with stage 4 chronic kidney disease, with long-term current use of insulin (HCC) Comments: Diabetic foot exam was performed. Unfortunately, she has not taken Ozempic inawhile. She is encouraged to resume 0.25mg  x 2 weeks, then increase to 0.5mg .  - POCT Urinalysis Dipstick (81002) - POCT UA - Microalbumin  3. Hypertensive nephropathy Comments: Chronic, well controlled. EKg performed, NSR w/ Voltage criteria for LVH, old inferior infarct and nonspecific T-abnormality. No med changes.  - EKG 12-Lead  4. Class 1 obesity due to excess calories with serious comorbidity and body mass index (BMI) of 31.0 to 31.9 in adult Comments: She is encouraged to strive for BMi less than 28 to decrease cardiac risk. Advised to aim for at least 150 minutes of exercise per week.   5. Immunization due Comments: I will send rx Shingrix to the local pharmacy. She will speak to Transplant team regarding timing of vaccination.   6. Renal transplant, status post She is encouraged to strive for BMI less than 30 to decrease cardiac risk. Advised to aim for at least 150 minutes of exercise per week.    Patient was given opportunity to ask questions. Patient verbalized understanding of the plan and was able to repeat key elements of the plan. All questions were answered to their satisfaction.   Maximino Greenland, MD   I, Maximino Greenland, MD, have reviewed all documentation for this visit. The documentation on 11/29/20 for the exam, diagnosis, procedures, and orders are all accurate and complete.  THE PATIENT IS ENCOURAGED TO PRACTICE SOCIAL DISTANCING DUE TO THE COVID-19 PANDEMIC.

## 2020-11-18 NOTE — Patient Instructions (Signed)
Health Maintenance, Female Adopting a healthy lifestyle and getting preventive care are important in promoting health and wellness. Ask your health care provider about: The right schedule for you to have regular tests and exams. Things you can do on your own to prevent diseases and keep yourself healthy. What should I know about diet, weight, and exercise? Eat a healthy diet  Eat a diet that includes plenty of vegetables, fruits, low-fat dairy products, and lean protein. Do not eat a lot of foods that are high in solid fats, added sugars, or sodium.  Maintain a healthy weight Body mass index (BMI) is used to identify weight problems. It estimates body fat based on height and weight. Your health care provider can help determineyour BMI and help you achieve or maintain a healthy weight. Get regular exercise Get regular exercise. This is one of the most important things you can do for your health. Most adults should: Exercise for at least 150 minutes each week. The exercise should increase your heart rate and make you sweat (moderate-intensity exercise). Do strengthening exercises at least twice a week. This is in addition to the moderate-intensity exercise. Spend less time sitting. Even light physical activity can be beneficial. Watch cholesterol and blood lipids Have your blood tested for lipids and cholesterol at 54 years of age, then havethis test every 5 years. Have your cholesterol levels checked more often if: Your lipid or cholesterol levels are high. You are older than 54 years of age. You are at high risk for heart disease. What should I know about cancer screening? Depending on your health history and family history, you may need to have cancer screening at various ages. This may include screening for: Breast cancer. Cervical cancer. Colorectal cancer. Skin cancer. Lung cancer. What should I know about heart disease, diabetes, and high blood pressure? Blood pressure and heart  disease High blood pressure causes heart disease and increases the risk of stroke. This is more likely to develop in people who have high blood pressure readings, are of African descent, or are overweight. Have your blood pressure checked: Every 3-5 years if you are 18-39 years of age. Every year if you are 40 years old or older. Diabetes Have regular diabetes screenings. This checks your fasting blood sugar level. Have the screening done: Once every three years after age 40 if you are at a normal weight and have a low risk for diabetes. More often and at a younger age if you are overweight or have a high risk for diabetes. What should I know about preventing infection? Hepatitis B If you have a higher risk for hepatitis B, you should be screened for this virus. Talk with your health care provider to find out if you are at risk forhepatitis B infection. Hepatitis C Testing is recommended for: Everyone born from 1945 through 1965. Anyone with known risk factors for hepatitis C. Sexually transmitted infections (STIs) Get screened for STIs, including gonorrhea and chlamydia, if: You are sexually active and are younger than 54 years of age. You are older than 54 years of age and your health care provider tells you that you are at risk for this type of infection. Your sexual activity has changed since you were last screened, and you are at increased risk for chlamydia or gonorrhea. Ask your health care provider if you are at risk. Ask your health care provider about whether you are at high risk for HIV. Your health care provider may recommend a prescription medicine to help   prevent HIV infection. If you choose to take medicine to prevent HIV, you should first get tested for HIV. You should then be tested every 3 months for as long as you are taking the medicine. Pregnancy If you are about to stop having your period (premenopausal) and you may become pregnant, seek counseling before you get  pregnant. Take 400 to 800 micrograms (mcg) of folic acid every day if you become pregnant. Ask for birth control (contraception) if you want to prevent pregnancy. Osteoporosis and menopause Osteoporosis is a disease in which the bones lose minerals and strength with aging. This can result in bone fractures. If you are 65 years old or older, or if you are at risk for osteoporosis and fractures, ask your health care provider if you should: Be screened for bone loss. Take a calcium or vitamin D supplement to lower your risk of fractures. Be given hormone replacement therapy (HRT) to treat symptoms of menopause. Follow these instructions at home: Lifestyle Do not use any products that contain nicotine or tobacco, such as cigarettes, e-cigarettes, and chewing tobacco. If you need help quitting, ask your health care provider. Do not use street drugs. Do not share needles. Ask your health care provider for help if you need support or information about quitting drugs. Alcohol use Do not drink alcohol if: Your health care provider tells you not to drink. You are pregnant, may be pregnant, or are planning to become pregnant. If you drink alcohol: Limit how much you use to 0-1 drink a day. Limit intake if you are breastfeeding. Be aware of how much alcohol is in your drink. In the [place] one drink equals one 12 oz bottle of beer (355 mL), one 5 oz glass of wine (148 mL), or one 1 oz glass of hard liquor (44 mL). General instructions Schedule regular health, dental, and eye exams. Stay current with your vaccines. Tell your health care provider if: You often feel depressed. You have ever been abused or do not feel safe at home. Summary Adopting a healthy lifestyle and getting preventive care are important in promoting health and wellness. Follow your health care provider's instructions about healthy diet, exercising, and getting tested or screened for diseases. Follow your health care provider's  instructions on monitoring your cholesterol and blood pressure. This information is not intended to replace advice given to you by your health care provider. Make sure you discuss any questions you have with your healthcare provider. Document Revised: 04/19/2018 Document Reviewed: 04/19/2018 Elsevier Patient Education  2022 Elsevier Inc.  

## 2020-11-19 LAB — LIPID PANEL
Chol/HDL Ratio: 2.5 ratio (ref 0.0–4.4)
Cholesterol, Total: 131 mg/dL (ref 100–199)
HDL: 53 mg/dL (ref >39)
LDL Chol Calc (NIH): 63 mg/dL (ref 0–99)
Triglycerides: 75 mg/dL (ref 0–149)
VLDL Cholesterol Cal: 15 mg/dL (ref 5–40)

## 2020-11-19 LAB — TSH: TSH: 2.07 u[IU]/mL (ref 0.450–4.500)

## 2020-11-21 ENCOUNTER — Other Ambulatory Visit: Payer: Self-pay

## 2020-11-21 DIAGNOSIS — N184 Chronic kidney disease, stage 4 (severe): Secondary | ICD-10-CM

## 2020-11-21 DIAGNOSIS — E1122 Type 2 diabetes mellitus with diabetic chronic kidney disease: Secondary | ICD-10-CM

## 2020-11-21 DIAGNOSIS — Z794 Long term (current) use of insulin: Secondary | ICD-10-CM

## 2020-11-21 MED ORDER — OZEMPIC (0.25 OR 0.5 MG/DOSE) 2 MG/1.5ML ~~LOC~~ SOPN: 0.5000 mg | PEN_INJECTOR | SUBCUTANEOUS | 1 refills | Status: DC

## 2020-11-27 ENCOUNTER — Encounter: Payer: Self-pay | Admitting: Internal Medicine

## 2020-12-01 DIAGNOSIS — E785 Hyperlipidemia, unspecified: Secondary | ICD-10-CM | POA: Diagnosis not present

## 2020-12-01 DIAGNOSIS — Z794 Long term (current) use of insulin: Secondary | ICD-10-CM | POA: Diagnosis not present

## 2020-12-01 DIAGNOSIS — D649 Anemia, unspecified: Secondary | ICD-10-CM | POA: Diagnosis not present

## 2020-12-01 DIAGNOSIS — I151 Hypertension secondary to other renal disorders: Secondary | ICD-10-CM | POA: Diagnosis not present

## 2020-12-01 DIAGNOSIS — I2510 Atherosclerotic heart disease of native coronary artery without angina pectoris: Secondary | ICD-10-CM | POA: Diagnosis not present

## 2020-12-01 DIAGNOSIS — I10 Essential (primary) hypertension: Secondary | ICD-10-CM | POA: Diagnosis not present

## 2020-12-01 DIAGNOSIS — Z5181 Encounter for therapeutic drug level monitoring: Secondary | ICD-10-CM | POA: Diagnosis not present

## 2020-12-01 DIAGNOSIS — Z79899 Other long term (current) drug therapy: Secondary | ICD-10-CM | POA: Diagnosis not present

## 2020-12-01 DIAGNOSIS — Z940 Kidney transplant status: Secondary | ICD-10-CM | POA: Diagnosis not present

## 2020-12-01 DIAGNOSIS — R911 Solitary pulmonary nodule: Secondary | ICD-10-CM | POA: Diagnosis not present

## 2020-12-01 DIAGNOSIS — N2889 Other specified disorders of kidney and ureter: Secondary | ICD-10-CM | POA: Diagnosis not present

## 2020-12-01 DIAGNOSIS — Z4822 Encounter for aftercare following kidney transplant: Secondary | ICD-10-CM | POA: Diagnosis not present

## 2020-12-01 DIAGNOSIS — E1165 Type 2 diabetes mellitus with hyperglycemia: Secondary | ICD-10-CM | POA: Diagnosis not present

## 2020-12-01 DIAGNOSIS — D849 Immunodeficiency, unspecified: Secondary | ICD-10-CM | POA: Diagnosis not present

## 2020-12-01 DIAGNOSIS — E119 Type 2 diabetes mellitus without complications: Secondary | ICD-10-CM | POA: Diagnosis not present

## 2020-12-09 DIAGNOSIS — E1165 Type 2 diabetes mellitus with hyperglycemia: Secondary | ICD-10-CM | POA: Diagnosis not present

## 2020-12-09 DIAGNOSIS — Z794 Long term (current) use of insulin: Secondary | ICD-10-CM | POA: Diagnosis not present

## 2020-12-09 DIAGNOSIS — Z7982 Long term (current) use of aspirin: Secondary | ICD-10-CM | POA: Diagnosis not present

## 2020-12-09 DIAGNOSIS — Z940 Kidney transplant status: Secondary | ICD-10-CM | POA: Diagnosis not present

## 2020-12-09 DIAGNOSIS — I10 Essential (primary) hypertension: Secondary | ICD-10-CM | POA: Diagnosis not present

## 2020-12-09 DIAGNOSIS — Z79899 Other long term (current) drug therapy: Secondary | ICD-10-CM | POA: Diagnosis not present

## 2020-12-20 DIAGNOSIS — R1901 Right upper quadrant abdominal swelling, mass and lump: Secondary | ICD-10-CM | POA: Diagnosis not present

## 2020-12-29 DIAGNOSIS — E119 Type 2 diabetes mellitus without complications: Secondary | ICD-10-CM | POA: Diagnosis not present

## 2020-12-29 DIAGNOSIS — Z7984 Long term (current) use of oral hypoglycemic drugs: Secondary | ICD-10-CM | POA: Diagnosis not present

## 2020-12-29 DIAGNOSIS — D8989 Other specified disorders involving the immune mechanism, not elsewhere classified: Secondary | ICD-10-CM | POA: Diagnosis not present

## 2020-12-29 DIAGNOSIS — D649 Anemia, unspecified: Secondary | ICD-10-CM | POA: Diagnosis not present

## 2020-12-29 DIAGNOSIS — E785 Hyperlipidemia, unspecified: Secondary | ICD-10-CM | POA: Diagnosis not present

## 2020-12-29 DIAGNOSIS — Z4822 Encounter for aftercare following kidney transplant: Secondary | ICD-10-CM | POA: Diagnosis not present

## 2020-12-29 DIAGNOSIS — I10 Essential (primary) hypertension: Secondary | ICD-10-CM | POA: Diagnosis not present

## 2020-12-29 DIAGNOSIS — Z794 Long term (current) use of insulin: Secondary | ICD-10-CM | POA: Diagnosis not present

## 2020-12-29 DIAGNOSIS — R911 Solitary pulmonary nodule: Secondary | ICD-10-CM | POA: Diagnosis not present

## 2020-12-29 DIAGNOSIS — Z792 Long term (current) use of antibiotics: Secondary | ICD-10-CM | POA: Diagnosis not present

## 2020-12-29 DIAGNOSIS — Z940 Kidney transplant status: Secondary | ICD-10-CM | POA: Diagnosis not present

## 2020-12-29 DIAGNOSIS — Z79899 Other long term (current) drug therapy: Secondary | ICD-10-CM | POA: Diagnosis not present

## 2020-12-29 DIAGNOSIS — R918 Other nonspecific abnormal finding of lung field: Secondary | ICD-10-CM | POA: Diagnosis not present

## 2021-01-26 DIAGNOSIS — Z4822 Encounter for aftercare following kidney transplant: Secondary | ICD-10-CM | POA: Diagnosis not present

## 2021-01-26 DIAGNOSIS — R197 Diarrhea, unspecified: Secondary | ICD-10-CM | POA: Diagnosis not present

## 2021-01-26 DIAGNOSIS — Z792 Long term (current) use of antibiotics: Secondary | ICD-10-CM | POA: Diagnosis not present

## 2021-01-26 DIAGNOSIS — E119 Type 2 diabetes mellitus without complications: Secondary | ICD-10-CM | POA: Diagnosis not present

## 2021-01-26 DIAGNOSIS — R195 Other fecal abnormalities: Secondary | ICD-10-CM | POA: Diagnosis not present

## 2021-01-26 DIAGNOSIS — E1165 Type 2 diabetes mellitus with hyperglycemia: Secondary | ICD-10-CM | POA: Diagnosis not present

## 2021-01-26 DIAGNOSIS — Z5181 Encounter for therapeutic drug level monitoring: Secondary | ICD-10-CM | POA: Diagnosis not present

## 2021-01-26 DIAGNOSIS — I10 Essential (primary) hypertension: Secondary | ICD-10-CM | POA: Diagnosis not present

## 2021-01-26 DIAGNOSIS — R600 Localized edema: Secondary | ICD-10-CM | POA: Diagnosis not present

## 2021-01-26 DIAGNOSIS — E785 Hyperlipidemia, unspecified: Secondary | ICD-10-CM | POA: Diagnosis not present

## 2021-01-26 DIAGNOSIS — Z794 Long term (current) use of insulin: Secondary | ICD-10-CM | POA: Diagnosis not present

## 2021-01-26 DIAGNOSIS — I151 Hypertension secondary to other renal disorders: Secondary | ICD-10-CM | POA: Diagnosis not present

## 2021-01-26 DIAGNOSIS — R918 Other nonspecific abnormal finding of lung field: Secondary | ICD-10-CM | POA: Diagnosis not present

## 2021-01-26 DIAGNOSIS — N2889 Other specified disorders of kidney and ureter: Secondary | ICD-10-CM | POA: Diagnosis not present

## 2021-01-26 DIAGNOSIS — Z23 Encounter for immunization: Secondary | ICD-10-CM | POA: Diagnosis not present

## 2021-01-26 DIAGNOSIS — Z79899 Other long term (current) drug therapy: Secondary | ICD-10-CM | POA: Diagnosis not present

## 2021-01-26 DIAGNOSIS — D649 Anemia, unspecified: Secondary | ICD-10-CM | POA: Diagnosis not present

## 2021-01-26 DIAGNOSIS — Z940 Kidney transplant status: Secondary | ICD-10-CM | POA: Diagnosis not present

## 2021-01-26 DIAGNOSIS — Z7952 Long term (current) use of systemic steroids: Secondary | ICD-10-CM | POA: Diagnosis not present

## 2021-02-09 DIAGNOSIS — Z79899 Other long term (current) drug therapy: Secondary | ICD-10-CM | POA: Diagnosis not present

## 2021-02-09 DIAGNOSIS — A0831 Calicivirus enteritis: Secondary | ICD-10-CM | POA: Diagnosis not present

## 2021-02-09 DIAGNOSIS — Z940 Kidney transplant status: Secondary | ICD-10-CM | POA: Diagnosis not present

## 2021-02-10 DIAGNOSIS — E1165 Type 2 diabetes mellitus with hyperglycemia: Secondary | ICD-10-CM | POA: Diagnosis not present

## 2021-02-10 DIAGNOSIS — Z794 Long term (current) use of insulin: Secondary | ICD-10-CM | POA: Diagnosis not present

## 2021-02-23 DIAGNOSIS — Z7982 Long term (current) use of aspirin: Secondary | ICD-10-CM | POA: Diagnosis not present

## 2021-02-23 DIAGNOSIS — I151 Hypertension secondary to other renal disorders: Secondary | ICD-10-CM | POA: Diagnosis not present

## 2021-02-23 DIAGNOSIS — Z794 Long term (current) use of insulin: Secondary | ICD-10-CM | POA: Diagnosis not present

## 2021-02-23 DIAGNOSIS — D849 Immunodeficiency, unspecified: Secondary | ICD-10-CM | POA: Diagnosis not present

## 2021-02-23 DIAGNOSIS — E1122 Type 2 diabetes mellitus with diabetic chronic kidney disease: Secondary | ICD-10-CM | POA: Diagnosis not present

## 2021-02-23 DIAGNOSIS — E119 Type 2 diabetes mellitus without complications: Secondary | ICD-10-CM | POA: Diagnosis not present

## 2021-02-23 DIAGNOSIS — I2510 Atherosclerotic heart disease of native coronary artery without angina pectoris: Secondary | ICD-10-CM | POA: Diagnosis not present

## 2021-02-23 DIAGNOSIS — N2889 Other specified disorders of kidney and ureter: Secondary | ICD-10-CM | POA: Diagnosis not present

## 2021-02-23 DIAGNOSIS — Z940 Kidney transplant status: Secondary | ICD-10-CM | POA: Diagnosis not present

## 2021-02-23 DIAGNOSIS — A0831 Calicivirus enteritis: Secondary | ICD-10-CM | POA: Diagnosis not present

## 2021-02-23 DIAGNOSIS — I120 Hypertensive chronic kidney disease with stage 5 chronic kidney disease or end stage renal disease: Secondary | ICD-10-CM | POA: Diagnosis not present

## 2021-02-23 DIAGNOSIS — N186 End stage renal disease: Secondary | ICD-10-CM | POA: Diagnosis not present

## 2021-02-23 DIAGNOSIS — R911 Solitary pulmonary nodule: Secondary | ICD-10-CM | POA: Diagnosis not present

## 2021-02-23 DIAGNOSIS — E1165 Type 2 diabetes mellitus with hyperglycemia: Secondary | ICD-10-CM | POA: Diagnosis not present

## 2021-02-23 DIAGNOSIS — Z4822 Encounter for aftercare following kidney transplant: Secondary | ICD-10-CM | POA: Diagnosis not present

## 2021-02-23 DIAGNOSIS — Z79899 Other long term (current) drug therapy: Secondary | ICD-10-CM | POA: Diagnosis not present

## 2021-02-23 DIAGNOSIS — D84821 Immunodeficiency due to drugs: Secondary | ICD-10-CM | POA: Diagnosis not present

## 2021-02-23 DIAGNOSIS — Z7952 Long term (current) use of systemic steroids: Secondary | ICD-10-CM | POA: Diagnosis not present

## 2021-03-02 DIAGNOSIS — A0831 Calicivirus enteritis: Secondary | ICD-10-CM | POA: Diagnosis not present

## 2021-03-11 DIAGNOSIS — Z7985 Long-term (current) use of injectable non-insulin antidiabetic drugs: Secondary | ICD-10-CM | POA: Diagnosis not present

## 2021-03-11 DIAGNOSIS — Z940 Kidney transplant status: Secondary | ICD-10-CM | POA: Diagnosis not present

## 2021-03-11 DIAGNOSIS — E1165 Type 2 diabetes mellitus with hyperglycemia: Secondary | ICD-10-CM | POA: Diagnosis not present

## 2021-03-11 DIAGNOSIS — Z794 Long term (current) use of insulin: Secondary | ICD-10-CM | POA: Diagnosis not present

## 2021-03-24 DIAGNOSIS — B259 Cytomegaloviral disease, unspecified: Secondary | ICD-10-CM | POA: Diagnosis not present

## 2021-03-24 DIAGNOSIS — I10 Essential (primary) hypertension: Secondary | ICD-10-CM | POA: Diagnosis not present

## 2021-03-24 DIAGNOSIS — E785 Hyperlipidemia, unspecified: Secondary | ICD-10-CM | POA: Diagnosis not present

## 2021-03-24 DIAGNOSIS — D849 Immunodeficiency, unspecified: Secondary | ICD-10-CM | POA: Diagnosis not present

## 2021-03-24 DIAGNOSIS — Z5181 Encounter for therapeutic drug level monitoring: Secondary | ICD-10-CM | POA: Diagnosis not present

## 2021-03-24 DIAGNOSIS — E1122 Type 2 diabetes mellitus with diabetic chronic kidney disease: Secondary | ICD-10-CM | POA: Diagnosis not present

## 2021-03-24 DIAGNOSIS — D649 Anemia, unspecified: Secondary | ICD-10-CM | POA: Diagnosis not present

## 2021-03-24 DIAGNOSIS — Z79899 Other long term (current) drug therapy: Secondary | ICD-10-CM | POA: Diagnosis not present

## 2021-03-24 DIAGNOSIS — E119 Type 2 diabetes mellitus without complications: Secondary | ICD-10-CM | POA: Diagnosis not present

## 2021-03-24 DIAGNOSIS — Z79621 Long term (current) use of calcineurin inhibitor: Secondary | ICD-10-CM | POA: Diagnosis not present

## 2021-03-24 DIAGNOSIS — Z7952 Long term (current) use of systemic steroids: Secondary | ICD-10-CM | POA: Diagnosis not present

## 2021-03-24 DIAGNOSIS — Z23 Encounter for immunization: Secondary | ICD-10-CM | POA: Diagnosis not present

## 2021-03-24 DIAGNOSIS — A084 Viral intestinal infection, unspecified: Secondary | ICD-10-CM | POA: Diagnosis not present

## 2021-03-24 DIAGNOSIS — Z4822 Encounter for aftercare following kidney transplant: Secondary | ICD-10-CM | POA: Diagnosis not present

## 2021-03-24 DIAGNOSIS — Z794 Long term (current) use of insulin: Secondary | ICD-10-CM | POA: Diagnosis not present

## 2021-03-24 DIAGNOSIS — R918 Other nonspecific abnormal finding of lung field: Secondary | ICD-10-CM | POA: Diagnosis not present

## 2021-03-24 DIAGNOSIS — Z792 Long term (current) use of antibiotics: Secondary | ICD-10-CM | POA: Diagnosis not present

## 2021-03-24 DIAGNOSIS — Z940 Kidney transplant status: Secondary | ICD-10-CM | POA: Diagnosis not present

## 2021-03-25 DIAGNOSIS — Z7985 Long-term (current) use of injectable non-insulin antidiabetic drugs: Secondary | ICD-10-CM | POA: Diagnosis not present

## 2021-03-25 DIAGNOSIS — E1165 Type 2 diabetes mellitus with hyperglycemia: Secondary | ICD-10-CM | POA: Diagnosis not present

## 2021-03-25 DIAGNOSIS — Z794 Long term (current) use of insulin: Secondary | ICD-10-CM | POA: Diagnosis not present

## 2021-04-21 DIAGNOSIS — E785 Hyperlipidemia, unspecified: Secondary | ICD-10-CM | POA: Diagnosis not present

## 2021-04-21 DIAGNOSIS — R42 Dizziness and giddiness: Secondary | ICD-10-CM | POA: Diagnosis not present

## 2021-04-21 DIAGNOSIS — Z940 Kidney transplant status: Secondary | ICD-10-CM | POA: Diagnosis not present

## 2021-04-21 DIAGNOSIS — Z23 Encounter for immunization: Secondary | ICD-10-CM | POA: Diagnosis not present

## 2021-04-21 DIAGNOSIS — Z794 Long term (current) use of insulin: Secondary | ICD-10-CM | POA: Diagnosis not present

## 2021-04-21 DIAGNOSIS — Z4822 Encounter for aftercare following kidney transplant: Secondary | ICD-10-CM | POA: Diagnosis not present

## 2021-04-21 DIAGNOSIS — I10 Essential (primary) hypertension: Secondary | ICD-10-CM | POA: Diagnosis not present

## 2021-04-21 DIAGNOSIS — Z7952 Long term (current) use of systemic steroids: Secondary | ICD-10-CM | POA: Diagnosis not present

## 2021-04-21 DIAGNOSIS — Z79899 Other long term (current) drug therapy: Secondary | ICD-10-CM | POA: Diagnosis not present

## 2021-04-21 DIAGNOSIS — Z5181 Encounter for therapeutic drug level monitoring: Secondary | ICD-10-CM | POA: Diagnosis not present

## 2021-04-21 DIAGNOSIS — Z79621 Long term (current) use of calcineurin inhibitor: Secondary | ICD-10-CM | POA: Diagnosis not present

## 2021-04-21 DIAGNOSIS — E878 Other disorders of electrolyte and fluid balance, not elsewhere classified: Secondary | ICD-10-CM | POA: Diagnosis not present

## 2021-04-21 DIAGNOSIS — R911 Solitary pulmonary nodule: Secondary | ICD-10-CM | POA: Diagnosis not present

## 2021-04-21 DIAGNOSIS — D649 Anemia, unspecified: Secondary | ICD-10-CM | POA: Diagnosis not present

## 2021-04-21 DIAGNOSIS — E119 Type 2 diabetes mellitus without complications: Secondary | ICD-10-CM | POA: Diagnosis not present

## 2021-05-19 DIAGNOSIS — Z7952 Long term (current) use of systemic steroids: Secondary | ICD-10-CM | POA: Diagnosis not present

## 2021-05-19 DIAGNOSIS — N2889 Other specified disorders of kidney and ureter: Secondary | ICD-10-CM | POA: Diagnosis not present

## 2021-05-19 DIAGNOSIS — I120 Hypertensive chronic kidney disease with stage 5 chronic kidney disease or end stage renal disease: Secondary | ICD-10-CM | POA: Diagnosis not present

## 2021-05-19 DIAGNOSIS — Z79899 Other long term (current) drug therapy: Secondary | ICD-10-CM | POA: Diagnosis not present

## 2021-05-19 DIAGNOSIS — I151 Hypertension secondary to other renal disorders: Secondary | ICD-10-CM | POA: Diagnosis not present

## 2021-05-19 DIAGNOSIS — Z79621 Long term (current) use of calcineurin inhibitor: Secondary | ICD-10-CM | POA: Diagnosis not present

## 2021-05-19 DIAGNOSIS — Z940 Kidney transplant status: Secondary | ICD-10-CM | POA: Diagnosis not present

## 2021-05-19 DIAGNOSIS — E119 Type 2 diabetes mellitus without complications: Secondary | ICD-10-CM | POA: Diagnosis not present

## 2021-05-19 DIAGNOSIS — R911 Solitary pulmonary nodule: Secondary | ICD-10-CM | POA: Diagnosis not present

## 2021-05-19 DIAGNOSIS — Z794 Long term (current) use of insulin: Secondary | ICD-10-CM | POA: Diagnosis not present

## 2021-05-19 DIAGNOSIS — N186 End stage renal disease: Secondary | ICD-10-CM | POA: Diagnosis not present

## 2021-05-19 DIAGNOSIS — E1122 Type 2 diabetes mellitus with diabetic chronic kidney disease: Secondary | ICD-10-CM | POA: Diagnosis not present

## 2021-05-19 DIAGNOSIS — R42 Dizziness and giddiness: Secondary | ICD-10-CM | POA: Diagnosis not present

## 2021-05-19 DIAGNOSIS — Z4822 Encounter for aftercare following kidney transplant: Secondary | ICD-10-CM | POA: Diagnosis not present

## 2021-05-19 DIAGNOSIS — Z792 Long term (current) use of antibiotics: Secondary | ICD-10-CM | POA: Diagnosis not present

## 2021-05-19 DIAGNOSIS — D631 Anemia in chronic kidney disease: Secondary | ICD-10-CM | POA: Diagnosis not present

## 2021-05-19 DIAGNOSIS — Z5181 Encounter for therapeutic drug level monitoring: Secondary | ICD-10-CM | POA: Diagnosis not present

## 2021-05-19 DIAGNOSIS — E785 Hyperlipidemia, unspecified: Secondary | ICD-10-CM | POA: Diagnosis not present

## 2021-05-21 ENCOUNTER — Other Ambulatory Visit: Payer: Self-pay

## 2021-05-21 ENCOUNTER — Ambulatory Visit: Payer: Medicaid Other | Admitting: Internal Medicine

## 2021-05-21 ENCOUNTER — Encounter: Payer: Self-pay | Admitting: Internal Medicine

## 2021-05-21 VITALS — BP 140/80 | HR 73 | Ht 67.0 in | Wt 212.4 lb

## 2021-05-21 DIAGNOSIS — R42 Dizziness and giddiness: Secondary | ICD-10-CM

## 2021-05-21 DIAGNOSIS — E1122 Type 2 diabetes mellitus with diabetic chronic kidney disease: Secondary | ICD-10-CM

## 2021-05-21 DIAGNOSIS — G8929 Other chronic pain: Secondary | ICD-10-CM

## 2021-05-21 DIAGNOSIS — N184 Chronic kidney disease, stage 4 (severe): Secondary | ICD-10-CM

## 2021-05-21 DIAGNOSIS — M5442 Lumbago with sciatica, left side: Secondary | ICD-10-CM

## 2021-05-21 DIAGNOSIS — E6609 Other obesity due to excess calories: Secondary | ICD-10-CM | POA: Diagnosis not present

## 2021-05-21 DIAGNOSIS — Z6833 Body mass index (BMI) 33.0-33.9, adult: Secondary | ICD-10-CM | POA: Diagnosis not present

## 2021-05-21 DIAGNOSIS — Z794 Long term (current) use of insulin: Secondary | ICD-10-CM | POA: Diagnosis not present

## 2021-05-21 DIAGNOSIS — Z23 Encounter for immunization: Secondary | ICD-10-CM

## 2021-05-21 DIAGNOSIS — M5441 Lumbago with sciatica, right side: Secondary | ICD-10-CM | POA: Diagnosis not present

## 2021-05-21 DIAGNOSIS — I129 Hypertensive chronic kidney disease with stage 1 through stage 4 chronic kidney disease, or unspecified chronic kidney disease: Secondary | ICD-10-CM

## 2021-05-21 MED ORDER — CYCLOBENZAPRINE HCL 5 MG PO TABS: ORAL_TABLET | ORAL | 0 refills | Status: DC

## 2021-05-21 MED ORDER — INSULIN LISPRO (1 UNIT DIAL) 100 UNIT/ML (KWIKPEN): PEN_INJECTOR | SUBCUTANEOUS | 11 refills | Status: AC

## 2021-05-21 MED ORDER — MECLIZINE HCL 12.5 MG PO TABS: 12.5000 mg | ORAL_TABLET | Freq: Three times a day (TID) | ORAL | 0 refills | Status: DC | PRN

## 2021-05-21 MED ORDER — OZEMPIC (0.25 OR 0.5 MG/DOSE) 2 MG/1.5ML ~~LOC~~ SOPN: 0.5000 mg | PEN_INJECTOR | SUBCUTANEOUS | 3 refills | Status: DC

## 2021-05-21 MED ORDER — LANTUS SOLOSTAR 100 UNIT/ML ~~LOC~~ SOPN: 20.0000 [IU] | PEN_INJECTOR | Freq: Every day | SUBCUTANEOUS | 3 refills | Status: DC

## 2021-05-21 NOTE — Progress Notes (Signed)
I,Katawbba Wiggins,acting as a Education administrator for Maximino Greenland, MD.,have documented all relevant documentation on the behalf of Maximino Greenland, MD,as directed by  Maximino Greenland, MD while in the presence of Maximino Greenland, MD.  This visit occurred during the SARS-CoV-2 public health emergency.  Safety protocols were in place, including screening questions prior to the visit, additional usage of staff PPE, and extensive cleaning of exam room while observing appropriate contact time as indicated for disinfecting solutions.  Subjective:     Patient ID: Samantha [name] , female    DOB: 05/15/1965 , 56 y.o.   MRN: [id number]   Chief Complaint  Patient presents with   Hypertension   Diabetes    HPI  Patient is here for a diabetes and hypertension follow up. She reports compliance with meds. She is now followed by [facility] End[facility]. She denies headaches, chest pain and shortness of breath.     Hypertension This is a chronic problem. The current episode started more than 1 year ago. The problem has been gradually improving since onset. The problem is controlled. Pertinent negatives include no blurred vision, chest pain, palpitations or shortness of breath. Past treatments include ACE inhibitors and diuretics. The current treatment provides moderate improvement. Compliance problems include exercise.  Hypertensive end-organ damage includes kidney disease.  Diabetes She presents for her follow-up diabetic visit. She has type 2 diabetes mellitus. Her disease course has been stable. Hypoglycemia symptoms include dizziness. Pertinent negatives for diabetes include no blurred vision and no chest pain. There are no hypoglycemic complications. Symptoms are improving. Diabetic complications include nephropathy. Risk factors for coronary artery disease include diabetes mellitus, dyslipidemia, hypertension, obesity and sedentary lifestyle. She is following a generally healthy diet. She participates in exercise  intermittently. Her breakfast blood glucose range is generally 130-140 mg/dl. (120 blood sugar is the highest. Lunch 150-160 Dinner about 200 ) An ACE inhibitor/angiotensin II receptor blocker is contraindicated.    Past Medical History:  Diagnosis Date   Anemia in chronic kidney disease (CKD)    Chronic kidney disease, stage V (HCC)    Hypertension    Uncontrolled diabetes mellitus with microalbuminuria or microproteinuria      Family History  Problem Relation Age of Onset   Hypertension Mother    Diabetes Mother    Kidney disease Mother    Crohn's disease Mother    Diabetes Father    Hypertension Father    Ulcers Father    Kidney disease Father      Current Outpatient Medications:    atorvastatin (LIPITOR) 80 MG tablet, Take 80 mg by mouth daily., Disp: , Rfl:    cyclobenzaprine (FLEXERIL) 5 MG tablet, One tab po qpm prn back/hip pain, take no later than 8pm, Disp: 30 tablet, Rfl: 0   meclizine (ANTIVERT) 12.5 MG tablet, Take 1 tablet (12.5 mg total) by mouth 3 (three) times daily as needed for dizziness., Disp: 30 tablet, Rfl: 0   mycophenolate (MYFORTIC) 180 MG EC tablet, Take by mouth. 4 tabs 2 times per day, Disp: , Rfl:    NIFEdipine (PROCARDIA XL/NIFEDICAL XL) 60 MG 24 hr tablet, Take 1 tablet by mouth daily., Disp: , Rfl:    predniSONE (DELTASONE) 5 MG tablet, Take 5 mg by mouth daily., Disp: , Rfl:    sulfamethoxazole-trimethoprim (BACTRIM) 400-80 MG tablet, Take 1 tablet by mouth 3 (three) times a week., Disp: , Rfl:    tacrolimus (PROGRAF) 1 MG capsule, Take by mouth. 4 tabs in the am, 3  tabs in the pm, Disp: , Rfl:    fexofenadine (ALLEGRA) 180 MG tablet, Take 180 mg by mouth daily as needed for allergies or rhinitis. (Patient not taking: Reported on 11/18/2020), Disp: , Rfl:    insulin glargine (LANTUS SOLOSTAR) 100 UNIT/ML Solostar Pen, Inject 20 Units into the skin daily., Disp: 45 mL, Rfl: 3   insulin lispro (HUMALOG KWIKPEN) 100 UNIT/ML KwikPen, Per sliding scale  through [facility] transplant team, Disp: 15 mL, Rfl: 11   OZEMPIC, 0.25 OR 0.5 MG/DOSE, 2 MG/1.5ML SOPN, Inject 0.5 mg into the skin once a week., Disp: 4.5 mL, Rfl: 3   No Known Allergies   Review of Systems  Constitutional: Negative.   Eyes:  Negative for blurred vision.  Respiratory: Negative.  Negative for shortness of breath.   Cardiovascular: Negative.  Negative for chest pain and palpitations.  Gastrointestinal: Negative.   Musculoskeletal:  Positive for back pain.       She c/o LBP. This is chronic. Denies recent fall/trauma. Denies LE weakness/paresthesias. States LBP is stiff, esp after being seated for long periods of time.   Neurological:  Positive for dizziness.       She c/o intermittent dizziness. Usually occurs when she turns over in bed. Sx initially started 2-3 months ago. Unable to determine what triggered her sx, other than turning over in bed. Denies recent URI. She does feel like the room is spinning. Denies ear pain/hearing loss.   Psychiatric/Behavioral: Negative.      Today's Vitals   05/21/21 0942  BP: 140/80  Pulse: 73  Weight: 212 lb 6.4 oz (96.3 kg)  Height: 5\' 7"  (1.702 m)   Body mass index is 33.27 kg/m.  Wt Readings from Last 3 Encounters:  05/21/21 212 lb 6.4 oz (96.3 kg)  11/18/20 190 lb 9.6 oz (86.5 kg)  06/25/20 183 lb 9.6 oz (83.3 kg)    BP Readings from Last 3 Encounters:  05/21/21 140/80  11/18/20 120/66  06/25/20 122/60    Objective:  Physical Exam Vitals and nursing note reviewed.  Constitutional:      Appearance: Normal appearance.  HENT:     Head: Normocephalic and atraumatic.     Right Ear: Tympanic membrane, ear canal and external ear normal. There is no impacted cerumen.     Left Ear: Tympanic membrane, ear canal and external ear normal. There is no impacted cerumen.     Nose:     Comments: Masked     Mouth/Throat:     Comments: Masked  Eyes:     Extraocular Movements: Extraocular movements intact.  Cardiovascular:      Rate and Rhythm: Normal rate and regular rhythm.     Heart sounds: Normal heart sounds.  Pulmonary:     Effort: Pulmonary effort is normal.     Breath sounds: Normal breath sounds.  Musculoskeletal:        General: Tenderness present. No signs of injury.     Cervical back: Normal range of motion.  Skin:    General: Skin is warm.  Neurological:     General: No focal deficit present.     Mental Status: She is alert.  Psychiatric:        Mood and Affect: Mood normal.        Behavior: Behavior normal.        Assessment And Plan:     1. Hypertensive nephropathy Comments: Chronic, fair control. Goal BP <130/80. Encouraged to follow low sodium diet. Will not make any  med changes today.   2. Type 2 diabetes mellitus with stage 4 chronic kidney disease, with long-term current use of insulin ([place]) Comments: Chronic, followed by Endocrinology at [place] for Lantus/Humalog was sent to her local pharmacy.  - insulin glargine (LANTUS SOLOSTAR) 100 UNIT/ML Solostar Pen; Inject 20 Units into the skin daily.  Dispense: 45 mL; Refill: 3 - insulin lispro (HUMALOG KWIKPEN) 100 UNIT/ML KwikPen; Per sliding scale through [facility] transplant team  Dispense: 15 mL; Refill: 11 - OZEMPIC, 0.25 OR 0.5 MG/DOSE, 2 MG/1.5ML SOPN; Inject 0.5 mg into the skin once a week.  Dispense: 4.5 mL; Refill: 3  3. Dizziness Comments: Sx suggestive of vertigo. I will send meclizine to take prn.  4. Chronic bilateral low back pain with bilateral sciatica Comments: Advised to perform hip opening stretches and Figure 4 stretch. Encouraged to do daily since she sits alot for work. I will also send rx flexeril to use prn.   5. Class 1 obesity due to excess calories with serious comorbidity and body mass index (BMI) of 33.0 to 33.9 in adult Comments:  She is encouraged to strive for BMI less than 30 to decrease cardiac risk. Advised to aim for at least 150 minutes of exercise per week.    6. Need for  vaccination Comments: She was given Pneumovax-23 IM x 1.  - Pneumococcal polysaccharide vaccine 23-valent greater than or equal to 2yo subcutaneous/IM  Patient was given opportunity to ask questions. Patient verbalized understanding of the plan and was able to repeat key elements of the plan. All questions were answered to their satisfaction.   I, Maximino Greenland, MD, have reviewed all documentation for this visit. The documentation on 05/21/21 for the exam, diagnosis, procedures, and orders are all accurate and complete.   IF YOU HAVE BEEN REFERRED TO A SPECIALIST, IT MAY TAKE 1-2 WEEKS TO SCHEDULE/PROCESS THE REFERRAL. IF YOU HAVE NOT HEARD FROM US/SPECIALIST IN TWO WEEKS, PLEASE GIVE [place] A CALL AT [phone].   THE PATIENT IS ENCOURAGED TO PRACTICE SOCIAL DISTANCING DUE TO THE COVID-19 PANDEMIC.

## 2021-05-21 NOTE — Patient Instructions (Signed)

## 2021-06-19 ENCOUNTER — Encounter (HOSPITAL_COMMUNITY): Payer: Self-pay | Admitting: Emergency Medicine

## 2021-06-19 ENCOUNTER — Emergency Department (HOSPITAL_COMMUNITY): Payer: Medicare Other

## 2021-06-19 ENCOUNTER — Inpatient Hospital Stay (HOSPITAL_COMMUNITY)
Admission: EM | Admit: 2021-06-19 | Discharge: 2021-06-22 | DRG: 871 | Disposition: A | Discharge status: Home / Self Care | Payer: Medicare Other | Attending: Internal Medicine | Admitting: Internal Medicine

## 2021-06-19 DIAGNOSIS — E890 Postprocedural hypothyroidism: Secondary | ICD-10-CM | POA: Diagnosis present

## 2021-06-19 DIAGNOSIS — I120 Hypertensive chronic kidney disease with stage 5 chronic kidney disease or end stage renal disease: Secondary | ICD-10-CM | POA: Diagnosis present

## 2021-06-19 DIAGNOSIS — R9431 Abnormal electrocardiogram [ECG] [EKG]: Secondary | ICD-10-CM | POA: Diagnosis not present

## 2021-06-19 DIAGNOSIS — Z20822 Contact with and (suspected) exposure to covid-19: Secondary | ICD-10-CM | POA: Diagnosis present

## 2021-06-19 DIAGNOSIS — R531 Weakness: Secondary | ICD-10-CM

## 2021-06-19 DIAGNOSIS — Z8249 Family history of ischemic heart disease and other diseases of the circulatory system: Secondary | ICD-10-CM

## 2021-06-19 DIAGNOSIS — N3000 Acute cystitis without hematuria: Secondary | ICD-10-CM | POA: Diagnosis present

## 2021-06-19 DIAGNOSIS — A419 Sepsis, unspecified organism: Secondary | ICD-10-CM | POA: Diagnosis not present

## 2021-06-19 DIAGNOSIS — N3289 Other specified disorders of bladder: Secondary | ICD-10-CM | POA: Diagnosis not present

## 2021-06-19 DIAGNOSIS — D638 Anemia in other chronic diseases classified elsewhere: Secondary | ICD-10-CM | POA: Diagnosis present

## 2021-06-19 DIAGNOSIS — Z940 Kidney transplant status: Secondary | ICD-10-CM

## 2021-06-19 DIAGNOSIS — N179 Acute kidney failure, unspecified: Secondary | ICD-10-CM | POA: Diagnosis not present

## 2021-06-19 DIAGNOSIS — N186 End stage renal disease: Secondary | ICD-10-CM | POA: Diagnosis present

## 2021-06-19 DIAGNOSIS — N261 Atrophy of kidney (terminal): Secondary | ICD-10-CM | POA: Diagnosis not present

## 2021-06-19 DIAGNOSIS — K449 Diaphragmatic hernia without obstruction or gangrene: Secondary | ICD-10-CM | POA: Diagnosis not present

## 2021-06-19 DIAGNOSIS — Y830 Surgical operation with transplant of whole organ as the cause of abnormal reaction of the patient, or of later complication, without mention of misadventure at the time of the procedure: Secondary | ICD-10-CM | POA: Diagnosis present

## 2021-06-19 DIAGNOSIS — Z7960 Long term (current) use of unspecified immunomodulators and immunosuppressants: Secondary | ICD-10-CM

## 2021-06-19 DIAGNOSIS — Z794 Long term (current) use of insulin: Secondary | ICD-10-CM

## 2021-06-19 DIAGNOSIS — Z79899 Other long term (current) drug therapy: Secondary | ICD-10-CM

## 2021-06-19 DIAGNOSIS — R6520 Severe sepsis without septic shock: Secondary | ICD-10-CM | POA: Diagnosis present

## 2021-06-19 DIAGNOSIS — D631 Anemia in chronic kidney disease: Secondary | ICD-10-CM | POA: Diagnosis present

## 2021-06-19 DIAGNOSIS — Z7952 Long term (current) use of systemic steroids: Secondary | ICD-10-CM

## 2021-06-19 DIAGNOSIS — K429 Umbilical hernia without obstruction or gangrene: Secondary | ICD-10-CM | POA: Diagnosis not present

## 2021-06-19 DIAGNOSIS — E785 Hyperlipidemia, unspecified: Secondary | ICD-10-CM | POA: Diagnosis present

## 2021-06-19 DIAGNOSIS — Z792 Long term (current) use of antibiotics: Secondary | ICD-10-CM

## 2021-06-19 DIAGNOSIS — Z841 Family history of disorders of kidney and ureter: Secondary | ICD-10-CM

## 2021-06-19 DIAGNOSIS — Z833 Family history of diabetes mellitus: Secondary | ICD-10-CM

## 2021-06-19 DIAGNOSIS — E1122 Type 2 diabetes mellitus with diabetic chronic kidney disease: Secondary | ICD-10-CM | POA: Diagnosis present

## 2021-06-19 DIAGNOSIS — Z7985 Long-term (current) use of injectable non-insulin antidiabetic drugs: Secondary | ICD-10-CM

## 2021-06-19 DIAGNOSIS — E860 Dehydration: Secondary | ICD-10-CM | POA: Diagnosis present

## 2021-06-19 DIAGNOSIS — N1832 Chronic kidney disease, stage 3b: Secondary | ICD-10-CM | POA: Diagnosis present

## 2021-06-19 DIAGNOSIS — I10 Essential (primary) hypertension: Secondary | ICD-10-CM | POA: Diagnosis present

## 2021-06-19 DIAGNOSIS — T8619 Other complication of kidney transplant: Secondary | ICD-10-CM | POA: Diagnosis present

## 2021-06-19 DIAGNOSIS — E872 Acidosis, unspecified: Secondary | ICD-10-CM

## 2021-06-19 HISTORY — DX: Kidney transplant status: Z94.0

## 2021-06-19 LAB — COMPREHENSIVE METABOLIC PANEL
ALT: 27 U/L (ref 0–44)
AST: 34 U/L (ref 15–41)
Albumin: 3.4 g/dL — ABNORMAL LOW (ref 3.5–5.0)
Alkaline Phosphatase: 75 U/L (ref 38–126)
Anion gap: 12 (ref 5–15)
BUN: 63 mg/dL — ABNORMAL HIGH (ref 6–20)
CO2: 17 mmol/L — ABNORMAL LOW (ref 22–32)
Calcium: 9.1 mg/dL (ref 8.9–10.3)
Chloride: 109 mmol/L (ref 98–111)
Creatinine, Ser: 3.54 mg/dL — ABNORMAL HIGH (ref 0.44–1.00)
GFR, Estimated: 15 mL/min — ABNORMAL LOW (ref >60)
Glucose, Bld: 198 mg/dL — ABNORMAL HIGH (ref 70–99)
Potassium: 4.1 mmol/L (ref 3.5–5.1)
Sodium: 138 mmol/L (ref 135–145)
Total Bilirubin: 0.3 mg/dL (ref 0.3–1.2)
Total Protein: 7.8 g/dL (ref 6.5–8.1)

## 2021-06-19 LAB — URINALYSIS, ROUTINE W REFLEX MICROSCOPIC
Bilirubin Urine: NEGATIVE
Glucose, UA: NEGATIVE mg/dL
Ketones, ur: NEGATIVE mg/dL
Nitrite: NEGATIVE
Protein, ur: 100 mg/dL — AB
Specific Gravity, Urine: 1.016 (ref 1.005–1.030)
WBC, UA: >50 WBC/hpf — ABNORMAL HIGH (ref 0–5)
pH: 5 (ref 5.0–8.0)

## 2021-06-19 LAB — LACTIC ACID, PLASMA: Lactic Acid, Venous: 1.7 mmol/L (ref 0.5–1.9)

## 2021-06-19 LAB — CBC WITH DIFFERENTIAL/PLATELET
Abs Immature Granulocytes: 0.14 10*3/uL — ABNORMAL HIGH (ref 0.00–0.07)
Basophils Absolute: 0 10*3/uL (ref 0.0–0.1)
Basophils Relative: 0 %
Eosinophils Absolute: 0 10*3/uL (ref 0.0–0.5)
Eosinophils Relative: 0 %
HCT: 41.7 % (ref 36.0–46.0)
Hemoglobin: 13 g/dL (ref 12.0–15.0)
Immature Granulocytes: 2 %
Lymphocytes Relative: 6 %
Lymphs Abs: 0.4 10*3/uL — ABNORMAL LOW (ref 0.7–4.0)
MCH: 27.8 pg (ref 26.0–34.0)
MCHC: 31.2 g/dL (ref 30.0–36.0)
MCV: 89.1 fL (ref 80.0–100.0)
Monocytes Absolute: 0.3 10*3/uL (ref 0.1–1.0)
Monocytes Relative: 4 %
Neutro Abs: 6.3 10*3/uL (ref 1.7–7.7)
Neutrophils Relative %: 88 %
Platelets: 231 10*3/uL (ref 150–400)
RBC: 4.68 MIL/uL (ref 3.87–5.11)
RDW: 13.8 % (ref 11.5–15.5)
WBC: 7.2 10*3/uL (ref 4.0–10.5)
nRBC: 0 % (ref 0.0–0.2)

## 2021-06-19 LAB — RESP PANEL BY RT-PCR (FLU A&B, COVID) ARPGX2
Influenza A by PCR: NEGATIVE
Influenza B by PCR: NEGATIVE
SARS Coronavirus 2 by RT PCR: NEGATIVE

## 2021-06-19 LAB — PROTIME-INR
INR: 1.1 (ref 0.8–1.2)
Prothrombin Time: 14 seconds (ref 11.4–15.2)

## 2021-06-19 LAB — APTT: aPTT: 27 seconds (ref 24–36)

## 2021-06-19 MED ORDER — SODIUM CHLORIDE 0.9 % IV SOLN
1.0000 g | INTRAVENOUS | Status: DC
  Administered 2021-06-19 – 2021-06-21 (×3): 1 g via INTRAVENOUS
  Filled 2021-06-19 (×3): qty 10

## 2021-06-19 MED ORDER — LACTATED RINGERS IV BOLUS
2000.0000 mL | Freq: Once | INTRAVENOUS | Status: AC
  Administered 2021-06-20: 2000 mL via INTRAVENOUS

## 2021-06-19 MED ORDER — ACETAMINOPHEN 325 MG PO TABS
650.0000 mg | ORAL_TABLET | Freq: Once | ORAL | Status: AC | PRN
  Administered 2021-06-19: 650 mg via ORAL
  Filled 2021-06-19: qty 2

## 2021-06-19 MED ORDER — ACETAMINOPHEN 325 MG PO TABS: 650.0000 mg | ORAL_TABLET | Freq: Once | ORAL | Status: DC

## 2021-06-19 MED ORDER — SODIUM CHLORIDE 0.9 % IV SOLN: 2.0000 g | INTRAVENOUS | Status: DC

## 2021-06-19 NOTE — ED Notes (Signed)
Patient transported to CT 

## 2021-06-19 NOTE — ED Provider Notes (Signed)
[place] DEPT Provider Note   CSN: [id number] Arrival date & time: 06/19/21  2047     History  Chief Complaint  Patient presents with   Fever   Weakness    Samantha [name] is a 56 y.o. female.  56 year old female presents with weakness x2 weeks.  Has been having some myalgias as well as chills.  No vomiting or diarrhea.  No cough or congestion.  Notes normal urine output.  Does have a history of renal transplant about a year and a half ago.  Denies any abdominal pain but does note some flank discomfort.  Has been compliant with her immunosuppressive medications.  Came here at the urging of her family      Home Medications Prior to Admission medications   Medication Sig Start Date End Date Taking? Authorizing Provider  atorvastatin (LIPITOR) 80 MG tablet Take 80 mg by mouth daily. 08/01/20   [provider]  cyclobenzaprine (FLEXERIL) 5 MG tablet One tab po qpm prn back/hip pain, take no later than 8pm 05/21/21   Glendale Chard, MD  fexofenadine (ALLEGRA) 180 MG tablet Take 180 mg by mouth daily as needed for allergies or rhinitis. Patient not taking: Reported on 11/18/2020    [provider]  insulin glargine (LANTUS SOLOSTAR) 100 UNIT/ML Solostar Pen Inject 20 Units into the skin daily. 05/21/21   Glendale Chard, MD  insulin lispro (HUMALOG KWIKPEN) 100 UNIT/ML KwikPen Per sliding scale through [facility] transplant team 05/21/21   Glendale Chard, MD  meclizine (ANTIVERT) 12.5 MG tablet Take 1 tablet (12.5 mg total) by mouth 3 (three) times daily as needed for dizziness. 05/21/21   Glendale Chard, MD  mycophenolate (MYFORTIC) 180 MG EC tablet Take by mouth. 4 tabs 2 times per day 11/03/20   [provider]  NIFEdipine (PROCARDIA XL/NIFEDICAL XL) 60 MG 24 hr tablet Take 1 tablet by mouth daily. 11/06/20   [provider]  OZEMPIC, 0.25 OR 0.5 MG/DOSE, 2 MG/1.5ML SOPN Inject 0.5 mg into the skin once a week. 05/21/21    Glendale Chard, MD  predniSONE (DELTASONE) 5 MG tablet Take 5 mg by mouth daily. 11/13/20   [provider]  sulfamethoxazole-trimethoprim (BACTRIM) 400-80 MG tablet Take 1 tablet by mouth 3 (three) times a week. 11/13/20   [provider]  tacrolimus (PROGRAF) 1 MG capsule Take by mouth. 4 tabs in the am, 3 tabs in the pm 11/13/20   [provider]      Allergies    Patient has no known allergies.    Review of Systems   Review of Systems  All other systems reviewed and are negative.  Physical Exam Updated Vital Signs BP (!) 141/79    Pulse (!) 114    Temp (!) 102.7 F (39.3 C) (Oral)    Resp 16    SpO2 94%  Physical Exam Vitals and nursing note reviewed.  Constitutional:      General: She is not in acute distress.    Appearance: Normal appearance. She is well-developed. She is not toxic-appearing.  HENT:     Head: Normocephalic and atraumatic.  Eyes:     General: Lids are normal.     Conjunctiva/sclera: Conjunctivae normal.     Pupils: Pupils are equal, round, and reactive to light.  Neck:     Thyroid: No thyroid mass.     Trachea: No tracheal deviation.  Cardiovascular:     Rate and Rhythm: Normal rate and regular rhythm.  Heart sounds: Normal heart sounds. No murmur heard.   No gallop.  Pulmonary:     Effort: Pulmonary effort is normal. No respiratory distress.     Breath sounds: Normal breath sounds. No stridor. No decreased breath sounds, wheezing, rhonchi or rales.  Abdominal:     General: There is no distension.     Palpations: Abdomen is soft.     Tenderness: There is no abdominal tenderness. There is no rebound.  Musculoskeletal:        General: No tenderness. Normal range of motion.     Cervical back: Normal range of motion and neck supple.  Skin:    General: Skin is warm and dry.     Findings: No abrasion or rash.  Neurological:     Mental Status: She is alert and oriented to person, place, and time. Mental status is at baseline.      GCS: GCS eye subscore is 4. GCS verbal subscore is 5. GCS motor subscore is 6.     Cranial Nerves: No cranial nerve deficit.     Sensory: No sensory deficit.     Motor: Motor function is intact.  Psychiatric:        Attention and Perception: Attention normal.        Speech: Speech normal.        Behavior: Behavior normal.    ED Results / Procedures / Treatments   Labs (all labs ordered are listed, but only abnormal results are displayed) Labs Reviewed  CBC WITH DIFFERENTIAL/PLATELET - Abnormal; Notable for the following components:      Result Value   Lymphs Abs 0.4 (*)    Abs Immature Granulocytes 0.14 (*)    All other components within normal limits  CULTURE, BLOOD (ROUTINE X 2)  CULTURE, BLOOD (ROUTINE X 2)  URINE CULTURE  RESP PANEL BY RT-PCR (FLU A&B, COVID) ARPGX2  LACTIC ACID, PLASMA  LACTIC ACID, PLASMA  COMPREHENSIVE METABOLIC PANEL  PROTIME-INR  APTT  URINALYSIS, ROUTINE W REFLEX MICROSCOPIC    EKG None  Radiology DG Chest 2 View  Result Date: 06/19/2021 CLINICAL DATA:  Sepsis EXAM: CHEST - 2 VIEW COMPARISON:  CT chest 10/13/2020, 03/29/2020 FINDINGS: The heart size and mediastinal contours are within normal limits. Aortic atherosclerosis. Both lungs are clear. The visualized skeletal structures are unremarkable. IMPRESSION: No active cardiopulmonary disease. Electronically Signed   By: Donavan Foil M.D.   On: 06/19/2021 21:26    Procedures Procedures    Medications Ordered in ED Medications  acetaminophen (TYLENOL) tablet 650 mg (650 mg Oral Given 06/19/21 2137)    ED Course/ Medical Decision Making/ A&P                           Medical Decision Making Amount and/or Complexity of Data Reviewed Radiology: ordered.  Risk OTC drugs. Decision regarding hospitalization.   Patient presented concerning for URI symptoms.  Does have a history of kidney transplant.  Urinalysis showed infection.  Started on IV antibiotics.  Lab work shows worsening renal  function.  Patient given IV hydration.  Patient is imaging pending at this time.  Signed out to next provider        Final Clinical Impression(s) / ED Diagnoses Final diagnoses:  None    Rx / DC Orders ED Discharge Orders     None         Lacretia Leigh, MD 06/23/21 2210

## 2021-06-19 NOTE — ED Triage Notes (Signed)
Pt reports 3 weeks of lack of appetite. States that she really hasnt eaten anything solid during that time, but she has been trying to stay hydrated. She has a hx of a kidney transplant at [facility] in Nov 2022. Reports diarrhea. Febrile in triage.

## 2021-06-19 NOTE — ED Provider Notes (Signed)
°  Provider Note MRN:  [id number]  Arrival date & time: 06/20/21    ED Course and Medical Decision Making  Assumed care from Dr. Zenia Resides at shift change.  History of renal transplant in 2021 here with fever, UTI, AKI.  [facility] without beds, plan is to touch base with [facility] nephrology, suspect they will recommend admission here at [facility] for management.  12 PM update: Discussed case with Dr. Corey Harold of [facility] nephrology, patient should be able to be cared for equally well here at [facility].  Recommending typical treatment for UTI, also recommending ultrasound of the transplanted kidney as well as possibly holding the Myfortic medication for 3 to 5 days.  .Critical Care Performed by: Maudie Flakes, MD Authorized by: Maudie Flakes, MD   Critical care provider statement:    Critical care time (minutes):  35   Critical care was necessary to treat or prevent imminent or life-threatening deterioration of the following conditions:  Sepsis and renal failure   Critical care was time spent personally by me on the following activities:  Development of treatment plan with patient or surrogate, discussions with consultants, evaluation of patient's response to treatment, examination of patient, ordering and review of laboratory studies, ordering and review of radiographic studies, ordering and performing treatments and interventions, pulse oximetry, re-evaluation of patient's condition and review of old charts  Final Clinical Impressions(s) / ED Diagnoses     ICD-10-CM   1. Acute cystitis without hematuria  N30.00     2. Acute kidney injury ([place])  N17.9     3. Renal transplant, status post  Z94.0       ED Discharge Orders     None       Discharge Instructions   None     Barth Kirks. [place]onia Small, [place] [email or web address]    Maudie Flakes, MD 06/20/21 0001

## 2021-06-19 NOTE — ED Provider Triage Note (Signed)
Emergency Medicine Provider Triage Evaluation Note  Samantha [name] , a 56 y.o. female  was evaluated in triage.  Pt complains of feeling poorly for 2 weeks.  She is status post kidney transplant 2 years ago and reports compliance with her medications and immunosuppressive's.  She states that over the past 2 weeks she has intermittently been getting pains in her sides bilaterally.  She did not know she had a fever prior to arrival.  She has reportedly been not herself, laying in the bed according to her visitor like this for 2 weeks now.  Physical Exam  BP 120/76 (BP Location: Left Arm)    Pulse 100    Temp (!) 102.7 F (39.3 C) (Oral)    Resp 18    SpO2 100%  Gen:   Awake, no distress   Resp:  Normal effort  MSK:   Moves extremities without difficulty  Other:  Normal speech  Medical Decision Making  Medically screening exam initiated at 9:09 PM.  Appropriate orders placed.  Nicola Police was informed that the remainder of the evaluation will be completed by another provider, this initial triage assessment does not replace that evaluation, and the importance of remaining in the ED until their evaluation is complete.     Lorin Glass, [place] 06/20/21 1941

## 2021-06-20 ENCOUNTER — Encounter (HOSPITAL_COMMUNITY): Payer: Self-pay | Admitting: Internal Medicine

## 2021-06-20 ENCOUNTER — Other Ambulatory Visit: Payer: Self-pay

## 2021-06-20 ENCOUNTER — Emergency Department (HOSPITAL_COMMUNITY): Payer: Medicare Other

## 2021-06-20 DIAGNOSIS — R531 Weakness: Secondary | ICD-10-CM

## 2021-06-20 DIAGNOSIS — Z841 Family history of disorders of kidney and ureter: Secondary | ICD-10-CM | POA: Diagnosis not present

## 2021-06-20 DIAGNOSIS — Z7960 Long term (current) use of unspecified immunomodulators and immunosuppressants: Secondary | ICD-10-CM | POA: Diagnosis not present

## 2021-06-20 DIAGNOSIS — N1832 Chronic kidney disease, stage 3b: Secondary | ICD-10-CM | POA: Diagnosis present

## 2021-06-20 DIAGNOSIS — A419 Sepsis, unspecified organism: Secondary | ICD-10-CM | POA: Diagnosis not present

## 2021-06-20 DIAGNOSIS — R6520 Severe sepsis without septic shock: Secondary | ICD-10-CM

## 2021-06-20 DIAGNOSIS — Z940 Kidney transplant status: Secondary | ICD-10-CM

## 2021-06-20 DIAGNOSIS — N281 Cyst of kidney, acquired: Secondary | ICD-10-CM | POA: Diagnosis not present

## 2021-06-20 DIAGNOSIS — N3000 Acute cystitis without hematuria: Secondary | ICD-10-CM | POA: Diagnosis not present

## 2021-06-20 DIAGNOSIS — I10 Essential (primary) hypertension: Secondary | ICD-10-CM | POA: Diagnosis not present

## 2021-06-20 DIAGNOSIS — T8619 Other complication of kidney transplant: Secondary | ICD-10-CM | POA: Diagnosis not present

## 2021-06-20 DIAGNOSIS — E1122 Type 2 diabetes mellitus with diabetic chronic kidney disease: Secondary | ICD-10-CM | POA: Diagnosis not present

## 2021-06-20 DIAGNOSIS — N179 Acute kidney failure, unspecified: Secondary | ICD-10-CM | POA: Diagnosis not present

## 2021-06-20 DIAGNOSIS — Z833 Family history of diabetes mellitus: Secondary | ICD-10-CM | POA: Diagnosis not present

## 2021-06-20 DIAGNOSIS — E890 Postprocedural hypothyroidism: Secondary | ICD-10-CM | POA: Diagnosis not present

## 2021-06-20 DIAGNOSIS — Z792 Long term (current) use of antibiotics: Secondary | ICD-10-CM | POA: Diagnosis not present

## 2021-06-20 DIAGNOSIS — Z8249 Family history of ischemic heart disease and other diseases of the circulatory system: Secondary | ICD-10-CM | POA: Diagnosis not present

## 2021-06-20 DIAGNOSIS — E785 Hyperlipidemia, unspecified: Secondary | ICD-10-CM

## 2021-06-20 DIAGNOSIS — Z794 Long term (current) use of insulin: Secondary | ICD-10-CM | POA: Diagnosis not present

## 2021-06-20 DIAGNOSIS — Z7985 Long-term (current) use of injectable non-insulin antidiabetic drugs: Secondary | ICD-10-CM | POA: Diagnosis not present

## 2021-06-20 DIAGNOSIS — Z79899 Other long term (current) drug therapy: Secondary | ICD-10-CM | POA: Diagnosis not present

## 2021-06-20 DIAGNOSIS — Z7952 Long term (current) use of systemic steroids: Secondary | ICD-10-CM | POA: Diagnosis not present

## 2021-06-20 DIAGNOSIS — Z20822 Contact with and (suspected) exposure to covid-19: Secondary | ICD-10-CM | POA: Diagnosis not present

## 2021-06-20 DIAGNOSIS — I120 Hypertensive chronic kidney disease with stage 5 chronic kidney disease or end stage renal disease: Secondary | ICD-10-CM | POA: Diagnosis not present

## 2021-06-20 DIAGNOSIS — Y830 Surgical operation with transplant of whole organ as the cause of abnormal reaction of the patient, or of later complication, without mention of misadventure at the time of the procedure: Secondary | ICD-10-CM | POA: Diagnosis present

## 2021-06-20 DIAGNOSIS — E872 Acidosis, unspecified: Secondary | ICD-10-CM | POA: Diagnosis not present

## 2021-06-20 DIAGNOSIS — D631 Anemia in chronic kidney disease: Secondary | ICD-10-CM | POA: Diagnosis not present

## 2021-06-20 DIAGNOSIS — N186 End stage renal disease: Secondary | ICD-10-CM | POA: Diagnosis not present

## 2021-06-20 DIAGNOSIS — E860 Dehydration: Secondary | ICD-10-CM | POA: Diagnosis not present

## 2021-06-20 LAB — COMPREHENSIVE METABOLIC PANEL
ALT: 24 U/L (ref 0–44)
AST: 30 U/L (ref 15–41)
Albumin: 2.9 g/dL — ABNORMAL LOW (ref 3.5–5.0)
Alkaline Phosphatase: 61 U/L (ref 38–126)
Anion gap: 7 (ref 5–15)
BUN: 61 mg/dL — ABNORMAL HIGH (ref 6–20)
CO2: 15 mmol/L — ABNORMAL LOW (ref 22–32)
Calcium: 8.6 mg/dL — ABNORMAL LOW (ref 8.9–10.3)
Chloride: 113 mmol/L — ABNORMAL HIGH (ref 98–111)
Creatinine, Ser: 3.06 mg/dL — ABNORMAL HIGH (ref 0.44–1.00)
GFR, Estimated: 17 mL/min — ABNORMAL LOW (ref >60)
Glucose, Bld: 156 mg/dL — ABNORMAL HIGH (ref 70–99)
Potassium: 4.1 mmol/L (ref 3.5–5.1)
Sodium: 135 mmol/L (ref 135–145)
Total Bilirubin: 0.2 mg/dL — ABNORMAL LOW (ref 0.3–1.2)
Total Protein: 6.4 g/dL — ABNORMAL LOW (ref 6.5–8.1)

## 2021-06-20 LAB — CBC WITH DIFFERENTIAL/PLATELET
Abs Immature Granulocytes: 0.12 10*3/uL — ABNORMAL HIGH (ref 0.00–0.07)
Basophils Absolute: 0 10*3/uL (ref 0.0–0.1)
Basophils Relative: 0 %
Eosinophils Absolute: 0 10*3/uL (ref 0.0–0.5)
Eosinophils Relative: 0 %
HCT: 37.1 % (ref 36.0–46.0)
Hemoglobin: 11.7 g/dL — ABNORMAL LOW (ref 12.0–15.0)
Immature Granulocytes: 2 %
Lymphocytes Relative: 5 %
Lymphs Abs: 0.3 10*3/uL — ABNORMAL LOW (ref 0.7–4.0)
MCH: 27.6 pg (ref 26.0–34.0)
MCHC: 31.5 g/dL (ref 30.0–36.0)
MCV: 87.5 fL (ref 80.0–100.0)
Monocytes Absolute: 0.3 10*3/uL (ref 0.1–1.0)
Monocytes Relative: 5 %
Neutro Abs: 5.5 10*3/uL (ref 1.7–7.7)
Neutrophils Relative %: 88 %
Platelets: 191 10*3/uL (ref 150–400)
RBC: 4.24 MIL/uL (ref 3.87–5.11)
RDW: 13.9 % (ref 11.5–15.5)
WBC: 6.2 10*3/uL (ref 4.0–10.5)
nRBC: 0 % (ref 0.0–0.2)

## 2021-06-20 LAB — CBG MONITORING, ED
Glucose-Capillary: 170 mg/dL — ABNORMAL HIGH (ref 70–99)
Glucose-Capillary: 182 mg/dL — ABNORMAL HIGH (ref 70–99)
Glucose-Capillary: 224 mg/dL — ABNORMAL HIGH (ref 70–99)

## 2021-06-20 LAB — GLUCOSE, CAPILLARY: Glucose-Capillary: 175 mg/dL — ABNORMAL HIGH (ref 70–99)

## 2021-06-20 LAB — HIV ANTIBODY (ROUTINE TESTING W REFLEX): HIV Screen 4th Generation wRfx: NONREACTIVE

## 2021-06-20 LAB — MAGNESIUM
Magnesium: 2.3 mg/dL (ref 1.7–2.4)
Magnesium: 1.9 mg/dL (ref 1.7–2.4)

## 2021-06-20 MED ORDER — INSULIN GLARGINE-YFGN 100 UNIT/ML ~~LOC~~ SOLN
10.0000 [IU] | Freq: Every day | SUBCUTANEOUS | Status: DC
  Administered 2021-06-20 – 2021-06-22 (×3): 10 [IU] via SUBCUTANEOUS
  Filled 2021-06-20 (×3): qty 0.1

## 2021-06-20 MED ORDER — ATORVASTATIN CALCIUM 40 MG PO TABS
80.0000 mg | ORAL_TABLET | Freq: Every day | ORAL | Status: DC
  Administered 2021-06-20 – 2021-06-22 (×3): 80 mg via ORAL
  Filled 2021-06-20 (×3): qty 2

## 2021-06-20 MED ORDER — LACTATED RINGERS IV SOLN: INTRAVENOUS | Status: DC

## 2021-06-20 MED ORDER — NIFEDIPINE ER OSMOTIC RELEASE 60 MG PO TB24
60.0000 mg | ORAL_TABLET | Freq: Every day | ORAL | Status: DC
  Administered 2021-06-20 – 2021-06-22 (×3): 60 mg via ORAL
  Filled 2021-06-20 (×3): qty 1

## 2021-06-20 MED ORDER — ACETAMINOPHEN 325 MG PO TABS
650.0000 mg | ORAL_TABLET | Freq: Four times a day (QID) | ORAL | Status: DC | PRN
  Administered 2021-06-20: 650 mg via ORAL
  Filled 2021-06-20: qty 2

## 2021-06-20 MED ORDER — TACROLIMUS 1 MG PO CAPS
3.0000 mg | ORAL_CAPSULE | Freq: Every day | ORAL | Status: DC
  Administered 2021-06-20 – 2021-06-21 (×2): 3 mg via ORAL
  Filled 2021-06-20 (×2): qty 3

## 2021-06-20 MED ORDER — ACETAMINOPHEN 650 MG RE SUPP: 650.0000 mg | Freq: Four times a day (QID) | RECTAL | Status: DC | PRN

## 2021-06-20 MED ORDER — PREDNISONE 5 MG PO TABS
5.0000 mg | ORAL_TABLET | Freq: Every day | ORAL | Status: DC
  Administered 2021-06-20 – 2021-06-22 (×3): 5 mg via ORAL
  Filled 2021-06-20 (×3): qty 1

## 2021-06-20 MED ORDER — INSULIN ASPART 100 UNIT/ML IJ SOLN
0.0000 [IU] | Freq: Three times a day (TID) | INTRAMUSCULAR | Status: DC
  Administered 2021-06-20: 3 [IU] via SUBCUTANEOUS
  Administered 2021-06-20 (×2): 2 [IU] via SUBCUTANEOUS
  Administered 2021-06-21: 1 [IU] via SUBCUTANEOUS
  Administered 2021-06-21 (×2): 3 [IU] via SUBCUTANEOUS
  Administered 2021-06-22: 2 [IU] via SUBCUTANEOUS
  Filled 2021-06-20: qty 0.09

## 2021-06-20 MED ORDER — TACROLIMUS 1 MG PO CAPS
4.0000 mg | ORAL_CAPSULE | Freq: Every day | ORAL | Status: DC
  Administered 2021-06-20 – 2021-06-22 (×3): 4 mg via ORAL
  Filled 2021-06-20 (×4): qty 4

## 2021-06-20 NOTE — Progress Notes (Signed)
Patient seen and examined personally, I reviewed the chart, history and physical and admission note, done by admitting physician this morning and agree with the same with following addendum.  Please refer to the morning admission note for more detailed plan of care.  Briefly,  54 yof w/ hx of renal transplant in Nov 21,subsequently on chronic immunosuppressive therapy including chronic prednisone therapy, T2DM,HTN,HLD presenting with 1 to 2 days of fever up to 102 at home with generalized weakness. ED Course: Febrile 102.7; heart rate 93-1 02; blood pressure 114/63 - 143/80 mmHg; respiratory rate 15-23, oxygen saturation 95 to 100% on room air. Labs with metabolic acidosis bicarb 17 creatinine 3.5, lactic acid 1.7 WBC stable 7.2K 88% neutrophil COVID-19 PCR negative blood culture x2 sent, UA cloudy with Dow[place] more than 50 large LE, EKG NSR chest x-ray no acute finding CT renal studies showed no ureteral stone or any evidence of hydronephrosis, showing perivasical stranding consistent with cystitis,  EDP discussed patient's case with [facility] transplant team and w/ Dr. Corey Harold of [facility] nephrology and felt patient was okay to stay in the [facility] health system for further evaluation management of acute kidney injury, and specifically recommended continuation of IV fluids, renal ultrasound, and holding of outpatient mycophenolate for 3 to 5 days.   On my exam she reports she feels better.  No flank pain weakness improving family at the bedside encouraged her to drink more.  Patient notes that she has not been eating or drinking much for the last couple of weeks due to no appetite. On exam alert awake oriented x3 not in distress lung sounds clear, abdomen benign nontender.  Problems: AKI Severe sepsis POA Acute cystitis Kidney transplanted Generalized weakness Hypertension HLD   Creatinine is improving still has AKI with metabolic acidosis. CBC stable, will continue gentle IV fluid hydration,  continue transplant medications including steroid, Prograf but holding CellCept. Discussed Dr. Jonnie Finner who will review/evaluate the patient while she is here Plan of care discussed with patient and patient's family at the bedside

## 2021-06-20 NOTE — Plan of Care (Signed)

## 2021-06-20 NOTE — H&P (Signed)
History and Physical    PLEASE NOTE THAT DRAGON DICTATION SOFTWARE WAS USED IN THE CONSTRUCTION OF THIS NOTE.   Samantha [name] UQJ:[id number] DOB: 05-01-1966 DOA: 06/19/2021  PCP: Glendale Chard, MD  Patient coming from: home   I have personally briefly reviewed patient's old medical records in [place]  Chief Complaint: Fever  HPI: Samantha [name] is a 56 y.o. female with medical history significant for renal transplant in November 2021 subsequently on chronic immunosuppressive therapy including chronic prednisone therapy, type 2 diabetes mellitus, hypertension, hyperlipidemia who is admitted to [facility] on 06/19/2021 with acute kidney injury as well as severe sepsis due to urinary tract infection after presenting from home to [facility] ED complaining of fever.   The patient reports 1 to 2 days of fever, with temperature max at home over that timeframe of 102.  She reports associated generalized weakness in the absence of any associated acute focal weakness, acute focal numbness, paresthesias, facial droop, slurred speech, expressive aphasia, acute change in vision, dysphagia, vertigo.  Denies overt dysuria, gross hematuria, or change in urinary urgency/frequency.  Denies associated chills, full body rigors, or generalized myalgias. Denies any recent headache, neck stiffness, rhinitis, rhinorrhea, sore throat, sob, wheezing, cough, nausea, vomiting, abdominal pain, diarrhea, or rash. No recent traveling or known COVID-19 exposures.  She also denies any recent chest pain, shortness of breath, diaphoresis, palpitations.  She has a history of renal transplant at [facility] in November 2021, subsequently on chronic immunosuppressive therapy via mycophenolate, tacrolimus, and daily prednisone therapy.  Per chart review, posttransplant baseline creatinine appears to be in the range of 1.7-1.9, with most recent prior serum creatinine data point noted to be 1.8 on  11/12/2020.     ED Course:  Vital signs in the ED were notable for the following: Temperature max 102.7; heart rate 93-1 02; blood pressure 114/63 - 143/80 mmHg; respiratory rate 15-23, oxygen saturation 95 to 100% on room air.  Labs were notable for the following: CMP notable for the following: Bicarbonate 17, anion gap 12, BUN 63, creatinine 3.54, glucose 198, and liver enzymes found to be within normal limits.  Lactic acid 1.7.  CBC notable for white blood cell count 7200 with 88% neutrophils.  COVID-19/influenza PCR negative.  Blood cultures x2 as well as urine culture collected prior to initiation of IV antibiotics.  Urinalysis notable for the following: Cloudy specimen with greater than 50 white blood cells, many bacteria, large leukocyte esterase, no squamous epithelial cells, 21-50 red blood cells, 100 protein.  Imaging and additional notable ED work-up: EKG shows sinus rhythm with heart rate 99, normal intervals, nonspecific T wave inversion in aVL, no evidence of ST changes, including no evidence of ST elevation.  Two-view chest x-ray showed no evidence of acute cardiopulmonary process, including no evidence of infiltrate, edema, effusion, or pneumothorax. CT renal stone demonstrates no evidence of ureteral stone or any evidence of hydronephrosis, while showing perivesical stranding consistent with cystitis.   EDP discussed patient's case with [facility] transplant team who conveyed that after greater than 1 year status post renal transplant, that there was not an empiric indication for transfer to transplant center.  Subsequently, EDP also discussed patient's case with Dr. Corey Harold of [facility], who confirmed the above protocol and felt that patient was okay to stay in the [facility] health system for further evaluation management of acute kidney injury, and specifically recommended continuation of IV fluids, renal ultrasound, and holding of outpatient mycophenolate  for 3 to 5  days.  While in the ED, the following were administered: Rocephin, lactated Ringer's x2 L bolus, acetaminophen 650 mg p.o. x1.  Subsequently, the patient was admitted to med telemetry unit at [facility] for further evaluation management of acute kidney injury in the setting of presenting severe sepsis due to urinary tract infection.      Review of Systems: As per HPI otherwise 10 point review of systems negative.   Past Medical History:  Diagnosis Date   Anemia in chronic kidney disease (CKD)    Chronic kidney disease, stage V ([place])    Hypertension    Kidney transplanted    Uncontrolled diabetes mellitus with microalbuminuria or microproteinuria     Past Surgical History:  Procedure Laterality Date   CARDIAC CATHETERIZATION N/A 03/23/2016   Procedure: Left Heart Cath and Coronary Angiography;  Surgeon: Adrian Prows, MD;  Location: [place] [facility];  Service: Cardiovascular;  Laterality: N/A;   CESAREAN SECTION     DILITATION & CURRETTAGE/HYSTROSCOPY WITH NOVASURE ABLATION N/A 10/24/2014   Procedure: DILATATION & CURETTAGE/HYSTEROSCOPY WITH NOVASURE ABLATION;  Surgeon: Sanjuana Kava, MD;  Location: [facility];  Service: Gynecology;  Laterality: N/A;   KIDNEY TRANSPLANT  03/28/2020   [facility]   THYROIDECTOMY      Social History:  reports that she has never smoked. She has never used smokeless tobacco. She reports that she does not drink alcohol and does not use drugs.   No Known Allergies  Family History  Problem Relation Age of Onset   Hypertension Mother    Diabetes Mother    Kidney disease Mother    Crohn's disease Mother    Diabetes Father    Hypertension Father    Ulcers Father    Kidney disease Father     Family history reviewed and not pertinent    Prior to Admission medications   Medication Sig Start Date End Date Taking? Authorizing Provider  atorvastatin (LIPITOR) 80 MG tablet Take 80 mg by mouth daily. 08/01/20   [provider]   cyclobenzaprine (FLEXERIL) 5 MG tablet One tab po qpm prn back/hip pain, take no later than 8pm 05/21/21   Glendale Chard, MD  fexofenadine (ALLEGRA) 180 MG tablet Take 180 mg by mouth daily as needed for allergies or rhinitis. Patient not taking: Reported on 11/18/2020    [provider]  insulin glargine (LANTUS SOLOSTAR) 100 UNIT/ML Solostar Pen Inject 20 Units into the skin daily. 05/21/21   Glendale Chard, MD  insulin lispro (HUMALOG KWIKPEN) 100 UNIT/ML KwikPen Per sliding scale through [facility] transplant team 05/21/21   Glendale Chard, MD  meclizine (ANTIVERT) 12.5 MG tablet Take 1 tablet (12.5 mg total) by mouth 3 (three) times daily as needed for dizziness. 05/21/21   Glendale Chard, MD  mycophenolate (MYFORTIC) 180 MG EC tablet Take by mouth. 4 tabs 2 times per day 11/03/20   [provider]  NIFEdipine (PROCARDIA XL/NIFEDICAL XL) 60 MG 24 hr tablet Take 1 tablet by mouth daily. 11/06/20   [provider]  OZEMPIC, 0.25 OR 0.5 MG/DOSE, 2 MG/1.5ML SOPN Inject 0.5 mg into the skin once a week. 05/21/21   Glendale Chard, MD  predniSONE (DELTASONE) 5 MG tablet Take 5 mg by mouth daily. 11/13/20   [provider]  sulfamethoxazole-trimethoprim (BACTRIM) 400-80 MG tablet Take 1 tablet by mouth 3 (three) times a week. 11/13/20   [provider]  tacrolimus (PROGRAF) 1 MG capsule Take by mouth. 4 tabs in the  am, 3 tabs in the pm 11/13/20   [provider]     Objective    Physical Exam: Vitals:   06/19/21 2256 06/19/21 2315 06/19/21 2355 06/20/21 0030  BP:  123/66 114/83 129/72  Pulse: 94 93 92 87  Resp: (!) 27 18 15 16   Temp: (!) 100.9 F (38.3 C)     TempSrc:      SpO2: 99% 97% 98% 95%    General: appears to be stated age; alert, oriented Skin: warm, dry, no rash Head:  AT/[place] Mouth:  Oral mucosa membranes appear dry, normal dentition Neck: supple; trachea midline Heart:  RRR; did not appreciate any M/R/G Lungs: CTAB, did not  appreciate any wheezes, rales, or rhonchi Abdomen: + BS; soft, ND, NT Vascular: 2+ pedal pulses b/l; 2+ radial pulses b/l Extremities: no peripheral edema, no muscle wasting Neuro: strength and sensation intact in upper and lower extremities b/l    Labs on Admission: I have personally reviewed following labs and imaging studies  CBC: Recent Labs  Lab 06/19/21 2117  WBC 7.2  NEUTROABS 6.3  HGB 13.0  HCT 41.7  MCV 89.1  PLT 638   Basic Metabolic Panel: Recent Labs  Lab 06/19/21 2117 06/20/21 0100  NA 138  --   K 4.1  --   CL 109  --   CO2 17*  --   GLUCOSE 198*  --   BUN 63*  --   CREATININE 3.54*  --   CALCIUM 9.1  --   MG  --  2.3   GFR: CrCl cannot be calculated (Unknown ideal weight.). Liver Function Tests: Recent Labs  Lab 06/19/21 2117  AST 34  ALT 27  ALKPHOS 75  BILITOT 0.3  PROT 7.8  ALBUMIN 3.4*   No results for input(s): LIPASE, AMYLASE in the last 168 hours. No results for input(s): AMMONIA in the last 168 hours. Coagulation Profile: Recent Labs  Lab 06/19/21 2117  INR 1.1   Cardiac Enzymes: No results for input(s): CKTOTAL, CKMB, CKMBINDEX, TROPONINI in the last 168 hours. BNP (last 3 results) No results for input(s): PROBNP in the last 8760 hours. HbA1C: No results for input(s): HGBA1C in the last 72 hours. CBG: No results for input(s): GLUCAP in the last 168 hours. Lipid Profile: No results for input(s): CHOL, HDL, LDLCALC, TRIG, CHOLHDL, LDLDIRECT in the last 72 hours. Thyroid Function Tests: No results for input(s): TSH, T4TOTAL, FREET4, T3FREE, THYROIDAB in the last 72 hours. Anemia Panel: No results for input(s): VITAMINB12, FOLATE, FERRITIN, TIBC, IRON, RETICCTPCT in the last 72 hours. Urine analysis:    Component Value Date/Time   COLORURINE AMBER (A) 06/19/2021 2157   APPEARANCEUR CLOUDY (A) 06/19/2021 2157   LABSPEC 1.016 06/19/2021 2157   PHURINE 5.0 06/19/2021 2157   GLUCOSEU NEGATIVE 06/19/2021 2157   HGBUR  MODERATE (A) 06/19/2021 2157   BILIRUBINUR NEGATIVE 06/19/2021 2157   BILIRUBINUR negative 11/18/2020 [place] NEGATIVE 06/19/2021 2157   PROTEINUR 100 (A) 06/19/2021 2157   UROBILINOGEN 0.2 11/18/2020 1609   NITRITE NEGATIVE 06/19/2021 2157   LEUKOCYTESUR LARGE (A) 06/19/2021 2157    Radiological Exams on Admission: DG Chest 2 View  Result Date: 06/19/2021 CLINICAL DATA:  Sepsis EXAM: CHEST - 2 VIEW COMPARISON:  CT chest 10/13/2020, 03/29/2020 FINDINGS: The heart size and mediastinal contours are within normal limits. Aortic atherosclerosis. Both lungs are clear. The visualized skeletal structures are unremarkable. IMPRESSION: No active cardiopulmonary disease. Electronically Signed   By: Madie Reno.D.  On: 06/19/2021 21:26   US Renal Transplant w/Doppler  Result Date: 06/20/2021 CLINICAL DATA:  Acute renal insufficiency. Right lower quadrant renal transplant. EXAM: ULTRASOUND OF RENAL TRANSPLANT WITH RENAL DOPPLER ULTRASOUND TECHNIQUE: Ultrasound examination of the renal transplant was performed with gray-scale, color and duplex doppler evaluation. COMPARISON:  Ultrasound dated 05/01/2020. FINDINGS: Transplant kidney location: Right lower quadrant Transplant Kidney: Renal measurements: 8.4 x 3.4 x 4.3 cm = volume: 62m. Normal echogenicity. No hydronephrosis or shadowing stone. There is a 1.3 cm cyst. No peritransplant fluid collection. Color flow in the main renal artery:  Yes Color flow in the main renal vein:  Yes Duplex Doppler Evaluation: Main Renal Artery Velocity: 104 cm/sec Main Renal Artery Resistive Index: 0.8 Venous waveform in main renal vein:  Present Intrarenal resistive index in upper pole:  0.6 (normal 0.6-0.8; equivocal 0.8-0.9; abnormal >= 0.9) Intrarenal resistive index in lower pole: 0.6 (normal 0.6-0.8; equivocal 0.8-0.9; abnormal >= 0.9) Bladder: Normal for degree of bladder distention. Other findings:  None. IMPRESSION: 1. Small cyst in the transplant kidney,  otherwise unremarkable ultrasound. 2. No hydronephrosis or peritransplant collection. 3. Patent main renal artery and vein with normal range intrarenal resistive indices. Electronically Signed   By: AAnner CreteM.D.   On: 06/20/2021 01:08   CT Renal Stone Study  Result Date: 06/19/2021 CLINICAL DATA:  Flank pain.  Kidney stone suspected. EXAM: CT ABDOMEN AND PELVIS WITHOUT CONTRAST TECHNIQUE: Multidetector CT imaging of the abdomen and pelvis was performed following the standard protocol without IV contrast. RADIATION DOSE REDUCTION: This exam was performed according to the departmental dose-optimization program which includes automated exposure control, adjustment of the mA and/or kV according to patient size and/or use of iterative reconstruction technique. COMPARISON:  CTs abdomen and pelvis without contrast 08/27/2019, 05/22/2018. more recently a chest CT without contrast was performed 10/13/2020 FINDINGS: Lower chest: Lung bases are clear. The cardiac size is normal. There is a small anterior pericardial effusion not seen previously. Calcification in the mitral and aortic valve planes is again noted, and three-vessel coronary artery calcific disease. Small hiatal hernia. Hepatobiliary: 15 cm length slightly steatotic liver. No focal abnormality is seen without contrast. There are scattered calcifications of the hepatic arterial main and branch arteries at the liver hilum. No calcified gallstones, gallbladder thickening or biliary dilatation are observed. Pancreas: Unremarkable without contrast. Spleen: Unremarkable without contrast , normal in size. Adrenals/Urinary Tract: There is no adrenal mass. Both kidneys have undergone moderate cortical thinning and volume loss over the prior studies. Both kidneys measure 7.8 cm in length with renovascular calcifications. There is a small cyst in the posterosuperior right kidney with no other focal cortical abnormality visible without contrast. There are no  collecting system stones or hydroureteronephrosis. There is mild thickening of the bladder versus nondistention, equivocal perivesical haziness which may suggest cystitis. There is transplant kidney in the right iliac fossa new from prior studies, which shows no stones or hydronephrosis but does show evidence of periureteral stranding , nonspecific and could be related to scarring with the renal transplant or inflammatory process. The transplant renal cortex is normal in thickness. Stomach/Bowel: Small hiatal hernia. No wall thickening or dilatation there is seen including the appendix. There is fluid in the ascending and transverse colon without wall thickening. Scattered sigmoid diverticula without diverticulitis. Vascular/Lymphatic: There is extensive aortic, iliac and branch vessel atherosclerosis without AAA. No adenopathy is seen. Reproductive: The uterus is intact. There are heavy vascular calcifications in its outer wall, extending to lower pelvic sidewalls  the uterus and ovaries are not enlarged. Other: In the right upper abdomen anterior to the left lobe of liver there is a partially fatty mass not seen on prior studies of the abdomen and pelvis but was seen on last year's chest CT, with a few punctate calcifications inferiorly, irregular shape and measuring 3.1 x 0.9 by 2.4 cm. Smaller similar lesion is seen just inferior to this. These are most likely omental infarcts or other benign fat necrosis. Partially calcified lymph nodes or possible as well. There is no free air, hemorrhage or fluid. Musculoskeletal: No worrisome regional skeletal lesions. Mild facet hypertrophy lumbar spine. Small umbilical fat hernia. IMPRESSION: 1. No evidence of urinary stones or obstruction including of the transplanted kidney in the right iliac fossa, new from prior abdomen pelvis CTs. 2. The bladder is contracted but it is possible the patient could have cystitis, with slight perivesical stranding noted. Correlation with  urinalysis is recommended. 3. There is stranding around the transplant ureter, which could be postsurgical scarring or inflammatory process. There is no hydronephrosis. 4. Extensive aortoiliac and branch vessel atherosclerosis. Calcification in the mitral and aortic valves. 5. Significant interval renal atrophy. 6. Small hiatal hernia. Small umbilical fat hernia. No bowel obstruction, incarceration or inflammation. 7. Fluid in the colon. 8. Small pericardial effusion. 9. Foci of likely fat necrosis in the right upper quadrant. Probable old omental infarcts new since prior abdomen pelvis CTs. Electronically Signed   By: Telford Nab M.D.   On: 06/19/2021 23:37     EKG: Independently reviewed, with result as described above.    Assessment/Plan    Principal Problem:   AKI (acute kidney injury) ([place]) Active Problems:   Severe sepsis ([place])   Acute cystitis   Kidney transplanted   Generalized weakness   Hypertension   HLD (hyperlipidemia)      #) Acute kidney injury: In the setting of a history of renal transplant November 2021, with ensuing baseline creatinine range of 1.7-1.9, with most recent prior value of 1.8 in July 2022, presenting creatinine noted to be elevated at 3.54.  Case was discussed with Dr. [facility], that it is okay for the patient to remain in the [facility] health system for evaluation management of acute kidney injury, as further detailed above, and felt that presenting AKI was more likely in the setting of severe sepsis due to UTI, with associated recommendations to treat UTI in standard fashion, as well as to provide IV fluids, pursue renal ultrasound, and to hold outpatient mycophenolate for 3 to 5 days, as further detailed above.  Of note, presenting urinalysis was consistent with urinary tract infection, was also notable for 21-50 red blood cells as well as 100 protein.  CT renal stone showed no evidence of postrenal obstruction, as above.  In the setting of  history of renal transplant, she is on chronic immunosuppressive therapy for mycophenolate, tacrolimus, and daily prednisone therapy.  Plan: Continuous lactated Ringer's.  Further evaluation management of severe sepsis due to UTI.  Per nephrology consultation, will pursue renal ultrasound, and hold mycophenolate for 3 to 5 days.  Monitor strict I's and O's and daily weights.  Tempt avoid nephrotoxic agents.  Add on random urine sodium as well as random urine creatinine.  Repeat BMP in the morning.  Continue outpatient tacrolimus and daily prednisone therapy.         #) Severe sepsis due to urinary tract infection: In the setting of presenting objective fever, urinalysis noted to be consistent  with UTI. SIRS criteria met via objective fever, and tachycardia. Lactic acid level: 1.7. Of note, given the associated presence of suspected end organ damage in the form of concominant presenting AKI, criteria are met for pt's sepsis to be considered severe in nature. However, in the absence of lactic acid level that is greater than or equal to 4.0, and in the absence of any associated hypotension refractory to IVF's, there are no indications for administration of a 30 mL/kg IVF bolus at this time.   No e/o additional infectious process at this time, including negative COVID-19/influenza PCR, while chest x-ray shows no evidence of acute cardiopulmonary process including no evidence of infiltrate  Case discussed with [facility], as above, with recommendation for standard antibiotic intervention for patient's UTI, as above.  Blood cultures x2 as well as urine specimen sent for culture prior to initiation of Rocephin.    Plan: CBC w/ diff in AM.  Follow for results of blood cx's x 2 as well as urine culture. Abx: Continue Rocephin.  IV fluids, as above.  Close monitoring of ensuing renal function, as above, including repeat CMP in the morning.       #) Generalized weakness:  1-2 duration of  generalized weakness, in the absence of any evidence of acute focal neurologic deficits, including no evidence of acute focal weakness. Suspect contribution from physiologic stress stemming from presenting severe sepsis due to UTI as well as consequential AKI, as above.  No e/o additional infectious process at this time, as further detailed above.    Plan: work-up and management of presenting severe sepsis due to UTI as well as AKI, as described above. PT/OT consults ordered for the AM.  IV fluids, as above .CMP/CBC in the AM.          #) Type 2 Diabetes Mellitus: documented history of such. Home insulin regimen: Lantus 20 units subcu daily as well as sliding scale Humalog 3 times daily with meals. Home oral hypoglycemic agents: None. presenting blood sugar: 198.   Plan: accuchecks QAC and HS with low dose SSI.  Will resume approximately half of her basal insulin dose, starting with Lantus 10 units subcu daily.       #) Essential Hypertension: documented h/o such, with outpatient antihypertensive regimen including nifedipine.  SBP's in the ED today: Normotensive.   Plan: Close monitoring of subsequent BP via routine VS. continue outpatient nifedipine.        #) Hyperlipidemia: documented h/o such. On high intensity atorvastatin as outpatient.    Plan: continue home statin.        DVT prophylaxis: SCD's   Code Status: Full code Family Communication: none Disposition Plan: Per Rounding Team Consults called: Case was discussed with [facility] transplant service as well as Dr. Corey Harold Of [facility], as further detailed above;  Admission status: Inpatient; med telemetry    PLEASE NOTE THAT DRAGON DICTATION SOFTWARE WAS USED IN THE CONSTRUCTION OF THIS NOTE.   Bajandas DO [facility] [place]   06/20/2021, 1:25 AM

## 2021-06-20 NOTE — Evaluation (Signed)
Physical Therapy Evaluation Patient Details Name: CAROLEE CHANNELL MRN: [id number] DOB: 1966/01/26 Today's Date: 06/20/2021  History of Present Illness  56 y.o. female with medical history significant for renal transplant in November 2021 subsequently on chronic immunosuppressive therapy including chronic prednisone therapy, type 2 diabetes mellitus, hypertension, hyperlipidemia who is admitted to [facility] on 06/19/2021 with acute kidney injury as well as severe sepsis due to urinary tract infection after [facility] complaining of fever.  Clinical Impression  Pt admitted with above diagnosis. Pt ambulated 75' x 2 holding IV pole, no loss of balance. Pt stated this is the most she's been able to walk in several days. Encouraged pt to performed seated BLE strengthening exercises during hospitalization to minimize deconditioning. Good progress expected.  Pt currently with functional limitations due to the deficits listed below (see PT Problem List). Pt will benefit from skilled PT to increase their independence and safety with mobility to allow discharge to the venue listed below.          Recommendations for follow up therapy are one component of a multi-disciplinary discharge planning process, led by the attending physician.  Recommendations may be updated based on patient status, additional functional criteria and insurance authorization.  Follow Up Recommendations No PT follow up    Assistance Recommended at Discharge Set up Supervision/Assistance  Patient can return home with the following       Equipment Recommendations None recommended by PT  Recommendations for Other Services       Functional Status Assessment Patient has had a recent decline in their functional status and demonstrates the ability to make significant improvements in function in a reasonable and predictable amount of time.     Precautions / Restrictions Precautions Precautions:  None Precaution Comments: denies falls in past 6 months Restrictions Weight Bearing Restrictions: No      Mobility  Bed Mobility Overal bed mobility: Modified Independent             General bed mobility comments: HOB up 30*    Transfers Overall transfer level: Independent Equipment used: None                    Ambulation/Gait Ambulation/Gait assistance: Modified independent (Device/Increase time) Gait Distance (Feet): 75 Feet Assistive device: IV Pole Gait Pattern/deviations: Step-through pattern, Decreased step length - left, Decreased step length - right Gait velocity: decr     General Gait Details: 26' x 2 with seated rest in ED bathroom; steady with IV pole, decreased velocity, no loss of balance  Stairs            Wheelchair Mobility    Modified Rankin (Stroke Patients Only)       Balance Overall balance assessment: Modified Independent                                           Pertinent Vitals/Pain Pain Assessment Pain Assessment: No/denies pain    Home Living Family/patient expects to be discharged to:: Private residence Living Arrangements: Children Available Help at Discharge: Family;Available PRN/intermittently   Home Access: Stairs to enter   Entrance Stairs-Number of Steps: 1   Home Layout: One level Home Equipment: Cane - single point      Prior Function Prior Level of Function : Independent/Modified Independent;Driving  Mobility Comments: has a cane, usually doesn't use it but did use it just PTA 2* feeling weak. No falls in past 6 months ADLs Comments: independent     Hand Dominance        Extremity/Trunk Assessment   Upper Extremity Assessment Upper Extremity Assessment: Overall WFL for tasks assessed    Lower Extremity Assessment Lower Extremity Assessment: Overall WFL for tasks assessed    Cervical / Trunk Assessment Cervical / Trunk Assessment: Normal   Communication   Communication: No difficulties  Cognition Arousal/Alertness: Awake/alert Behavior During Therapy: WFL for tasks assessed/performed Overall Cognitive Status: Within Functional Limits for tasks assessed                                          General Comments      Exercises  Demonstrated long arc quads and seated marching, encouraged pt to perform these independently to minimize deconditioning   Assessment/Plan    PT Assessment Patient needs continued PT services  PT Problem List Decreased activity tolerance       PT Treatment Interventions Gait training;Therapeutic exercise    PT Goals (Current goals can be found in the Care Plan section)  Acute Rehab PT Goals Patient Stated Goal: exercise groups and outings with city parks and rec dept PT Goal Formulation: With patient Time For Goal Achievement: 07/04/21 Potential to Achieve Goals: Good    Frequency Min 3X/week     Co-evaluation               AM-PAC PT "6 Clicks" Mobility  Outcome Measure Help needed turning from your back to your side while in a flat bed without using bedrails?: None Help needed moving from lying on your back to sitting on the side of a flat bed without using bedrails?: None Help needed moving to and from a bed to a chair (including a wheelchair)?: None Help needed standing up from a chair using your arms (e.g., wheelchair or bedside chair)?: None Help needed to walk in hospital room?: None Help needed climbing 3-5 steps with a railing? : A Little 6 Click Score: 23    End of Session Equipment Utilized During Treatment: Gait belt Activity Tolerance: Patient tolerated treatment well Patient left: in bed;with call bell/phone within reach;with family/visitor present Nurse Communication: Mobility status PT Visit Diagnosis: Difficulty in walking, not elsewhere classified (R26.2)    Time: 9509-3267 PT Time Calculation (min) (ACUTE ONLY): 22 min   Charges:    PT Evaluation $PT Eval Low Complexity: 1 Low         Philomena Doheny PT 06/20/2021  Acute Rehabilitation Services Pager [phone] Office [phone]

## 2021-06-20 NOTE — Consult Note (Signed)
Renal Service Consult Note [facility]  Samantha [name] 06/20/2021 Sol Blazing, MD Requesting Physician: Dr. Velia Meyer  Reason for Consult: Renal failure HPI: The patient is a 56 y.o. year-old w/ hx of HTN, anemia ckd, ESRD w/ hx of kidney transplant in Nov 2021 ([facility]) and DM2 who presented on 06/19/21 to ED w/ c/o fevers for 1-2 days, tmax 102 at home. Associated gen weakness, poor po intake. No urinary symptoms. In ED labs showed creatinine 3.54 (baseline 1.7- 1.9), WBC 7K, COVID neg, glu 198.  Cx's were sent blood and urine. She rec'd 2 L LR bolus then LR at 100 cc /hr. IV abx were started and pt was admitted. Today creat is 3.0. Asked to see for renal failure.   Pt had transplant at [facility] in Nov 2021, and is still followed by [facility]. She described about 2 wks hx of myalgias, fatigue, poor appetite and mild fevers. No abd pain no n/v/d. No dysuria or voiding issues, no change in urine color.   ROS - denies CP, no joint pain, no HA, no blurry vision, no rash, no diarrhea, no nausea/ vomiting, no dysuria, no difficulty voiding   Past Medical History  Past Medical History:  Diagnosis Date   Anemia in chronic kidney disease (CKD)    Chronic kidney disease, stage V ([place])    Hypertension    Kidney transplanted    Uncontrolled diabetes mellitus with microalbuminuria or microproteinuria    Past Surgical History  Past Surgical History:  Procedure Laterality Date   CARDIAC CATHETERIZATION N/A 03/23/2016   Procedure: Left Heart Cath and Coronary Angiography;  Surgeon: Adrian Prows, MD;  Location: [place] [facility];  Service: Cardiovascular;  Laterality: N/A;   CESAREAN SECTION     DILITATION & CURRETTAGE/HYSTROSCOPY WITH NOVASURE ABLATION N/A 10/24/2014   Procedure: DILATATION & CURETTAGE/HYSTEROSCOPY WITH NOVASURE ABLATION;  Surgeon: Sanjuana Kava, MD;  Location: [place] [facility];  Service: Gynecology;  Laterality: N/A;   KIDNEY TRANSPLANT  03/28/2020   [facility]   THYROIDECTOMY      Family History  Family History  Problem Relation Age of Onset   Hypertension Mother    Diabetes Mother    Kidney disease Mother    Crohn's disease Mother    Diabetes Father    Hypertension Father    Ulcers Father    Kidney disease Father    Social History  reports that she has never smoked. She has never used smokeless tobacco. She reports that she does not drink alcohol and does not use drugs. Allergies No Known Allergies Home medications Prior to Admission medications   Medication Sig Start Date End Date Taking? Authorizing Provider  atorvastatin (LIPITOR) 80 MG tablet Take 80 mg by mouth daily. 08/01/20  Yes [provider]  cyclobenzaprine (FLEXERIL) 5 MG tablet One tab po qpm prn back/hip pain, take no later than 8pm Patient taking differently: Take 5 mg by mouth at bedtime as needed (back/hip pain). 05/21/21  Yes Glendale Chard, MD  insulin glargine (LANTUS SOLOSTAR) 100 UNIT/ML Solostar Pen Inject 20 Units into the skin daily. 05/21/21  Yes Glendale Chard, MD  insulin lispro (HUMALOG KWIKPEN) 100 UNIT/ML KwikPen Per sliding scale through [facility] transplant team Patient taking differently: 0-10 Units. Per sliding scale through [facility] transplant team 05/21/21  Yes Glendale Chard, MD  mycophenolate (MYFORTIC) 180 MG EC tablet Take 720 mg by mouth 2 (two) times daily. 11/03/20  Yes [provider]  NIFEdipine (PROCARDIA XL/NIFEDICAL XL) 60 MG 24  hr tablet Take 60 mg by mouth daily. 11/06/20  Yes [provider]  OZEMPIC, 0.25 OR 0.5 MG/DOSE, 2 MG/1.5ML SOPN Inject 0.5 mg into the skin once a week. 05/21/21  Yes Glendale Chard, MD  predniSONE (DELTASONE) 5 MG tablet Take 5 mg by mouth daily. 11/13/20  Yes [provider]  sodium bicarbonate 650 MG tablet Take 1,300 mg by mouth 3 (three) times daily. 04/06/21  Yes [provider]  sulfamethoxazole-trimethoprim (BACTRIM) 400-80 MG tablet Take 1 tablet by mouth 3 (three) times a week. 11/13/20   Yes [provider]  tacrolimus (PROGRAF) 1 MG capsule Take 3-4 mg by mouth See admin instructions. 82m in the morning, 391min the evening 11/13/20  Yes [provider]  meclizine (ANTIVERT) 12.5 MG tablet Take 1 tablet (12.5 mg total) by mouth 3 (three) times daily as needed for dizziness. Patient not taking: Reported on 06/20/2021 05/21/21   SaGlendale ChardMD     Vitals:   06/20/21 1533 06/20/21 1705 06/20/21 1706 06/20/21 1742  BP: (!) 110/56 (!) 109/44  (!) 114/54  Pulse: 75  76 75  Resp: 18 (!) 23 (!) 27 18  Temp:   98.7 F (37.1 C) 98.2 F (36.8 C)  TempSrc:   Oral Oral  SpO2: 100%  97% 96%  Weight:      Height:       Exam Gen alert, no distress No rash, cyanosis or gangrene Sclera anicteric, throat clear  No jvd or bruits, flat neck veins Chest clear bilat to bases, no rales/ wheezing RRR no MRG Abd soft ntnd no mass or ascites +bs GU deferred MS no joint effusions or deformity Ext no LE or UE edema, no wounds or ulcers Neuro is alert, Ox 3 , nf     Date   Creat  eGFR   2014- 2016  2.50- 2.75   2017   3.34   2020   5.06   April -June 2021 8.10- 8.83 Transplant in 2021   Feb 2022  1.73  38     July 2022  1.90  34   Feb 10  3.54  15   Jun 20, 2021  3.06  17 ml/min    Home meds include - lipitor, felxeril, lantus / humalog insulin, myfortic 720 bid, procardia xl 60 qd, ozempic 0.5 mg weekly, pred 5 mg qd, prograf 83m56mm and 3 mg pm, bactrium 400-80 1 tab mwf, prns/ vits/ supps    UA 2/10 - cloudy, amber, mod Hb, large LE, neg nitrite, prot 100, many bact 21-50 rbc, >50 wbc w/ clumps    UCx, BCXs pending     Transplant renal US [place]/23- IMPRESSION: 1. Small cyst in the transplant kidney, otherwise unremarkable ultrasound. 2. No hydronephrosis or peritransplant collection. 3. Patent main renal artery and vein with normal range intrarenal resistive indices.     Abd CT renal stone study 2/10 - IMPRESSION: 1. No evidence of urinary stones or  obstruction including of the transplanted kidney in the right iliac fossa, new from prior abdomen pelvis CTs. 2. The bladder is contracted but it is possible the patient could have cystitis, with slight perivesical stranding noted. Correlation with urinalysis is recommended. 3. There is stranding around the transplant ureter, which could be postsurgical scarring or inflammatory process. There is no hydronephrosis. 4. Extensive aortoiliac and branch vessel atherosclerosis. Calcification in the mitral and aortic valves. 5. Significant interval renal atrophy. 6. Small hiatal hernia. Small umbilical fat hernia. No bowel  obstruction, incarceration or inflammation. 7. Fluid in the colon .8. Small pericardial effusion. 9. Foci of likely fat necrosis in the right upper quadrant. Probable old omental infarcts new since prior abdomen pelvis CTs.     B/l creat 1.73- 1.90 in mid 2022, eGFR 34- 38 ml/min, CKD 3b transplant.     UNa, UCr pending    Assessment/ Plan: AKI on CKD 3b transplant - B/l creat 1.73- 1.90 in mid 2022, eGFR 34- 38 ml/min. Creat here 3.54 on admission 2/10 in setting of body aches  and poor po intake for 2 wks.  Imaging is w/o obstruction. UA suggests infection and CT suggests bladder infection/ cystitis. No hypotension, no signs of transplant inflammation by CT. Suspect AKI due to volume depletion primarily, less likely transplant pyelonephritis. Still dry on exam. Will rebolus 2 L and change IVF to bicarb at 125 cc/hr. Will follow.   SP renal transplant - Nov 2021, on prograf, pred, mycophenolate. Will resume mycophenalate as there is no leukopenia.  Sepsis - r/o UTI. No hypotension/ shock. Getting IV abx empirically.  Generalized weakness - dehydration + infection, improving. Feels much better.      Kelly Splinter  MD 06/20/2021, 6:43 PM  Recent Labs  Lab 06/19/21 2117 06/20/21 0500  WBC 7.2 6.2  HGB 13.0 11.7*   Recent Labs  Lab 06/19/21 2117 06/20/21 0500  K 4.1 4.1  BUN  63* 61*  CREATININE 3.54* 3.06*  ALBUMIN 3.4* 2.9*  CALCIUM 9.1 8.6*

## 2021-06-20 NOTE — Hospital Course (Addendum)
39 yof w/ hx of renal transplant in Nov 21,subsequently on chronic immunosuppressive therapy including chronic prednisone therapy, T2DM,HTN,HLD presenting with 1 to 2 days of fever up to 102 at home with generalized weakness. ED Course: Febrile 102.7; heart rate 93-1 02; blood pressure 114/63 - 143/80 mmHg; respiratory rate 15-23, oxygen saturation 95 to 100% on room air. Labs with metabolic acidosis bicarb 17 creatinine 3.5, lactic acid 1.7 WBC stable 7.2K 88% neutrophil COVID-19 PCR negative blood culture x2 sent, UA cloudy with [place] more than 50 large LE, EKG NSR chest x-ray no acute finding CT renal studies showed no ureteral stone or any evidence of hydronephrosis, showing perivasical stranding consistent with cystitis,  EDP discussed patient's case with [facility] transplant team and w/ Dr. Corey Harold of [facility] nephrology and felt patient was okay to stay in the [facility] health system for further evaluation management of acute kidney injury, and specifically recommended continuation of IV fluids, renal ultrasound, and holding of outpatient mycophenolate for 3 to 5 days. Patient is seen by nephrology continue on aggressive IV fluid hydration AKI has resolved.  Generalized weakness resolved.  Potassium on lower side was repleted.  She is to call her transplant team and follow-up with them, she may resume her CellCept at home At this time she is being discharged home.

## 2021-06-21 DIAGNOSIS — E872 Acidosis, unspecified: Secondary | ICD-10-CM

## 2021-06-21 DIAGNOSIS — N1832 Chronic kidney disease, stage 3b: Secondary | ICD-10-CM | POA: Diagnosis not present

## 2021-06-21 DIAGNOSIS — N179 Acute kidney failure, unspecified: Secondary | ICD-10-CM | POA: Diagnosis not present

## 2021-06-21 LAB — CBC
HCT: 34.3 % — ABNORMAL LOW (ref 36.0–46.0)
Hemoglobin: 10.7 g/dL — ABNORMAL LOW (ref 12.0–15.0)
MCH: 27.6 pg (ref 26.0–34.0)
MCHC: 31.2 g/dL (ref 30.0–36.0)
MCV: 88.4 fL (ref 80.0–100.0)
Platelets: 189 10*3/uL (ref 150–400)
RBC: 3.88 MIL/uL (ref 3.87–5.11)
RDW: 14 % (ref 11.5–15.5)
WBC: 6.5 10*3/uL (ref 4.0–10.5)
nRBC: 0 % (ref 0.0–0.2)

## 2021-06-21 LAB — GLUCOSE, CAPILLARY
Glucose-Capillary: 135 mg/dL — ABNORMAL HIGH (ref 70–99)
Glucose-Capillary: 209 mg/dL — ABNORMAL HIGH (ref 70–99)
Glucose-Capillary: 240 mg/dL — ABNORMAL HIGH (ref 70–99)
Glucose-Capillary: 260 mg/dL — ABNORMAL HIGH (ref 70–99)

## 2021-06-21 LAB — BASIC METABOLIC PANEL
Anion gap: 8 (ref 5–15)
BUN: 49 mg/dL — ABNORMAL HIGH (ref 6–20)
CO2: 16 mmol/L — ABNORMAL LOW (ref 22–32)
Calcium: 8.4 mg/dL — ABNORMAL LOW (ref 8.9–10.3)
Chloride: 113 mmol/L — ABNORMAL HIGH (ref 98–111)
Creatinine, Ser: 2.41 mg/dL — ABNORMAL HIGH (ref 0.44–1.00)
GFR, Estimated: 23 mL/min — ABNORMAL LOW (ref >60)
Glucose, Bld: 142 mg/dL — ABNORMAL HIGH (ref 70–99)
Potassium: 3.7 mmol/L (ref 3.5–5.1)
Sodium: 137 mmol/L (ref 135–145)

## 2021-06-21 MED ORDER — SODIUM CHLORIDE 0.9 % IV BOLUS: 1500.0000 mL | Freq: Once | INTRAVENOUS | Status: DC

## 2021-06-21 MED ORDER — MYCOPHENOLATE SODIUM 180 MG PO TBEC
720.0000 mg | DELAYED_RELEASE_TABLET | Freq: Two times a day (BID) | ORAL | Status: DC
  Administered 2021-06-21: 720 mg via ORAL
  Filled 2021-06-21: qty 4

## 2021-06-21 MED ORDER — SODIUM BICARBONATE 8.4 % IV SOLN
INTRAVENOUS | Status: DC
  Filled 2021-06-21 (×3): qty 150

## 2021-06-21 MED ORDER — SODIUM CHLORIDE 0.9 % IV BOLUS
2000.0000 mL | Freq: Once | INTRAVENOUS | Status: AC
  Administered 2021-06-21: 2000 mL via INTRAVENOUS

## 2021-06-21 NOTE — Assessment & Plan Note (Addendum)
Resolved.  She will continue her home bicarb

## 2021-06-21 NOTE — Progress Notes (Signed)
PROGRESS NOTE Samantha [name]  HQP:[id number] DOB: November 08, 1965 DOA: 06/19/2021 PCP: Glendale Chard, MD   Brief Narrative/Hospital Course: Samantha [name], 56 y.o. female 89 yof w/ hx of renal transplant in Nov 21,subsequently on chronic immunosuppressive therapy including chronic prednisone therapy, T2DM,HTN,HLD presenting with 1 to 2 days of fever up to 102 at home with generalized weakness. ED Course: Febrile 102.7; heart rate 93-1 02; blood pressure 114/63 - 143/80 mmHg; respiratory rate 15-23, oxygen saturation 95 to 100% on room air. Labs with metabolic acidosis bicarb 17 creatinine 3.5, lactic acid 1.7 WBC stable 7.2K 88% neutrophil COVID-19 PCR negative blood culture x2 sent, UA cloudy with [place] more than 50 large LE, EKG NSR chest x-ray no acute finding CT renal studies showed no ureteral stone or any evidence of hydronephrosis, showing perivasical stranding consistent with cystitis,  EDP discussed patient's case with [facility] transplant team and w/ Dr. Corey Harold of [facility] nephrology and felt patient was okay to stay in the [facility] for further evaluation management of acute kidney injury, and specifically recommended continuation of IV fluids, renal ultrasound, and holding of outpatient mycophenolate for 3 to 5 days.    Subjective: Patient reports he feels much better this morning.  2 L bolus is completed from last night. ON Sodium bicarb INFUSION. Overnight patient is afebrile blood pressure stable.  On room air Labs with improving renal failure  Assessment and Plan: * Acute renal failure superimposed on stage 3b chronic kidney disease ([place])- (present on admission) Baseline creatinine 1.7-1.9 in May 2022 creatinine admission 3.5.  Imaging without obstruction UA suggest infection and CT suggest bladder cystitis.  No obvious hypotension no signs of transplant inflammation by CT.  AKI likely from volume depletion.  Nephrology on board appreciate input.  Continue with  aggressive IV fluid hydration as per nephrology.  Renal failure improving.  Monitor Recent Labs  Lab 06/19/21 2117 06/20/21 0500 06/21/21 0546  BUN 63* 61* 49*  CREATININE 3.54* 3.06* 5.99*    Metabolic acidosis Due to AKI.  IVF per nephrology  Anemia, chronic disease- (present on admission) In the setting of CKD.  Monitor hemoglobin.  HLD (hyperlipidemia)- (present on admission) Continue Lipitor  Hypertension- (present on admission) Blood pressure is stable continue patient's Procardia.  Generalized weakness In the setting of multiple comorbidities acute infection.  Supportive care PT OT  Kidney transplanted No signs of rejection nephro following.  Continue with prednisone, Prograf.  Mycophenolate on hold but resumed at there is no leukopenia.   Sepsis secondary to UTI [facility])- (present on admission) Met severe sepsis criteria on admission with abnormal UA, cystitis on CT, AKI.  Chest x-ray no acute finding.  Blood culture pending.  Urine culture with E. coli, final C/S pending. Continue current ceftriaxone. Recent Labs  Lab 06/19/21 2117 06/19/21 2120 06/20/21 0500 06/21/21 0546  WBC 7.2  --  6.2 6.5  LATICACIDVEN  --  1.7  --   --     DVT prophylaxis: SCDs Start: 06/20/21 0031 Code Status:   Code Status: Full Code Family Communication: plan of care discussed with patient at bedside.  Disposition: Currently not medically stable for discharge. Status is: Inpatient Remains inpatient appropriate because: For IV fluid hydration for AKI Objective: Vitals last 24 hrs: Vitals:   06/20/21 1706 06/20/21 1742 06/20/21 2154 06/21/21 0329  BP:  (!) 114/54 131/84 121/73  Pulse: 76 75 86 78  Resp: (!) 27 18 18 18   Temp: 98.7 F (37.1 C) 98.2 F (36.8 C)  98.9 F (37.2 C) 98.3 F (36.8 C)  TempSrc: Oral Oral Oral Oral  SpO2: 97% 96% 99% 95%  Weight:    92 kg  Height:       Weight change:   Physical Examination: General exam: AA0X3, pleasant HEENT:Oral mucosa moist,  Ear/Nose WNL grossly, dentition normal. Respiratory system: bilaterally diminished BS, no use of accessory muscle Cardiovascular system: S1 & S2 +, No JVD,. Gastrointestinal system: Abdomen soft,NT,ND, BS+ Nervous System:Alert, awake, moving extremities and grossly nonfocal Extremities: LE edema none,distal peripheral pulses palpable.  Skin: No rashes,no icterus. MSK: Normal muscle bulk,tone, power  Medications reviewed:  Scheduled Meds:  atorvastatin  80 mg Oral Daily   insulin aspart  0-9 Units Subcutaneous TID WC   insulin glargine-yfgn  10 Units Subcutaneous Daily   mycophenolate  720 mg Oral BID   NIFEdipine  60 mg Oral Daily   predniSONE  5 mg Oral Daily   tacrolimus  3 mg Oral QHS   tacrolimus  4 mg Oral Daily   Continuous Infusions:  cefTRIAXone (ROCEPHIN)  IV Stopped (06/20/21 2212)   sodium bicarbonate 150 mEq in D5W infusion 125 mL/hr at 06/21/21 0631      Diet Order             Diet regular Room service appropriate? Yes; Fluid consistency: Thin  Diet effective now                    Intake/Output Summary (Last 24 hours) at 06/21/2021 1113 Last data filed at 06/21/2021 0631 Gross per 24 hour  Intake 1627.12 ml  Output --  Net 1627.12 ml   Net IO Since Admission: 1,627.12 mL [06/21/21 1113]  Wt Readings from Last 3 Encounters:  06/21/21 92 kg  05/21/21 96.3 kg  11/18/20 86.5 kg     Unresulted Labs (From admission, onward)     Start     Ordered   06/22/21 5852  Basic metabolic panel  Daily,   R      06/21/21 7782          Data Reviewed: I have personally reviewed following labs and imaging studies CBC: Recent Labs  Lab 06/19/21 2117 06/20/21 0500 06/21/21 0546  WBC 7.2 6.2 6.5  NEUTROABS 6.3 5.5  --   HGB 13.0 11.7* 10.7*  HCT 41.7 37.1 34.3*  MCV 89.1 87.5 88.4  PLT 231 191 423   Basic Metabolic Panel: Recent Labs  Lab 06/19/21 2117 06/20/21 0100 06/20/21 0500 06/21/21 0546  NA 138  --  135 137  K 4.1  --  4.1 3.7  CL 109   --  113* 113*  CO2 17*  --  15* 16*  GLUCOSE 198*  --  156* 142*  BUN 63*  --  61* 49*  CREATININE 3.54*  --  3.06* 2.41*  CALCIUM 9.1  --  8.6* 8.4*  MG  --  2.3 1.9  --    GFR: Estimated Creatinine Clearance: 31.4 mL/min (A) (by C-G formula based on SCr of 2.41 mg/dL (H)). Liver Function Tests: Recent Labs  Lab 06/19/21 2117 06/20/21 0500  AST 34 30  ALT 27 24  ALKPHOS 75 61  BILITOT 0.3 0.2*  PROT 7.8 6.4*  ALBUMIN 3.4* 2.9*   No results for input(s): LIPASE, AMYLASE in the last 168 hours. No results for input(s): AMMONIA in the last 168 hours. Coagulation Profile: Recent Labs  Lab 06/19/21 2117  INR 1.1    Antimicrobials: Anti-infectives (From admission, onward)  Start     Dose/Rate Route Frequency Ordered Stop   06/19/21 2300  cefTRIAXone (ROCEPHIN) 2 g in sodium chloride 0.9 % 100 mL IVPB  Status:  Discontinued        2 g 200 mL/hr over 30 Minutes Intravenous Every 24 hours 06/19/21 2248 06/19/21 2249   06/19/21 2300  cefTRIAXone (ROCEPHIN) 1 g in sodium chloride 0.9 % 100 mL IVPB        1 g 200 mL/hr over 30 Minutes Intravenous Every 24 hours 06/19/21 2249        Culture/Microbiology    Component Value Date/Time   SDES  06/19/2021 2157    IN/OUT CATH URINE Performed at [facility], [place] [address], [place], [place] [address]    SPECREQUEST  06/19/2021 2157    NONE Performed at [facility], [place] [address] [place], [place] [address]    CULT (A) 06/19/2021 2157    >=100,000 COLONIES/mL ESCHERICHIA COLI SUSCEPTIBILITIES TO FOLLOW Performed at [facility] [address] [place], [place] [address]    REPTSTATUS PENDING 06/19/2021 2157    Other culture-see note  Radiology Studies: DG Chest 2 View  Result Date: 06/19/2021 CLINICAL DATA:  Sepsis EXAM: CHEST - 2 VIEW COMPARISON:  CT chest 10/13/2020, 03/29/2020 FINDINGS: The heart size and mediastinal contours are within normal limits. Aortic  atherosclerosis. Both lungs are clear. The visualized skeletal structures are unremarkable. IMPRESSION: No active cardiopulmonary disease. Electronically Signed   By: Donavan Foil M.D.   On: 06/19/2021 21:26   US Renal Transplant w/Doppler  Result Date: 06/20/2021 CLINICAL DATA:  Acute renal insufficiency. Right lower quadrant renal transplant. EXAM: ULTRASOUND OF RENAL TRANSPLANT WITH RENAL DOPPLER ULTRASOUND TECHNIQUE: Ultrasound examination of the renal transplant was performed with gray-scale, color and duplex doppler evaluation. COMPARISON:  Ultrasound dated 05/01/2020. FINDINGS: Transplant kidney location: Right lower quadrant Transplant Kidney: Renal measurements: 8.4 x 3.4 x 4.3 cm = volume: 81m. Normal echogenicity. No hydronephrosis or shadowing stone. There is a 1.3 cm cyst. No peritransplant fluid collection. Color flow in the main renal artery:  Yes Color flow in the main renal vein:  Yes Duplex Doppler Evaluation: Main Renal Artery Velocity: 104 cm/sec Main Renal Artery Resistive Index: 0.8 Venous waveform in main renal vein:  Present Intrarenal resistive index in upper pole:  0.6 (normal 0.6-0.8; equivocal 0.8-0.9; abnormal >= 0.9) Intrarenal resistive index in lower pole: 0.6 (normal 0.6-0.8; equivocal 0.8-0.9; abnormal >= 0.9) Bladder: Normal for degree of bladder distention. Other findings:  None. IMPRESSION: 1. Small cyst in the transplant kidney, otherwise unremarkable ultrasound. 2. No hydronephrosis or peritransplant collection. 3. Patent main renal artery and vein with normal range intrarenal resistive indices. Electronically Signed   By: AAnner CreteM.D.   On: 06/20/2021 01:08   CT Renal Stone Study  Result Date: 06/19/2021 CLINICAL DATA:  Flank pain.  Kidney stone suspected. EXAM: CT ABDOMEN AND PELVIS WITHOUT CONTRAST TECHNIQUE: Multidetector CT imaging of the abdomen and pelvis was performed following the standard protocol without IV contrast. RADIATION DOSE REDUCTION: This  exam was performed according to the departmental dose-optimization program which includes automated exposure control, adjustment of the mA and/or kV according to patient size and/or use of iterative reconstruction technique. COMPARISON:  CTs abdomen and pelvis without contrast 08/27/2019, 05/22/2018. more recently a chest CT without contrast was performed 10/13/2020 FINDINGS: Lower chest: Lung bases are clear. The cardiac size is normal. There is a small anterior pericardial effusion not seen previously. Calcification  in the mitral and aortic valve planes is again noted, and three-vessel coronary artery calcific disease. Small hiatal hernia. Hepatobiliary: 15 cm length slightly steatotic liver. No focal abnormality is seen without contrast. There are scattered calcifications of the hepatic arterial main and branch arteries at the liver hilum. No calcified gallstones, gallbladder thickening or biliary dilatation are observed. Pancreas: Unremarkable without contrast. Spleen: Unremarkable without contrast , normal in size. Adrenals/Urinary Tract: There is no adrenal mass. Both kidneys have undergone moderate cortical thinning and volume loss over the prior studies. Both kidneys measure 7.8 cm in length with renovascular calcifications. There is a small cyst in the posterosuperior right kidney with no other focal cortical abnormality visible without contrast. There are no collecting system stones or hydroureteronephrosis. There is mild thickening of the bladder versus nondistention, equivocal perivesical haziness which may suggest cystitis. There is transplant kidney in the right iliac fossa new from prior studies, which shows no stones or hydronephrosis but does show evidence of periureteral stranding , nonspecific and could be related to scarring with the renal transplant or inflammatory process. The transplant renal cortex is normal in thickness. Stomach/Bowel: Small hiatal hernia. No wall thickening or dilatation  there is seen including the appendix. There is fluid in the ascending and transverse colon without wall thickening. Scattered sigmoid diverticula without diverticulitis. Vascular/Lymphatic: There is extensive aortic, iliac and branch vessel atherosclerosis without AAA. No adenopathy is seen. Reproductive: The uterus is intact. There are heavy vascular calcifications in its outer wall, extending to lower pelvic sidewalls the uterus and ovaries are not enlarged. Other: In the right upper abdomen anterior to the left lobe of liver there is a partially fatty mass not seen on prior studies of the abdomen and pelvis but was seen on last year's chest CT, with a few punctate calcifications inferiorly, irregular shape and measuring 3.1 x 0.9 by 2.4 cm. Smaller similar lesion is seen just inferior to this. These are most likely omental infarcts or other benign fat necrosis. Partially calcified lymph nodes or possible as well. There is no free air, hemorrhage or fluid. Musculoskeletal: No worrisome regional skeletal lesions. Mild facet hypertrophy lumbar spine. Small umbilical fat hernia. IMPRESSION: 1. No evidence of urinary stones or obstruction including of the transplanted kidney in the right iliac fossa, new from prior abdomen pelvis CTs. 2. The bladder is contracted but it is possible the patient could have cystitis, with slight perivesical stranding noted. Correlation with urinalysis is recommended. 3. There is stranding around the transplant ureter, which could be postsurgical scarring or inflammatory process. There is no hydronephrosis. 4. Extensive aortoiliac and branch vessel atherosclerosis. Calcification in the mitral and aortic valves. 5. Significant interval renal atrophy. 6. Small hiatal hernia. Small umbilical fat hernia. No bowel obstruction, incarceration or inflammation. 7. Fluid in the colon. 8. Small pericardial effusion. 9. Foci of likely fat necrosis in the right upper quadrant. Probable old omental  infarcts new since prior abdomen pelvis CTs. Electronically Signed   By: Telford Nab M.D.   On: 06/19/2021 23:37     LOS: 1 day   Antonieta Pert, MD [facility]  06/21/2021, 11:13 AM

## 2021-06-21 NOTE — Progress Notes (Addendum)
[facility] Progress Note  Subjective: no UOP recorded, labs better w/ creat down to   Vitals:   06/20/21 1706 06/20/21 1742 06/20/21 2154 06/21/21 0329  BP:  (!) 114/54 131/84 121/73  Pulse: 76 75 86 78  Resp: (!) 27 18 18 18   Temp: 98.7 F (37.1 C) 98.2 F (36.8 C) 98.9 F (37.2 C) 98.3 F (36.8 C)  TempSrc: Oral Oral Oral Oral  SpO2: 97% 96% 99% 95%  Weight:      Height:        Exam: Gen alert, no distress Throat slightly dry   No jvd or bruits, flat neck veins Chest clear bilat to bases RRR no MRG Abd soft ntnd no mass or ascites +bs Ext no edema Neuro is alert, Ox 3 , nf      Date                          Creat               eGFR   2014- 2016                2.50- 2.75   2017                          3.34   2020                          5.06   April -June 2021        8.10- 8.83        Transplant in 2021   Feb 2022                   1.73                 38                       July 2022                   1.90                 34   Feb 10                       3.54                 15   Jun 20, 2021             3.06                 17 ml/min      Home meds include - lipitor, lantus / humalog insulin, myfortic 720 bid, procardia xl 60 qd, ozempic 0.5 mg weekly, pred 5 mg qd, prograf 66m am and 3 mg pm, bactrium 400-80 1 tab mwf, prns/ vits/ supps     UA 2/10 - cloudy, amber, mod Hb, large LE, neg nitrite, prot 100, many bact 21-50 rbc, >50 wbc w/ clumps    UCx, BCXs pending     [place] renal [place]- IMPRESSION: 1. Small cyst in the transplant kidney, otherwise unremarkable ultrasound. 2. No hydronephrosis or peritransplant collection. 3. Patent main renal artery and vein with normal range intrarenal resistive indices.     Abd CT renal stone study 2/10 - IMPRESSION: 1. No evidence of urinary stones or obstruction  including of the transplanted kidney in the right iliac fossa, new from prior abdomen pelvis CTs. 2. The bladder is contracted but it is  possible the patient could have cystitis, with slight perivesical stranding noted. Correlation with urinalysis is recommended. 3. There is stranding around the transplant ureter, which could be postsurgical scarring or inflammatory process. There is no hydronephrosis. 4. Extensive aortoiliac and branch vessel atherosclerosis. Calcification in the mitral and aortic valves. 5. Significant interval renal atrophy. 6. Small hiatal hernia. Small umbilical fat hernia. No bowel obstruction, incarceration or inflammation. 7. Fluid in the colon .8. Small pericardial effusion. 9. Foci of likely fat necrosis in the right upper quadrant. Probable old omental infarcts new since prior abdomen pelvis CTs.      B/l creat 1.73- 1.90 in mid 2022, eGFR 34- 38 ml/min, CKD 3b transplant.     UNa, UCr pending       Assessment/ Plan: AKI on CKD 3b transplant - B/l creat 1.73- 1.90 in mid 2022, eGFR 34- 38 ml/min. Creat here 3.54 on admit 2/10 in setting of body aches, poor po intake for 2 wks and probable UTI.  Imaging showed no obstruction. UA suggested infection and CT suggested bladder infection/ cystitis. No hypotension or signs of transplant inflammation by CT. AKI suspected due to vol depletion primarily and less likely transplant pyelo. Ordered another 2 L bolus. Creat down today again to 2.41, improving significantly. Lower IVF's to 85 cc/hr. Labs in am.  SP renal transplant - Nov 2021, on prograf, pred, mycophenolate. Holding mycophenolate x 3-5 days w/ acute infection per rec's from [facility] team on admission.  Sepsis - suspected UTI. No hypotension/ shock. Getting IV abx empirically.  Generalized weakness - dehydration + infection, improving. Feels much better.          Rob Jurline Folger 06/21/2021, 6:35 AM   Recent Labs  Lab 06/19/21 2117 06/20/21 0500 06/21/21 0546  K 4.1 4.1 3.7  BUN 63* 61* 49*  CREATININE 3.54* 3.06* 2.41*  ALBUMIN 3.4* 2.9*  --   CALCIUM 9.1 8.6* 8.4*  HGB 13.0 11.7* 10.7*    Inpatient medications:  atorvastatin  80 mg Oral Daily   insulin aspart  0-9 Units Subcutaneous TID WC   insulin glargine-yfgn  10 Units Subcutaneous Daily   mycophenolate  720 mg Oral BID   NIFEdipine  60 mg Oral Daily   predniSONE  5 mg Oral Daily   tacrolimus  3 mg Oral QHS   tacrolimus  4 mg Oral Daily    cefTRIAXone (ROCEPHIN)  IV Stopped (06/20/21 2212)   sodium bicarbonate 150 mEq in D5W infusion 125 mL/hr at 06/21/21 0631   acetaminophen **OR** acetaminophen

## 2021-06-21 NOTE — Assessment & Plan Note (Addendum)
Met severe sepsis criteria on admission with abnormal UA, cystitis on CT, AKI.  Chest x-ray no acute finding.  Blood culture pending.  Urine culture with E. coli, final C/S resulted will discharge on p.o. Omnicef based on culture sensitivity

## 2021-06-21 NOTE — Progress Notes (Signed)
OT Cancellation Note  Patient Details Name: Samantha [name] MRN: [id number] DOB: 04-Nov-1965   Cancelled Treatment:    Reason Eval/Treat Not Completed: OT screened, no needs identified, will sign off. Independent with ADLs and reports no OT needs.  Keiva Dina L Latresha Yahr 06/21/2021, 9:58 AM

## 2021-06-21 NOTE — Assessment & Plan Note (Addendum)
In the setting of multiple comorbidities acute infection.  Improved

## 2021-06-21 NOTE — Assessment & Plan Note (Signed)
Blood pressure is stable continue patient's Procardia.

## 2021-06-21 NOTE — Assessment & Plan Note (Signed)
-  Continue Lipitor °

## 2021-06-21 NOTE — Assessment & Plan Note (Addendum)
No signs of rejection nephro following.  Continue with prednisone, Prograf.  Mycophenolate can be resumed upon discharge-she will confirm with [facility] transplant team

## 2021-06-21 NOTE — Assessment & Plan Note (Addendum)
Baseline creatinine 1.7-1.9 in May 2022 creatinine admission 3.5.  Imaging without obstruction UA suggest infection and CT suggest bladder cystitis.  No obvious hypotension no signs of transplant inflammation by CT.  AKI likely from volume depletion.  Nephrology on board appreciate input.  With IV fluids AKI resolved.  Tolerating diet.  Discussion nephrology Dr. Candiss Norse this morning okay for discharge home. Recent Labs  Lab 06/19/21 2117 06/20/21 0500 06/21/21 0546 06/22/21 0457  BUN 63* 61* 49* 32*  CREATININE 3.54* 3.06* 2.41* 1.50*

## 2021-06-21 NOTE — Assessment & Plan Note (Signed)
In the setting of CKD.  Monitor hemoglobin.

## 2021-06-22 DIAGNOSIS — N1832 Chronic kidney disease, stage 3b: Secondary | ICD-10-CM | POA: Diagnosis not present

## 2021-06-22 DIAGNOSIS — N179 Acute kidney failure, unspecified: Secondary | ICD-10-CM | POA: Diagnosis not present

## 2021-06-22 LAB — BASIC METABOLIC PANEL
Anion gap: 8 (ref 5–15)
BUN: 32 mg/dL — ABNORMAL HIGH (ref 6–20)
CO2: 22 mmol/L (ref 22–32)
Calcium: 8 mg/dL — ABNORMAL LOW (ref 8.9–10.3)
Chloride: 109 mmol/L (ref 98–111)
Creatinine, Ser: 1.5 mg/dL — ABNORMAL HIGH (ref 0.44–1.00)
GFR, Estimated: 41 mL/min — ABNORMAL LOW (ref >60)
Glucose, Bld: 199 mg/dL — ABNORMAL HIGH (ref 70–99)
Potassium: 3 mmol/L — ABNORMAL LOW (ref 3.5–5.1)
Sodium: 139 mmol/L (ref 135–145)

## 2021-06-22 LAB — URINE CULTURE: Culture: >=100000 — AB

## 2021-06-22 LAB — GLUCOSE, CAPILLARY: Glucose-Capillary: 162 mg/dL — ABNORMAL HIGH (ref 70–99)

## 2021-06-22 MED ORDER — POTASSIUM CHLORIDE CRYS ER 20 MEQ PO TBCR: 20.0000 meq | EXTENDED_RELEASE_TABLET | Freq: Every day | ORAL | 0 refills | Status: DC

## 2021-06-22 MED ORDER — CEFDINIR 300 MG PO CAPS: 300.0000 mg | ORAL_CAPSULE | Freq: Two times a day (BID) | ORAL | 0 refills | Status: AC

## 2021-06-22 MED ORDER — POTASSIUM CHLORIDE CRYS ER 20 MEQ PO TBCR
40.0000 meq | EXTENDED_RELEASE_TABLET | Freq: Once | ORAL | Status: AC
  Administered 2021-06-22: 40 meq via ORAL
  Filled 2021-06-22: qty 2

## 2021-06-22 NOTE — Plan of Care (Signed)

## 2021-06-22 NOTE — Discharge Summary (Signed)
Physician Discharge Summary   Patient: Samantha [name] MRN: [id number] DOB: 1966/04/19  Admit date:     06/19/2021  Discharge date: 06/22/21  Discharge Physician: Antonieta Pert   PCP: Glendale Chard, MD   Recommendations at discharge:   PCP, transplant team BMP in 1 week  Hospital Course: 47 yof w/ hx of renal transplant in Nov 21,subsequently on chronic immunosuppressive therapy including chronic prednisone therapy, T2DM,HTN,HLD presenting with 1 to 2 days of fever up to 102 at home with generalized weakness. ED Course: Febrile 102.7; heart rate 93-1 02; blood pressure 114/63 - 143/80 mmHg; respiratory rate 15-23, oxygen saturation 95 to 100% on room air. Labs with metabolic acidosis bicarb 17 creatinine 3.5, lactic acid 1.7 WBC stable 7.2K 88% neutrophil COVID-19 PCR negative blood culture x2 sent, UA cloudy with Dow[place] more than 50 large LE, EKG NSR chest x-ray no acute finding CT renal studies showed no ureteral stone or any evidence of hydronephrosis, showing perivasical stranding consistent with cystitis,  EDP discussed patient's case with [facility] transplant team and w/ Dr. Corey Harold of [facility] nephrology and felt patient was okay to stay in the [facility] health system for further evaluation management of acute kidney injury, and specifically recommended continuation of IV fluids, renal ultrasound, and holding of outpatient mycophenolate for 3 to 5 days. Patient is seen by nephrology continue on aggressive IV fluid hydration AKI has resolved.  Generalized weakness resolved.  Potassium on lower side was repleted.  She is to call her transplant team and follow-up with them, she may resume her CellCept at home At this time she is being discharged home.  Discharge Diagnoses: Principal Problem:   Acute renal failure superimposed on stage 3b chronic kidney disease (HCC) Active Problems:   Metabolic acidosis   Sepsis secondary to UTI ([place])   Acute cystitis   Kidney transplanted    Generalized weakness   Hypertension   HLD (hyperlipidemia)   Anemia, chronic disease  SEE BELOW  * Acute renal failure superimposed on stage 3b chronic kidney disease ([place])- (present on admission) Baseline creatinine 1.7-1.9 in May 2022 creatinine admission 3.5.  Imaging without obstruction UA suggest infection and CT suggest bladder cystitis.  No obvious hypotension no signs of transplant inflammation by CT.  AKI likely from volume depletion.  Nephrology on board appreciate input.  With IV fluids AKI resolved.  Tolerating diet.  Discussion nephrology Dr. Candiss Norse this morning okay for discharge home. Recent Labs  Lab 06/19/21 2117 06/20/21 0500 06/21/21 0546 06/22/21 0457  BUN 63* 61* 49* 32*  CREATININE 3.54* 3.06* 2.41* 9.82*    Metabolic acidosis Resolved.  She will continue her home bicarb  Anemia, chronic disease- (present on admission) In the setting of CKD.  Monitor hemoglobin.  HLD (hyperlipidemia)- (present on admission) Continue Lipitor  Hypertension- (present on admission) Blood pressure is stable continue patient's Procardia.  Generalized weakness In the setting of multiple comorbidities acute infection.  Improved  Kidney transplanted No signs of rejection nephro following.  Continue with prednisone, Prograf.  Mycophenolate can be resumed upon discharge-she will confirm with [facility] transplant team   Sepsis secondary to UTI [facility])- (present on admission) Met severe sepsis criteria on admission with abnormal UA, cystitis on CT, AKI.  Chest x-ray no acute finding.  Blood culture pending.  Urine culture with E. coli, final C/S resulted will discharge on p.o. Omnicef based on culture sensitivity         Consultants: Nephrology Procedures performed: None Disposition: Home Diet recommendation:  Diet Orders (From admission, onward)     Start     Ordered   06/20/21 0032  Diet regular Room service appropriate? Yes; Fluid consistency: Thin  Diet effective now        Question Answer Comment  Room service appropriate? Yes   Fluid consistency: Thin      06/20/21 0031            DISCHARGE MEDICATION: Allergies as of 06/22/2021   No Known Allergies      Medication List     TAKE these medications    atorvastatin 80 MG tablet Commonly known as: LIPITOR Take 80 mg by mouth daily.   cefdinir 300 MG capsule Commonly known as: OMNICEF Take 1 capsule (300 mg total) by mouth 2 (two) times daily for 5 days.   cyclobenzaprine 5 MG tablet Commonly known as: FLEXERIL One tab po qpm prn back/hip pain, take no later than 8pm What changed:  how much to take how to take this when to take this reasons to take this additional instructions   insulin lispro 100 UNIT/ML KwikPen Commonly known as: HumaLOG KwikPen Per sliding scale through [facility] transplant team What changed: how much to take   Lantus SoloStar 100 UNIT/ML Solostar Pen Generic drug: insulin glargine Inject 20 Units into the skin daily.   meclizine 12.5 MG tablet Commonly known as: ANTIVERT Take 1 tablet (12.5 mg total) by mouth 3 (three) times daily as needed for dizziness.   mycophenolate 180 MG EC tablet Commonly known as: MYFORTIC Take 720 mg by mouth 2 (two) times daily.   NIFEdipine 60 MG 24 hr tablet Commonly known as: PROCARDIA XL/NIFEDICAL XL Take 60 mg by mouth daily.   Ozempic (0.25 or 0.5 MG/DOSE) 2 MG/1.5ML Sopn Generic drug: Semaglutide(0.25 or 0.5MG/DOS) Inject 0.5 mg into the skin once a week.   potassium chloride SA 20 MEQ tablet Commonly known as: KLOR-CON M Take 1 tablet (20 mEq total) by mouth daily at 6 (six) AM for 2 doses.   predniSONE 5 MG tablet Commonly known as: DELTASONE Take 5 mg by mouth daily.   sodium bicarbonate 650 MG tablet Take 1,300 mg by mouth 3 (three) times daily.   sulfamethoxazole-trimethoprim 400-80 MG tablet Commonly known as: BACTRIM Take 1 tablet by mouth 3 (three) times a week.   tacrolimus 1 MG  capsule Commonly known as: PROGRAF Take 3-4 mg by mouth See admin instructions. 80m in the morning, 358min the evening        Follow-up Information     SaGlendale ChardMD Follow up in 1 week(s).   Specialty: Internal Medicine Contact information: [address] [phone]       [facility]                  Discharge Exam: Filed Weights   06/20/21 1428 06/21/21 0329 06/22/21 0525  Weight: 95.3 kg 92 kg 89.5 kg   Condition at discharge: good  The results of significant diagnostics from this hospitalization (including imaging, microbiology, ancillary and laboratory) are listed below for reference.   Imaging Studies: DG Chest 2 View  Result Date: 06/19/2021 CLINICAL DATA:  Sepsis EXAM: CHEST - 2 VIEW COMPARISON:  CT chest 10/13/2020, 03/29/2020 FINDINGS: The heart size and mediastinal contours are within normal limits. Aortic atherosclerosis. Both lungs are clear. The visualized skeletal structures are unremarkable. IMPRESSION: No active cardiopulmonary disease. Electronically Signed  By: Donavan Foil M.D.   On: 06/19/2021 21:26   US Renal Transplant w/Doppler  Result Date: 06/20/2021 CLINICAL DATA:  Acute renal insufficiency. Right lower quadrant renal transplant. EXAM: ULTRASOUND OF RENAL TRANSPLANT WITH RENAL DOPPLER ULTRASOUND TECHNIQUE: Ultrasound examination of the renal transplant was performed with gray-scale, color and duplex doppler evaluation. COMPARISON:  Ultrasound dated 05/01/2020. FINDINGS: Transplant kidney location: Right lower quadrant Transplant Kidney: Renal measurements: 8.4 x 3.4 x 4.3 cm = volume: 54m. Normal echogenicity. No hydronephrosis or shadowing stone. There is a 1.3 cm cyst. No peritransplant fluid collection. Color flow in the main renal artery:  Yes Color flow in the main renal vein:  Yes Duplex Doppler Evaluation: Main Renal Artery Velocity: 104 cm/sec Main  Renal Artery Resistive Index: 0.8 Venous waveform in main renal vein:  Present Intrarenal resistive index in upper pole:  0.6 (normal 0.6-0.8; equivocal 0.8-0.9; abnormal >= 0.9) Intrarenal resistive index in lower pole: 0.6 (normal 0.6-0.8; equivocal 0.8-0.9; abnormal >= 0.9) Bladder: Normal for degree of bladder distention. Other findings:  None. IMPRESSION: 1. Small cyst in the transplant kidney, otherwise unremarkable ultrasound. 2. No hydronephrosis or peritransplant collection. 3. Patent main renal artery and vein with normal range intrarenal resistive indices. Electronically Signed   By: AAnner CreteM.D.   On: 06/20/2021 01:08   CT Renal Stone Study  Result Date: 06/19/2021 CLINICAL DATA:  Flank pain.  Kidney stone suspected. EXAM: CT ABDOMEN AND PELVIS WITHOUT CONTRAST TECHNIQUE: Multidetector CT imaging of the abdomen and pelvis was performed following the standard protocol without IV contrast. RADIATION DOSE REDUCTION: This exam was performed according to the departmental dose-optimization program which includes automated exposure control, adjustment of the mA and/or kV according to patient size and/or use of iterative reconstruction technique. COMPARISON:  CTs abdomen and pelvis without contrast 08/27/2019, 05/22/2018. more recently a chest CT without contrast was performed 10/13/2020 FINDINGS: Lower chest: Lung bases are clear. The cardiac size is normal. There is a small anterior pericardial effusion not seen previously. Calcification in the mitral and aortic valve planes is again noted, and three-vessel coronary artery calcific disease. Small hiatal hernia. Hepatobiliary: 15 cm length slightly steatotic liver. No focal abnormality is seen without contrast. There are scattered calcifications of the hepatic arterial main and branch arteries at the liver hilum. No calcified gallstones, gallbladder thickening or biliary dilatation are observed. Pancreas: Unremarkable without contrast. Spleen:  Unremarkable without contrast , normal in size. Adrenals/Urinary Tract: There is no adrenal mass. Both kidneys have undergone moderate cortical thinning and volume loss over the prior studies. Both kidneys measure 7.8 cm in length with renovascular calcifications. There is a small cyst in the posterosuperior right kidney with no other focal cortical abnormality visible without contrast. There are no collecting system stones or hydroureteronephrosis. There is mild thickening of the bladder versus nondistention, equivocal perivesical haziness which may suggest cystitis. There is transplant kidney in the right iliac fossa new from prior studies, which shows no stones or hydronephrosis but does show evidence of periureteral stranding , nonspecific and could be related to scarring with the renal transplant or inflammatory process. The transplant renal cortex is normal in thickness. Stomach/Bowel: Small hiatal hernia. No wall thickening or dilatation there is seen including the appendix. There is fluid in the ascending and transverse colon without wall thickening. Scattered sigmoid diverticula without diverticulitis. Vascular/Lymphatic: There is extensive aortic, iliac and branch vessel atherosclerosis without AAA. No adenopathy is seen. Reproductive: The uterus is intact. There are heavy vascular calcifications in its  outer wall, extending to lower pelvic sidewalls the uterus and ovaries are not enlarged. Other: In the right upper abdomen anterior to the left lobe of liver there is a partially fatty mass not seen on prior studies of the abdomen and pelvis but was seen on last year's chest CT, with a few punctate calcifications inferiorly, irregular shape and measuring 3.1 x 0.9 by 2.4 cm. Smaller similar lesion is seen just inferior to this. These are most likely omental infarcts or other benign fat necrosis. Partially calcified lymph nodes or possible as well. There is no free air, hemorrhage or fluid. Musculoskeletal:  No worrisome regional skeletal lesions. Mild facet hypertrophy lumbar spine. Small umbilical fat hernia. IMPRESSION: 1. No evidence of urinary stones or obstruction including of the transplanted kidney in the right iliac fossa, new from prior abdomen pelvis CTs. 2. The bladder is contracted but it is possible the patient could have cystitis, with slight perivesical stranding noted. Correlation with urinalysis is recommended. 3. There is stranding around the transplant ureter, which could be postsurgical scarring or inflammatory process. There is no hydronephrosis. 4. Extensive aortoiliac and branch vessel atherosclerosis. Calcification in the mitral and aortic valves. 5. Significant interval renal atrophy. 6. Small hiatal hernia. Small umbilical fat hernia. No bowel obstruction, incarceration or inflammation. 7. Fluid in the colon. 8. Small pericardial effusion. 9. Foci of likely fat necrosis in the right upper quadrant. Probable old omental infarcts new since prior abdomen pelvis CTs. Electronically Signed   By: Telford Nab M.D.   On: 06/19/2021 23:37    Microbiology: Results for orders placed or performed during the hospital encounter of 06/19/21  Blood Culture (routine x 2)     Status: None (Preliminary result)   Collection Time: 06/19/21  9:20 PM   Specimen: BLOOD  Result Value Ref Range Status   Specimen Description   Final    BLOOD RIGHT ANTECUBITAL Performed at [facility] [address] [place], [place] [address]    Special Requests   Final    BOTTLES DRAWN AEROBIC AND ANAEROBIC Blood Culture results may not be optimal due to an excessive volume of blood received in culture bottles Performed at [facility] [address], [place], [place] [address]    Culture   Final    NO GROWTH 2 DAYS Performed at [facility] [address], [place], [place] [address]    Report Status PENDING  Incomplete  Blood Culture (routine x 2)     Status: None  (Preliminary result)   Collection Time: 06/19/21  9:20 PM   Specimen: BLOOD  Result Value Ref Range Status   Specimen Description   Final    BLOOD LEFT ANTECUBITAL Performed at [facility] [address], [place], [place] [address]    Special Requests   Final    BOTTLES DRAWN AEROBIC AND ANAEROBIC Blood Culture adequate volume Performed at [facility] [address] [place], [place] [address]    Culture   Final    NO GROWTH 2 DAYS Performed at [facility] [address], [place],  [address]    Report Status PENDING  Incomplete  Resp Panel by RT-PCR (Flu A&B, Covid) Nasopharyngeal Swab     Status: None   Collection Time: 06/19/21  9:20 PM   Specimen: Nasopharyngeal Swab; Nasopharyngeal(NP) swabs in vial transport medium  Result Value Ref Range Status   SARS Coronavirus 2 by RT PCR NEGATIVE NEGATIVE Final  Comment: (NOTE) SARS-CoV-2 target nucleic acids are NOT DETECTED.  The SARS-CoV-2 RNA is generally detectable in upper respiratory specimens during the acute phase of infection. The lowest concentration of SARS-CoV-2 viral copies this assay can detect is 138 copies/mL. A negative result does not preclude SARS-Cov-2 infection and should not be used as the sole basis for treatment or other patient management decisions. A negative result may occur with  improper specimen collection/handling, submission of specimen other than nasopharyngeal swab, presence of viral mutation(s) within the areas targeted by this assay, and inadequate number of viral copies(<138 copies/mL). A negative result must be combined with clinical observations, patient history, and epidemiological information. The expected result is Negative.  Fact Sheet for Patients:  EntrepreneurPulse.com.au  Fact Sheet for Healthcare Providers:  IncredibleEmployment.be  This test is no t yet approved or cleared by the [place] FDA and  has been authorized for detection and/or diagnosis of SARS-CoV-2 by FDA under an Emergency Use Authorization (EUA). This EUA will remain  in effect (meaning this test can be used) for the duration of the COVID-19 declaration under Section 564(b)(1) of the Act, 21 U.S.C.section 360bbb-3(b)(1), unless the authorization is terminated  or revoked sooner.       Influenza A by PCR NEGATIVE NEGATIVE Final   Influenza B by PCR NEGATIVE NEGATIVE Final    Comment: (NOTE) The Xpert Xpress SARS-CoV-2/FLU/RSV plus assay is intended as an aid in the diagnosis of influenza from Nasopharyngeal swab specimens and should not be used as a sole basis for treatment. Nasal washings and aspirates are unacceptable for Xpert Xpress SARS-CoV-2/FLU/RSV testing.  Fact Sheet for Patients: EntrepreneurPulse.com.au  Fact Sheet for Healthcare Providers: IncredibleEmployment.be  This test is not yet approved or cleared by the [place] FDA and has been authorized for detection and/or diagnosis of SARS-CoV-2 by FDA under an Emergency Use Authorization (EUA). This EUA will remain in effect (meaning this test can be used) for the duration of the COVID-19 declaration under Section 564(b)(1) of the Act, 21 U.S.C. section 360bbb-3(b)(1), unless the authorization is terminated or revoked.  Performed at [facility], [place] [address], [place], [place] [address]   Urine Culture     Status: Abnormal   Collection Time: 06/19/21  9:57 PM   Specimen: In/Out Cath Urine  Result Value Ref Range Status   Specimen Description   Final    IN/OUT CATH URINE Performed at [facility] [address] [place], [place] [address]    Special Requests   Final    NONE Performed at [facility], [place] [address], [place], [place] [address]    Culture >=100,000 COLONIES/mL ESCHERICHIA COLI (A)  Final   Report Status  06/22/2021 FINAL  Final   Organism ID, Bacteria ESCHERICHIA COLI (A)  Final      Susceptibility   Escherichia coli - MIC*    AMPICILLIN >=32 RESISTANT Resistant     CEFAZOLIN >=64 RESISTANT Resistant     CEFEPIME <=0.12 SENSITIVE Sensitive     CEFTRIAXONE <=0.25 SENSITIVE Sensitive     CIPROFLOXACIN <=0.25 SENSITIVE Sensitive     GENTAMICIN <=1 SENSITIVE Sensitive     IMIPENEM <=0.25 SENSITIVE Sensitive     NITROFURANTOIN <=16 SENSITIVE Sensitive     TRIMETH/SULFA >=320 RESISTANT Resistant     AMPICILLIN/SULBACTAM >=32 RESISTANT Resistant     PIP/TAZO >=128 RESISTANT Resistant     * >=100,000 COLONIES/mL ESCHERICHIA COLI    Labs: CBC: Recent Labs  Lab 06/19/21 2117  06/20/21 0500 06/21/21 0546  WBC 7.2 6.2 6.5  NEUTROABS 6.3 5.5  --   HGB 13.0 11.7* 10.7*  HCT 41.7 37.1 34.3*  MCV 89.1 87.5 88.4  PLT 231 191 646   Basic Metabolic Panel: Recent Labs  Lab 06/19/21 2117 06/20/21 0100 06/20/21 0500 06/21/21 0546 06/22/21 0457  NA 138  --  135 137 139  K 4.1  --  4.1 3.7 3.0*  CL 109  --  113* 113* 109  CO2 17*  --  15* 16* 22  GLUCOSE 198*  --  156* 142* 199*  BUN 63*  --  61* 49* 32*  CREATININE 3.54*  --  3.06* 2.41* 1.50*  CALCIUM 9.1  --  8.6* 8.4* 8.0*  MG  --  2.3 1.9  --   --    Liver Function Tests: Recent Labs  Lab 06/19/21 2117 06/20/21 0500  AST 34 30  ALT 27 24  ALKPHOS 75 61  BILITOT 0.3 0.2*  PROT 7.8 6.4*  ALBUMIN 3.4* 2.9*   CBG: Recent Labs  Lab 06/21/21 0804 06/21/21 1232 06/21/21 1716 06/21/21 2029 06/22/21 0740  GLUCAP 135* 209* 240* 260* 162*    Discharge time spent: 55mnutes.  Signed: RAntonieta Pert MD [facility] 06/22/2021

## 2021-06-24 ENCOUNTER — Telehealth: Payer: Self-pay

## 2021-06-24 DIAGNOSIS — Z79621 Long term (current) use of calcineurin inhibitor: Secondary | ICD-10-CM | POA: Diagnosis not present

## 2021-06-24 DIAGNOSIS — E785 Hyperlipidemia, unspecified: Secondary | ICD-10-CM | POA: Diagnosis not present

## 2021-06-24 DIAGNOSIS — R42 Dizziness and giddiness: Secondary | ICD-10-CM | POA: Diagnosis not present

## 2021-06-24 DIAGNOSIS — R911 Solitary pulmonary nodule: Secondary | ICD-10-CM | POA: Diagnosis not present

## 2021-06-24 DIAGNOSIS — E119 Type 2 diabetes mellitus without complications: Secondary | ICD-10-CM | POA: Diagnosis not present

## 2021-06-24 DIAGNOSIS — Z940 Kidney transplant status: Secondary | ICD-10-CM | POA: Diagnosis not present

## 2021-06-24 DIAGNOSIS — I10 Essential (primary) hypertension: Secondary | ICD-10-CM | POA: Diagnosis not present

## 2021-06-24 DIAGNOSIS — Z794 Long term (current) use of insulin: Secondary | ICD-10-CM | POA: Diagnosis not present

## 2021-06-24 DIAGNOSIS — D649 Anemia, unspecified: Secondary | ICD-10-CM | POA: Diagnosis not present

## 2021-06-24 DIAGNOSIS — D8989 Other specified disorders involving the immune mechanism, not elsewhere classified: Secondary | ICD-10-CM | POA: Diagnosis not present

## 2021-06-24 DIAGNOSIS — I951 Orthostatic hypotension: Secondary | ICD-10-CM | POA: Diagnosis not present

## 2021-06-24 DIAGNOSIS — Z792 Long term (current) use of antibiotics: Secondary | ICD-10-CM | POA: Diagnosis not present

## 2021-06-24 NOTE — Telephone Encounter (Signed)
Transition Care Management Unsuccessful Follow-up Telephone Call  Date of discharge and from where:  06/22/2021-   Attempts:  1st Attempt  Reason for unsuccessful TCM follow-up call:  Unable to leave message

## 2021-06-25 LAB — CULTURE, BLOOD (ROUTINE X 2)
Culture: NO GROWTH
Culture: NO GROWTH
Special Requests: ADEQUATE

## 2021-06-25 NOTE — Telephone Encounter (Signed)
Transition Care Management Unsuccessful Follow-up Telephone Call  Date of discharge and from where:  06/22/2021-[place]   Attempts:  2nd Attempt  Reason for unsuccessful TCM follow-up call:  Unable to leave message

## 2021-06-26 NOTE — Telephone Encounter (Signed)
Transition Care Management Unsuccessful Follow-up Telephone Call  Date of discharge and from where:  06/22/2021-[place] [place]   Attempts:  3rd Attempt  Reason for unsuccessful TCM follow-up call:  Unable to leave message

## 2021-06-27 DIAGNOSIS — B258 Other cytomegaloviral diseases: Secondary | ICD-10-CM | POA: Diagnosis not present

## 2021-06-27 DIAGNOSIS — E669 Obesity, unspecified: Secondary | ICD-10-CM | POA: Diagnosis not present

## 2021-06-27 DIAGNOSIS — Z940 Kidney transplant status: Secondary | ICD-10-CM | POA: Diagnosis not present

## 2021-06-27 DIAGNOSIS — D849 Immunodeficiency, unspecified: Secondary | ICD-10-CM | POA: Diagnosis not present

## 2021-06-29 DIAGNOSIS — R509 Fever, unspecified: Secondary | ICD-10-CM | POA: Diagnosis not present

## 2021-06-29 DIAGNOSIS — D849 Immunodeficiency, unspecified: Secondary | ICD-10-CM | POA: Diagnosis not present

## 2021-06-30 DIAGNOSIS — R509 Fever, unspecified: Secondary | ICD-10-CM | POA: Diagnosis not present

## 2021-07-02 ENCOUNTER — Telehealth: Payer: Self-pay

## 2021-07-02 NOTE — Telephone Encounter (Signed)
Transition Care Management Follow-up Telephone Call Date of discharge and from where: 07/01/2021 from [facility] How have you been since you were released from the hospital? Patient stated that she is feeling well today and did not have any questions or concerns at this time.  Any questions or concerns? No  Items Reviewed: Did the pt receive and understand the discharge instructions provided? Yes  Medications obtained and verified? Yes  Other? No  Any new allergies since your discharge? No  Dietary orders reviewed? No Do you have support at home? Yes    Functional Questionnaire: (I = Independent and D = Dependent) ADLs: I  Bathing/Dressing- I  Meal Prep- I  Eating- I  Maintaining continence- I  Transferring/Ambulation- I  Managing Meds- I  Follow up appointments reviewed:  PCP Hospital f/u appt confirmed? No   Specialist Hospital f/u appt confirmed? Yes  Scheduled to see Organ Transplant follow up on 07/06/2021 @ 10:30am. Are transportation arrangements needed? No  If their condition worsens, is the pt aware to call PCP or go to the Emergency Dept.? Yes Was the patient provided with contact information for the PCP's office or ED? Yes Was to pt encouraged to call back with questions or concerns? Yes

## 2021-07-07 DIAGNOSIS — Z1211 Encounter for screening for malignant neoplasm of colon: Secondary | ICD-10-CM | POA: Diagnosis not present

## 2021-07-07 DIAGNOSIS — Z86010 Personal history of colonic polyps: Secondary | ICD-10-CM | POA: Diagnosis not present

## 2021-07-07 DIAGNOSIS — I10 Essential (primary) hypertension: Secondary | ICD-10-CM | POA: Diagnosis not present

## 2021-07-07 DIAGNOSIS — E669 Obesity, unspecified: Secondary | ICD-10-CM | POA: Diagnosis not present

## 2021-07-07 DIAGNOSIS — E785 Hyperlipidemia, unspecified: Secondary | ICD-10-CM | POA: Diagnosis not present

## 2021-07-08 DIAGNOSIS — Z7952 Long term (current) use of systemic steroids: Secondary | ICD-10-CM | POA: Diagnosis not present

## 2021-07-08 DIAGNOSIS — E11319 Type 2 diabetes mellitus with unspecified diabetic retinopathy without macular edema: Secondary | ICD-10-CM | POA: Diagnosis not present

## 2021-07-08 DIAGNOSIS — Z940 Kidney transplant status: Secondary | ICD-10-CM | POA: Diagnosis not present

## 2021-07-08 DIAGNOSIS — E1165 Type 2 diabetes mellitus with hyperglycemia: Secondary | ICD-10-CM | POA: Diagnosis not present

## 2021-07-08 DIAGNOSIS — Z794 Long term (current) use of insulin: Secondary | ICD-10-CM | POA: Diagnosis not present

## 2021-08-18 DIAGNOSIS — B259 Cytomegaloviral disease, unspecified: Secondary | ICD-10-CM | POA: Diagnosis not present

## 2021-08-18 DIAGNOSIS — Z79899 Other long term (current) drug therapy: Secondary | ICD-10-CM | POA: Diagnosis not present

## 2021-08-18 DIAGNOSIS — I10 Essential (primary) hypertension: Secondary | ICD-10-CM | POA: Diagnosis not present

## 2021-08-18 DIAGNOSIS — Z7952 Long term (current) use of systemic steroids: Secondary | ICD-10-CM | POA: Diagnosis not present

## 2021-08-18 DIAGNOSIS — Z79621 Long term (current) use of calcineurin inhibitor: Secondary | ICD-10-CM | POA: Diagnosis not present

## 2021-08-18 DIAGNOSIS — Z5181 Encounter for therapeutic drug level monitoring: Secondary | ICD-10-CM | POA: Diagnosis not present

## 2021-08-18 DIAGNOSIS — Z792 Long term (current) use of antibiotics: Secondary | ICD-10-CM | POA: Diagnosis not present

## 2021-08-18 DIAGNOSIS — I151 Hypertension secondary to other renal disorders: Secondary | ICD-10-CM | POA: Diagnosis not present

## 2021-08-18 DIAGNOSIS — R809 Proteinuria, unspecified: Secondary | ICD-10-CM | POA: Diagnosis not present

## 2021-08-18 DIAGNOSIS — D759 Disease of blood and blood-forming organs, unspecified: Secondary | ICD-10-CM | POA: Diagnosis not present

## 2021-08-18 DIAGNOSIS — E1165 Type 2 diabetes mellitus with hyperglycemia: Secondary | ICD-10-CM | POA: Diagnosis not present

## 2021-08-18 DIAGNOSIS — E878 Other disorders of electrolyte and fluid balance, not elsewhere classified: Secondary | ICD-10-CM | POA: Diagnosis not present

## 2021-08-18 DIAGNOSIS — Z79624 Long term (current) use of inhibitors of nucleotide synthesis: Secondary | ICD-10-CM | POA: Diagnosis not present

## 2021-08-18 DIAGNOSIS — Z940 Kidney transplant status: Secondary | ICD-10-CM | POA: Diagnosis not present

## 2021-08-18 DIAGNOSIS — Z4822 Encounter for aftercare following kidney transplant: Secondary | ICD-10-CM | POA: Diagnosis not present

## 2021-08-18 DIAGNOSIS — N2889 Other specified disorders of kidney and ureter: Secondary | ICD-10-CM | POA: Diagnosis not present

## 2021-08-21 ENCOUNTER — Telehealth: Payer: Self-pay

## 2021-08-21 NOTE — Telephone Encounter (Signed)
PA FOR NOT COMPLETED DUE TO BEING ON HOLD FROM ENDO.  ?

## 2021-09-01 DIAGNOSIS — Z940 Kidney transplant status: Secondary | ICD-10-CM | POA: Diagnosis not present

## 2021-09-01 DIAGNOSIS — Z794 Long term (current) use of insulin: Secondary | ICD-10-CM | POA: Diagnosis not present

## 2021-09-01 DIAGNOSIS — R809 Proteinuria, unspecified: Secondary | ICD-10-CM | POA: Diagnosis not present

## 2021-09-01 DIAGNOSIS — E119 Type 2 diabetes mellitus without complications: Secondary | ICD-10-CM | POA: Diagnosis not present

## 2021-09-16 DIAGNOSIS — Z794 Long term (current) use of insulin: Secondary | ICD-10-CM | POA: Diagnosis not present

## 2021-09-16 DIAGNOSIS — Z79621 Long term (current) use of calcineurin inhibitor: Secondary | ICD-10-CM | POA: Diagnosis not present

## 2021-09-16 DIAGNOSIS — Z7952 Long term (current) use of systemic steroids: Secondary | ICD-10-CM | POA: Diagnosis not present

## 2021-09-16 DIAGNOSIS — Z5181 Encounter for therapeutic drug level monitoring: Secondary | ICD-10-CM | POA: Diagnosis not present

## 2021-09-16 DIAGNOSIS — B259 Cytomegaloviral disease, unspecified: Secondary | ICD-10-CM | POA: Diagnosis not present

## 2021-09-16 DIAGNOSIS — I10 Essential (primary) hypertension: Secondary | ICD-10-CM | POA: Diagnosis not present

## 2021-09-16 DIAGNOSIS — Z940 Kidney transplant status: Secondary | ICD-10-CM | POA: Diagnosis not present

## 2021-09-16 DIAGNOSIS — E785 Hyperlipidemia, unspecified: Secondary | ICD-10-CM | POA: Diagnosis not present

## 2021-09-16 DIAGNOSIS — Z79899 Other long term (current) drug therapy: Secondary | ICD-10-CM | POA: Diagnosis not present

## 2021-09-16 DIAGNOSIS — R911 Solitary pulmonary nodule: Secondary | ICD-10-CM | POA: Diagnosis not present

## 2021-09-16 DIAGNOSIS — D649 Anemia, unspecified: Secondary | ICD-10-CM | POA: Diagnosis not present

## 2021-09-16 DIAGNOSIS — E1122 Type 2 diabetes mellitus with diabetic chronic kidney disease: Secondary | ICD-10-CM | POA: Diagnosis not present

## 2021-09-16 DIAGNOSIS — Z4822 Encounter for aftercare following kidney transplant: Secondary | ICD-10-CM | POA: Diagnosis not present

## 2021-09-16 DIAGNOSIS — E119 Type 2 diabetes mellitus without complications: Secondary | ICD-10-CM | POA: Diagnosis not present

## 2021-09-21 ENCOUNTER — Other Ambulatory Visit: Payer: Self-pay | Admitting: Internal Medicine

## 2021-09-21 DIAGNOSIS — E1122 Type 2 diabetes mellitus with diabetic chronic kidney disease: Secondary | ICD-10-CM

## 2021-09-26 ENCOUNTER — Other Ambulatory Visit: Payer: Self-pay

## 2021-09-26 MED ORDER — OZEMPIC (0.25 OR 0.5 MG/DOSE) 2 MG/3ML ~~LOC~~ SOPN: 0.5000 mg | PEN_INJECTOR | SUBCUTANEOUS | 1 refills | Status: DC

## 2021-09-30 DIAGNOSIS — Z79621 Long term (current) use of calcineurin inhibitor: Secondary | ICD-10-CM | POA: Diagnosis not present

## 2021-09-30 DIAGNOSIS — Z4822 Encounter for aftercare following kidney transplant: Secondary | ICD-10-CM | POA: Diagnosis not present

## 2021-10-17 ENCOUNTER — Other Ambulatory Visit: Payer: Self-pay | Admitting: Internal Medicine

## 2021-10-19 DIAGNOSIS — E119 Type 2 diabetes mellitus without complications: Secondary | ICD-10-CM | POA: Diagnosis not present

## 2021-10-19 DIAGNOSIS — Z7985 Long-term (current) use of injectable non-insulin antidiabetic drugs: Secondary | ICD-10-CM | POA: Diagnosis not present

## 2021-10-19 DIAGNOSIS — Z794 Long term (current) use of insulin: Secondary | ICD-10-CM | POA: Diagnosis not present

## 2021-10-19 DIAGNOSIS — E1122 Type 2 diabetes mellitus with diabetic chronic kidney disease: Secondary | ICD-10-CM | POA: Diagnosis not present

## 2021-10-19 DIAGNOSIS — Z940 Kidney transplant status: Secondary | ICD-10-CM | POA: Diagnosis not present

## 2021-10-21 DIAGNOSIS — Z792 Long term (current) use of antibiotics: Secondary | ICD-10-CM | POA: Diagnosis not present

## 2021-10-21 DIAGNOSIS — Z79624 Long term (current) use of inhibitors of nucleotide synthesis: Secondary | ICD-10-CM | POA: Diagnosis not present

## 2021-10-21 DIAGNOSIS — Z4822 Encounter for aftercare following kidney transplant: Secondary | ICD-10-CM | POA: Diagnosis not present

## 2021-10-21 DIAGNOSIS — E1122 Type 2 diabetes mellitus with diabetic chronic kidney disease: Secondary | ICD-10-CM | POA: Diagnosis not present

## 2021-10-21 DIAGNOSIS — D849 Immunodeficiency, unspecified: Secondary | ICD-10-CM | POA: Diagnosis not present

## 2021-10-21 DIAGNOSIS — R809 Proteinuria, unspecified: Secondary | ICD-10-CM | POA: Diagnosis not present

## 2021-10-21 DIAGNOSIS — E669 Obesity, unspecified: Secondary | ICD-10-CM | POA: Diagnosis not present

## 2021-10-21 DIAGNOSIS — Z79621 Long term (current) use of calcineurin inhibitor: Secondary | ICD-10-CM | POA: Diagnosis not present

## 2021-10-21 DIAGNOSIS — Z79899 Other long term (current) drug therapy: Secondary | ICD-10-CM | POA: Diagnosis not present

## 2021-10-21 DIAGNOSIS — N2889 Other specified disorders of kidney and ureter: Secondary | ICD-10-CM | POA: Diagnosis not present

## 2021-10-21 DIAGNOSIS — I151 Hypertension secondary to other renal disorders: Secondary | ICD-10-CM | POA: Diagnosis not present

## 2021-10-21 DIAGNOSIS — B259 Cytomegaloviral disease, unspecified: Secondary | ICD-10-CM | POA: Diagnosis not present

## 2021-10-21 DIAGNOSIS — Z940 Kidney transplant status: Secondary | ICD-10-CM | POA: Diagnosis not present

## 2021-10-21 DIAGNOSIS — B2701 Gammaherpesviral mononucleosis with polyneuropathy: Secondary | ICD-10-CM | POA: Diagnosis not present

## 2021-10-21 DIAGNOSIS — B2700 Gammaherpesviral mononucleosis without complication: Secondary | ICD-10-CM | POA: Diagnosis not present

## 2021-10-21 DIAGNOSIS — Z5181 Encounter for therapeutic drug level monitoring: Secondary | ICD-10-CM | POA: Diagnosis not present

## 2021-10-21 DIAGNOSIS — Z7952 Long term (current) use of systemic steroids: Secondary | ICD-10-CM | POA: Diagnosis not present

## 2021-11-04 DIAGNOSIS — Z4822 Encounter for aftercare following kidney transplant: Secondary | ICD-10-CM | POA: Diagnosis not present

## 2021-11-18 DIAGNOSIS — Z4822 Encounter for aftercare following kidney transplant: Secondary | ICD-10-CM | POA: Diagnosis not present

## 2021-11-18 DIAGNOSIS — R911 Solitary pulmonary nodule: Secondary | ICD-10-CM | POA: Diagnosis not present

## 2021-11-18 DIAGNOSIS — D849 Immunodeficiency, unspecified: Secondary | ICD-10-CM | POA: Diagnosis not present

## 2021-11-18 DIAGNOSIS — E119 Type 2 diabetes mellitus without complications: Secondary | ICD-10-CM | POA: Diagnosis not present

## 2021-11-18 DIAGNOSIS — E785 Hyperlipidemia, unspecified: Secondary | ICD-10-CM | POA: Diagnosis not present

## 2021-11-18 DIAGNOSIS — E1122 Type 2 diabetes mellitus with diabetic chronic kidney disease: Secondary | ICD-10-CM | POA: Diagnosis not present

## 2021-11-18 DIAGNOSIS — I2510 Atherosclerotic heart disease of native coronary artery without angina pectoris: Secondary | ICD-10-CM | POA: Diagnosis not present

## 2021-11-18 DIAGNOSIS — Z794 Long term (current) use of insulin: Secondary | ICD-10-CM | POA: Diagnosis not present

## 2021-11-18 DIAGNOSIS — B259 Cytomegaloviral disease, unspecified: Secondary | ICD-10-CM | POA: Diagnosis not present

## 2021-11-18 DIAGNOSIS — Z940 Kidney transplant status: Secondary | ICD-10-CM | POA: Diagnosis not present

## 2021-11-18 DIAGNOSIS — I10 Essential (primary) hypertension: Secondary | ICD-10-CM | POA: Diagnosis not present

## 2021-11-18 DIAGNOSIS — Z79899 Other long term (current) drug therapy: Secondary | ICD-10-CM | POA: Diagnosis not present

## 2021-11-18 DIAGNOSIS — D649 Anemia, unspecified: Secondary | ICD-10-CM | POA: Diagnosis not present

## 2021-11-24 ENCOUNTER — Encounter: Payer: Self-pay | Admitting: Internal Medicine

## 2021-11-24 ENCOUNTER — Ambulatory Visit (INDEPENDENT_AMBULATORY_CARE_PROVIDER_SITE_OTHER): Payer: Medicare HMO | Admitting: Internal Medicine

## 2021-11-24 VITALS — BP 142/70 | HR 72 | Temp 98.3°F | Ht 67.2 in | Wt 221.0 lb

## 2021-11-24 DIAGNOSIS — E6609 Other obesity due to excess calories: Secondary | ICD-10-CM

## 2021-11-24 DIAGNOSIS — Z940 Kidney transplant status: Secondary | ICD-10-CM

## 2021-11-24 DIAGNOSIS — I129 Hypertensive chronic kidney disease with stage 1 through stage 4 chronic kidney disease, or unspecified chronic kidney disease: Secondary | ICD-10-CM

## 2021-11-24 DIAGNOSIS — E2839 Other primary ovarian failure: Secondary | ICD-10-CM | POA: Diagnosis not present

## 2021-11-24 DIAGNOSIS — Z0000 Encounter for general adult medical examination without abnormal findings: Secondary | ICD-10-CM

## 2021-11-24 DIAGNOSIS — Z6834 Body mass index (BMI) 34.0-34.9, adult: Secondary | ICD-10-CM | POA: Diagnosis not present

## 2021-11-24 DIAGNOSIS — Z794 Long term (current) use of insulin: Secondary | ICD-10-CM | POA: Diagnosis not present

## 2021-11-24 DIAGNOSIS — E1122 Type 2 diabetes mellitus with diabetic chronic kidney disease: Secondary | ICD-10-CM | POA: Diagnosis not present

## 2021-11-24 DIAGNOSIS — N184 Chronic kidney disease, stage 4 (severe): Secondary | ICD-10-CM | POA: Diagnosis not present

## 2021-11-24 DIAGNOSIS — Z1231 Encounter for screening mammogram for malignant neoplasm of breast: Secondary | ICD-10-CM

## 2021-11-24 LAB — POCT URINALYSIS DIPSTICK
Bilirubin, UA: NEGATIVE
Glucose, UA: NEGATIVE
Ketones, UA: NEGATIVE
Leukocytes, UA: NEGATIVE
Nitrite, UA: NEGATIVE
Protein, UA: POSITIVE — AB
Spec Grav, UA: 1.02 (ref 1.010–1.025)
Urobilinogen, UA: 0.2 E.U./dL
pH, UA: 5.5 (ref 5.0–8.0)

## 2021-11-24 NOTE — Addendum Note (Signed)
Addended by: Blanca Friend on: 11/24/2021 04:02 PM   Modules accepted: Orders

## 2021-11-24 NOTE — Progress Notes (Signed)
Rich Brave [place]leton,acting as a Education administrator for Maximino Greenland, MD.,have documented all relevant documentation on the behalf of Maximino Greenland, MD,as directed by  Maximino Greenland, MD while in the presence of Maximino Greenland, MD.   Subjective:     Patient ID: Samantha [name] , female    DOB: [birth year] , 56 y.o.   MRN: [id number]   Chief Complaint  Patient presents with   Annual Exam   Diabetes   Hypertension    HPI  She is here tdoay for a full physical exam. She is followed by Dr. Jennelle Human for her GYN exams.   Hypertension This is a chronic problem. The current episode started more than 1 year ago. The problem has been gradually improving since onset. The problem is controlled. Pertinent negatives include no blurred vision. Past treatments include ACE inhibitors and diuretics. The current treatment provides moderate improvement. Compliance problems include exercise.  Hypertensive end-organ damage includes kidney disease.  Diabetes She presents for her follow-up diabetic visit. She has type 2 diabetes mellitus. Her disease course has been stable. There are no hypoglycemic associated symptoms. Pertinent negatives for diabetes include no blurred vision. There are no hypoglycemic complications. Symptoms are improving. Diabetic complications include nephropathy. Risk factors for coronary artery disease include diabetes mellitus, dyslipidemia, hypertension, obesity and sedentary lifestyle. She is following a generally healthy diet. She participates in exercise intermittently. Her breakfast blood glucose range is generally 130-140 mg/dl. An ACE inhibitor/angiotensin II receptor blocker is contraindicated.     Past Medical History:  Diagnosis Date   Anemia in chronic kidney disease (CKD)    Chronic kidney disease, stage V (HCC)    Hypertension    Kidney transplanted    Uncontrolled diabetes mellitus with microalbuminuria or microproteinuria      Family History  Problem Relation Age of  Onset   Hypertension Mother    Diabetes Mother    Kidney disease Mother    Crohn's disease Mother    Diabetes Father    Hypertension Father    Ulcers Father    Kidney disease Father      Current Outpatient Medications:    atorvastatin (LIPITOR) 80 MG tablet, Take 80 mg by mouth daily., Disp: , Rfl:    cyclobenzaprine (FLEXERIL) 5 MG tablet, Take 1 tablet (5 mg total) by mouth at bedtime as needed (back/hip pain)., Disp: 30 tablet, Rfl: 0   insulin glargine (LANTUS SOLOSTAR) 100 UNIT/ML Solostar Pen, Inject 20 Units into the skin daily., Disp: 45 mL, Rfl: 3   insulin lispro (HUMALOG KWIKPEN) 100 UNIT/ML KwikPen, Per sliding scale through [facility] transplant team (Patient taking differently: 0-10 Units. Per sliding scale through [facility] transplant team), Disp: 15 mL, Rfl: 11   meclizine (ANTIVERT) 12.5 MG tablet, Take 1 tablet (12.5 mg total) by mouth 3 (three) times daily as needed for dizziness., Disp: 30 tablet, Rfl: 0   mycophenolate (MYFORTIC) 180 MG EC tablet, Take 720 mg by mouth 2 (two) times daily., Disp: , Rfl:    NIFEdipine (PROCARDIA XL/NIFEDICAL XL) 60 MG 24 hr tablet, Take 60 mg by mouth daily., Disp: , Rfl:    OZEMPIC, 0.25 OR 0.5 MG/DOSE, 2 MG/1.5ML SOPN, Inject 0.5 mg into the skin once a week., Disp: 4.5 mL, Rfl: 3   predniSONE (DELTASONE) 5 MG tablet, Take 5 mg by mouth daily., Disp: , Rfl:    sodium bicarbonate 650 MG tablet, Take 1,300 mg by mouth 3 (three) times daily., Disp: , Rfl:  tacrolimus (PROGRAF) 1 MG capsule, Take 3-4 mg by mouth See admin instructions. '4mg'$  in the morning, '3mg'$  in the evening, Disp: , Rfl:    valGANciclovir (VALCYTE) 450 MG tablet, Take by mouth., Disp: , Rfl:    potassium chloride SA (KLOR-CON M) 20 MEQ tablet, Take 1 tablet (20 mEq total) by mouth daily at 6 (six) AM for 2 doses. (Patient not taking: Reported on 11/24/2021), Disp: 2 tablet, Rfl: 0   No Known Allergies    The patient states she uses post menopausal status for birth  control. Last LMP was No LMP recorded. Patient has had an ablation.. Negative for Dysmenorrhea. Negative for: breast discharge, breast lump(s), breast pain and breast self exam. Associated symptoms include abnormal vaginal bleeding. Pertinent negatives include abnormal bleeding (hematology), anxiety, decreased libido, depression, difficulty falling sleep, dyspareunia, history of infertility, nocturia, sexual dysfunction, sleep disturbances, urinary incontinence, urinary urgency, vaginal discharge and vaginal itching. Diet regular.The patient states her exercise level is  intermittent.  . The patient's tobacco use is:  Social History   Tobacco Use  Smoking Status Never  Smokeless Tobacco Never  . She has been exposed to passive smoke. The patient's alcohol use is:  Social History   Substance and Sexual Activity  Alcohol Use No   Review of Systems  Constitutional: Negative.   HENT: Negative.    Eyes: Negative.  Negative for blurred vision.  Respiratory: Negative.    Cardiovascular: Negative.   Gastrointestinal: Negative.   Endocrine: Negative.   Genitourinary: Negative.   Musculoskeletal: Negative.   Skin: Negative.   Allergic/Immunologic: Negative.   Neurological: Negative.   Hematological: Negative.   Psychiatric/Behavioral: Negative.       Today's Vitals   11/24/21 1126  BP: (!) 142/70  Pulse: 72  Temp: 98.3 F (36.8 C)  Weight: 221 lb (100.2 kg)  Height: 5' 7.2" (1.707 m)  PainSc: 0-No pain   Body mass index is 34.41 kg/m.  Wt Readings from Last 3 Encounters:  11/24/21 221 lb (100.2 kg)  06/22/21 197 lb 5 oz (89.5 kg)  05/21/21 212 lb 6.4 oz (96.3 kg)     Objective:  Physical Exam Vitals and nursing note reviewed.  Constitutional:      Appearance: Normal appearance. She is obese.  HENT:     Head: Normocephalic and atraumatic.     Right Ear: Tympanic membrane, ear canal and external ear normal.     Left Ear: Tympanic membrane, ear canal and external ear  normal.     Nose: Nose normal.     Mouth/Throat:     Mouth: Mucous membranes are moist.     Pharynx: Oropharynx is clear.  Eyes:     Extraocular Movements: Extraocular movements intact.     Conjunctiva/sclera: Conjunctivae normal.     Pupils: Pupils are equal, round, and reactive to light.  Cardiovascular:     Rate and Rhythm: Normal rate and regular rhythm.     Pulses:          Dorsalis pedis pulses are 1+ on the right side and 1+ on the left side.     Heart sounds: Normal heart sounds.  Pulmonary:     Effort: Pulmonary effort is normal.     Breath sounds: Normal breath sounds.  Chest:  Breasts:    Tanner Score is 5.     Right: Normal.     Left: Normal.  Abdominal:     General: Bowel sounds are normal.     Palpations: Abdomen is  soft.     Comments: Healed surgical scar RLQ Obese, soft, NABS  Genitourinary:    Comments: deferred Musculoskeletal:        General: Normal range of motion.     Cervical back: Normal range of motion and neck supple.  Feet:     Right foot:     Protective Sensation: 5 sites tested.  5 sites sensed.     Skin integrity: Dry skin present.     Toenail Condition: Right toenails are abnormally thick.     Left foot:     Protective Sensation: 5 sites tested.  5 sites sensed.     Skin integrity: Dry skin present.     Toenail Condition: Left toenails are abnormally thick.  Skin:    General: Skin is warm and dry.  Neurological:     General: No focal deficit present.     Mental Status: She is alert and oriented to person, place, and time.  Psychiatric:        Mood and Affect: Mood normal.        Behavior: Behavior normal.      Assessment And Plan:     1. Encounter for general adult medical examination w/o abnormal findings Comments: A full exam was performed. Importance of monthly self breast exams was discussed with the patient. PATIENT IS ADVISED TO GET 30-45 MINUTES REGULAR EXERCISE NO LESS THAN FOUR TO FIVE DAYS PER WEEK - BOTH WEIGHTBEARING  EXERCISES AND AEROBIC ARE RECOMMENDED.  PATIENT IS ADVISED TO FOLLOW A HEALTHY DIET WITH AT LEAST SIX FRUITS/VEGGIES PER DAY, DECREASE INTAKE OF RED MEAT, AND TO INCREASE FISH INTAKE TO TWO DAYS PER WEEK.  MEATS/FISH SHOULD NOT BE FRIED, BAKED OR BROILED IS PREFERABLE.  IT IS ALSO IMPORTANT TO CUT BACK ON YOUR SUGAR INTAKE. PLEASE AVOID ANYTHING WITH ADDED SUGAR, CORN SYRUP OR OTHER SWEETENERS. IF YOU MUST USE A SWEETENER, YOU CAN TRY STEVIA. IT IS ALSO IMPORTANT TO AVOID ARTIFICIALLY SWEETENERS AND DIET BEVERAGES. LASTLY, I SUGGEST WEARING SPF 50 SUNSCREEN ON EXPOSED PARTS AND ESPECIALLY WHEN IN THE DIRECT SUNLIGHT FOR AN EXTENDED PERIOD OF TIME.  PLEASE AVOID FAST FOOD RESTAURANTS AND INCREASE YOUR WATER INTAKE. - Lipid panel - MM DIGITAL SCREENING BILATERAL; Future - DG Bone Density; Future  2. Hypertensive nephropathy Comments: Chronic, uncontrolled. EKG not performed, results from February 2023 reviewed.  She will c/w nifedipine for now.  She is encouraged to follow low sodium diet.  3. Type 2 diabetes mellitus with stage 4 chronic kidney disease, with long-term current use of insulin (HCC) Comments: Diabetic foot exam was performed. She will c/w with Endo for DM mgmt. She will rto in 6 months for re-evaluation. I DISCUSSED WITH THE PATIENT AT LENGTH REGARDING THE GOALS OF GLYCEMIC CONTROL AND POSSIBLE LONG-TERM COMPLICATIONS.  I  ALSO STRESSED THE IMPORTANCE OF COMPLIANCE WITH HOME GLUCOSE MONITORING, DIETARY RESTRICTIONS INCLUDING AVOIDANCE OF SUGARY DRINKS/PROCESSED FOODS,  ALONG WITH REGULAR EXERCISE.  I  ALSO STRESSED THE IMPORTANCE OF ANNUAL EYE EXAMS, SELF FOOT CARE AND COMPLIANCE WITH OFFICE VISITS. - Urine microalbumin-creatinine with uACR  4. Estrogen deficiency Comments: She agrees to bone density evaluation.  - DG Bone Density; Future  5. Class 1 obesity due to excess calories with serious comorbidity and body mass index (BMI) of 34.0 to 34.9 in adult Comments: She is encouraged to  aim for at least 150 minutes of exercise per week, while striving for BMI<30 to decrease cardiac risk.   6. Encounter for screening mammogram for malignant  neoplasm of breast Comments: I will refer her to the [facility] for mammogram. She is encouraged to perform monthly self breast exams.  - MM DIGITAL SCREENING BILATERAL; Future  7. Renal transplant, status post Comments: This was performed November 2021. She agrees to have bone density performed.  - DG Bone Density; Future   Patient was given opportunity to ask questions. Patient verbalized understanding of the plan and was able to repeat key elements of the plan. All questions were answered to their satisfaction.   I, Maximino Greenland, MD, have reviewed all documentation for this visit. The documentation on 11/24/21 for the exam, diagnosis, procedures, and orders are all accurate and complete.   THE PATIENT IS ENCOURAGED TO PRACTICE SOCIAL DISTANCING DUE TO THE COVID-19 PANDEMIC.

## 2021-11-24 NOTE — Patient Instructions (Signed)

## 2021-11-25 LAB — LIPID PANEL
Chol/HDL Ratio: 2.1 ratio (ref 0.0–4.4)
Cholesterol, Total: 112 mg/dL (ref 100–199)
HDL: 53 mg/dL (ref >39)
LDL Chol Calc (NIH): 43 mg/dL (ref 0–99)
Triglycerides: 79 mg/dL (ref 0–149)
VLDL Cholesterol Cal: 16 mg/dL (ref 5–40)

## 2021-11-25 LAB — MICROALBUMIN / CREATININE URINE RATIO
Creatinine, Urine: 184.2 mg/dL
Microalb/Creat Ratio: 136 mg/g creat — ABNORMAL HIGH (ref 0–29)
Microalbumin, Urine: 251 ug/mL

## 2021-12-02 DIAGNOSIS — Z4822 Encounter for aftercare following kidney transplant: Secondary | ICD-10-CM | POA: Diagnosis not present

## 2021-12-02 DIAGNOSIS — Z940 Kidney transplant status: Secondary | ICD-10-CM | POA: Diagnosis not present

## 2021-12-15 DIAGNOSIS — D649 Anemia, unspecified: Secondary | ICD-10-CM | POA: Diagnosis not present

## 2021-12-15 DIAGNOSIS — Z4822 Encounter for aftercare following kidney transplant: Secondary | ICD-10-CM | POA: Diagnosis not present

## 2021-12-15 DIAGNOSIS — R911 Solitary pulmonary nodule: Secondary | ICD-10-CM | POA: Diagnosis not present

## 2021-12-15 DIAGNOSIS — I10 Essential (primary) hypertension: Secondary | ICD-10-CM | POA: Diagnosis not present

## 2021-12-15 DIAGNOSIS — E785 Hyperlipidemia, unspecified: Secondary | ICD-10-CM | POA: Diagnosis not present

## 2021-12-15 DIAGNOSIS — E119 Type 2 diabetes mellitus without complications: Secondary | ICD-10-CM | POA: Diagnosis not present

## 2021-12-15 DIAGNOSIS — I2510 Atherosclerotic heart disease of native coronary artery without angina pectoris: Secondary | ICD-10-CM | POA: Diagnosis not present

## 2021-12-24 ENCOUNTER — Other Ambulatory Visit: Payer: Medicare HMO

## 2021-12-29 DIAGNOSIS — Z940 Kidney transplant status: Secondary | ICD-10-CM | POA: Diagnosis not present

## 2021-12-29 DIAGNOSIS — D849 Immunodeficiency, unspecified: Secondary | ICD-10-CM | POA: Diagnosis not present

## 2022-01-01 DIAGNOSIS — Z1231 Encounter for screening mammogram for malignant neoplasm of breast: Secondary | ICD-10-CM | POA: Diagnosis not present

## 2022-01-01 DIAGNOSIS — Z0000 Encounter for general adult medical examination without abnormal findings: Secondary | ICD-10-CM | POA: Diagnosis not present

## 2022-01-01 LAB — HM MAMMOGRAPHY: HM Mammogram: NORMAL (ref 0–4)

## 2022-01-04 DIAGNOSIS — Z124 Encounter for screening for malignant neoplasm of cervix: Secondary | ICD-10-CM | POA: Diagnosis not present

## 2022-01-04 DIAGNOSIS — R87610 Atypical squamous cells of undetermined significance on cytologic smear of cervix (ASC-US): Secondary | ICD-10-CM | POA: Diagnosis not present

## 2022-01-04 DIAGNOSIS — R875 Abnormal microbiological findings in specimens from female genital organs: Secondary | ICD-10-CM | POA: Diagnosis not present

## 2022-01-13 DIAGNOSIS — Z4822 Encounter for aftercare following kidney transplant: Secondary | ICD-10-CM | POA: Diagnosis not present

## 2022-01-13 DIAGNOSIS — B259 Cytomegaloviral disease, unspecified: Secondary | ICD-10-CM | POA: Diagnosis not present

## 2022-01-13 DIAGNOSIS — Z79624 Long term (current) use of inhibitors of nucleotide synthesis: Secondary | ICD-10-CM | POA: Diagnosis not present

## 2022-01-13 DIAGNOSIS — Z940 Kidney transplant status: Secondary | ICD-10-CM | POA: Diagnosis not present

## 2022-01-13 DIAGNOSIS — I10 Essential (primary) hypertension: Secondary | ICD-10-CM | POA: Diagnosis not present

## 2022-01-13 DIAGNOSIS — R809 Proteinuria, unspecified: Secondary | ICD-10-CM | POA: Diagnosis not present

## 2022-01-13 DIAGNOSIS — D849 Immunodeficiency, unspecified: Secondary | ICD-10-CM | POA: Diagnosis not present

## 2022-01-13 DIAGNOSIS — Z7952 Long term (current) use of systemic steroids: Secondary | ICD-10-CM | POA: Diagnosis not present

## 2022-01-13 DIAGNOSIS — Z79899 Other long term (current) drug therapy: Secondary | ICD-10-CM | POA: Diagnosis not present

## 2022-01-13 DIAGNOSIS — Z792 Long term (current) use of antibiotics: Secondary | ICD-10-CM | POA: Diagnosis not present

## 2022-01-13 DIAGNOSIS — Z79621 Long term (current) use of calcineurin inhibitor: Secondary | ICD-10-CM | POA: Diagnosis not present

## 2022-01-13 DIAGNOSIS — Z5181 Encounter for therapeutic drug level monitoring: Secondary | ICD-10-CM | POA: Diagnosis not present

## 2022-01-13 LAB — HM PAP SMEAR

## 2022-01-14 DIAGNOSIS — L304 Erythema intertrigo: Secondary | ICD-10-CM | POA: Diagnosis not present

## 2022-01-14 DIAGNOSIS — D229 Melanocytic nevi, unspecified: Secondary | ICD-10-CM | POA: Diagnosis not present

## 2022-01-14 DIAGNOSIS — Z940 Kidney transplant status: Secondary | ICD-10-CM | POA: Diagnosis not present

## 2022-01-14 DIAGNOSIS — L821 Other seborrheic keratosis: Secondary | ICD-10-CM | POA: Diagnosis not present

## 2022-01-22 DIAGNOSIS — Z1211 Encounter for screening for malignant neoplasm of colon: Secondary | ICD-10-CM | POA: Diagnosis not present

## 2022-01-22 DIAGNOSIS — Z86010 Personal history of colonic polyps: Secondary | ICD-10-CM | POA: Diagnosis not present

## 2022-01-22 LAB — HM COLONOSCOPY

## 2022-01-25 DIAGNOSIS — E1122 Type 2 diabetes mellitus with diabetic chronic kidney disease: Secondary | ICD-10-CM | POA: Diagnosis not present

## 2022-01-26 DIAGNOSIS — R87610 Atypical squamous cells of undetermined significance on cytologic smear of cervix (ASC-US): Secondary | ICD-10-CM | POA: Diagnosis not present

## 2022-01-26 DIAGNOSIS — R87810 Cervical high risk human papillomavirus (HPV) DNA test positive: Secondary | ICD-10-CM | POA: Diagnosis not present

## 2022-01-28 DIAGNOSIS — Z940 Kidney transplant status: Secondary | ICD-10-CM | POA: Diagnosis not present

## 2022-01-28 DIAGNOSIS — Z4822 Encounter for aftercare following kidney transplant: Secondary | ICD-10-CM | POA: Diagnosis not present

## 2022-02-03 ENCOUNTER — Other Ambulatory Visit: Payer: Self-pay | Admitting: Internal Medicine

## 2022-02-18 DIAGNOSIS — B258 Other cytomegaloviral diseases: Secondary | ICD-10-CM | POA: Diagnosis not present

## 2022-02-18 DIAGNOSIS — E785 Hyperlipidemia, unspecified: Secondary | ICD-10-CM | POA: Diagnosis not present

## 2022-02-18 DIAGNOSIS — Z4822 Encounter for aftercare following kidney transplant: Secondary | ICD-10-CM | POA: Diagnosis not present

## 2022-02-18 DIAGNOSIS — I10 Essential (primary) hypertension: Secondary | ICD-10-CM | POA: Diagnosis not present

## 2022-02-18 DIAGNOSIS — R911 Solitary pulmonary nodule: Secondary | ICD-10-CM | POA: Diagnosis not present

## 2022-02-18 DIAGNOSIS — N390 Urinary tract infection, site not specified: Secondary | ICD-10-CM | POA: Diagnosis not present

## 2022-02-18 DIAGNOSIS — B259 Cytomegaloviral disease, unspecified: Secondary | ICD-10-CM | POA: Diagnosis not present

## 2022-02-18 DIAGNOSIS — D649 Anemia, unspecified: Secondary | ICD-10-CM | POA: Diagnosis not present

## 2022-02-18 DIAGNOSIS — E1122 Type 2 diabetes mellitus with diabetic chronic kidney disease: Secondary | ICD-10-CM | POA: Diagnosis not present

## 2022-02-18 DIAGNOSIS — Z940 Kidney transplant status: Secondary | ICD-10-CM | POA: Diagnosis not present

## 2022-02-18 DIAGNOSIS — E119 Type 2 diabetes mellitus without complications: Secondary | ICD-10-CM | POA: Diagnosis not present

## 2022-02-18 DIAGNOSIS — D849 Immunodeficiency, unspecified: Secondary | ICD-10-CM | POA: Diagnosis not present

## 2022-02-18 DIAGNOSIS — N179 Acute kidney failure, unspecified: Secondary | ICD-10-CM | POA: Diagnosis not present

## 2022-03-01 DIAGNOSIS — I10 Essential (primary) hypertension: Secondary | ICD-10-CM | POA: Diagnosis not present

## 2022-03-01 DIAGNOSIS — Z940 Kidney transplant status: Secondary | ICD-10-CM | POA: Diagnosis not present

## 2022-03-01 DIAGNOSIS — E1129 Type 2 diabetes mellitus with other diabetic kidney complication: Secondary | ICD-10-CM | POA: Diagnosis not present

## 2022-03-01 DIAGNOSIS — Z794 Long term (current) use of insulin: Secondary | ICD-10-CM | POA: Diagnosis not present

## 2022-03-01 DIAGNOSIS — Z7985 Long-term (current) use of injectable non-insulin antidiabetic drugs: Secondary | ICD-10-CM | POA: Diagnosis not present

## 2022-03-01 DIAGNOSIS — E1165 Type 2 diabetes mellitus with hyperglycemia: Secondary | ICD-10-CM | POA: Diagnosis not present

## 2022-03-01 DIAGNOSIS — E041 Nontoxic single thyroid nodule: Secondary | ICD-10-CM | POA: Diagnosis not present

## 2022-03-01 DIAGNOSIS — Z7952 Long term (current) use of systemic steroids: Secondary | ICD-10-CM | POA: Diagnosis not present

## 2022-03-11 ENCOUNTER — Ambulatory Visit (INDEPENDENT_AMBULATORY_CARE_PROVIDER_SITE_OTHER): Payer: Medicare HMO

## 2022-03-11 VITALS — Ht 68.0 in | Wt 220.0 lb

## 2022-03-11 DIAGNOSIS — B259 Cytomegaloviral disease, unspecified: Secondary | ICD-10-CM | POA: Diagnosis not present

## 2022-03-11 DIAGNOSIS — Z0000 Encounter for general adult medical examination without abnormal findings: Secondary | ICD-10-CM

## 2022-03-11 DIAGNOSIS — E041 Nontoxic single thyroid nodule: Secondary | ICD-10-CM | POA: Diagnosis not present

## 2022-03-11 DIAGNOSIS — D849 Immunodeficiency, unspecified: Secondary | ICD-10-CM | POA: Diagnosis not present

## 2022-03-11 DIAGNOSIS — Z940 Kidney transplant status: Secondary | ICD-10-CM | POA: Diagnosis not present

## 2022-03-11 NOTE — Progress Notes (Signed)
I connected with Samantha [name] today by telephone and verified that I am speaking with the correct person using two identifiers. Location patient: home Location provider: work Persons participating in the virtual visit: Yasmene Salomone, Glenna Durand LPN.   I discussed the limitations, risks, security and privacy concerns of performing an evaluation and management service by telephone and the availability of in person appointments. I also discussed with the patient that there may be a patient responsible charge related to this service. The patient expressed understanding and verbally consented to this telephonic visit.    Interactive audio and video telecommunications were attempted between this provider and patient, however failed, due to patient having technical difficulties OR patient did not have access to video capability.  We continued and completed visit with audio only.     Vital signs may be patient reported or missing.  Subjective:   Samantha [name] is a 56 y.o. female who presents for an Initial Medicare Annual Wellness Visit.  Review of Systems     Cardiac Risk Factors include: advanced age (>17mn, >>34women);diabetes mellitus;dyslipidemia;hypertension;obesity (BMI >30kg/m2)     Objective:    Today's Vitals   03/11/22 1041  Weight: 220 lb (99.8 kg)  Height: '5\' 8"'$  (1.727 m)   Body mass index is 33.45 kg/m.     03/11/2022   10:48 AM 06/19/2021    9:02 PM 03/23/2016    8:46 AM 10/15/2014   11:05 AM  Advanced Directives  Does Patient Have a Medical Advance Directive? No No No No  Would patient like information on creating a medical advance directive?  No - Patient declined No - patient declined information No - patient declined information    Current Medications (verified) Outpatient Encounter Medications as of 03/11/2022  Medication Sig   atorvastatin (LIPITOR) 80 MG tablet Take 80 mg by mouth daily.   cyclobenzaprine (FLEXERIL) 5 MG tablet TAKE 1 TABLET BY  MOUTH AT BEDTIME AS NEEDED (BACK/HIP PAIN).   insulin glargine (LANTUS SOLOSTAR) 100 UNIT/ML Solostar Pen Inject 20 Units into the skin daily.   insulin lispro (HUMALOG KWIKPEN) 100 UNIT/ML KwikPen [facility] (Patient taking differently: 0-10 Units. [facility])   meclizine (ANTIVERT) 12.5 MG tablet Take 1 tablet (12.5 mg total) by mouth 3 (three) times daily as needed for dizziness.   mycophenolate (MYFORTIC) 180 MG EC tablet Take 720 mg by mouth 2 (two) times daily.   NIFEdipine (PROCARDIA XL/NIFEDICAL XL) 60 MG 24 hr tablet Take 60 mg by mouth daily.   OZEMPIC, 0.25 OR 0.5 MG/DOSE, 2 MG/1.5ML SOPN Inject 0.5 mg into the skin once a week.   predniSONE (DELTASONE) 5 MG tablet Take 5 mg by mouth daily.   sodium bicarbonate 650 MG tablet Take 1,300 mg by mouth 3 (three) times daily.   tacrolimus (PROGRAF) 1 MG capsule Take 3-4 mg by mouth See admin instructions. '4mg'$  in the morning, '3mg'$  in the evening   potassium chloride SA (KLOR-CON M) 20 MEQ tablet Take 1 tablet (20 mEq total) by mouth daily at 6 (six) AM for 2 doses. (Patient not taking: Reported on 11/24/2021)   No facility-administered encounter medications on file as of 03/11/2022.    Allergies (verified) Patient has no known allergies.   History: Past Medical History:  Diagnosis Date   Anemia in chronic kidney disease (CKD)    Chronic kidney disease, stage V ([place]    Hypertension    Kidney transplanted  Uncontrolled diabetes mellitus with microalbuminuria or microproteinuria    Past Surgical History:  Procedure Laterality Date   CARDIAC CATHETERIZATION N/A 03/23/2016   Procedure: Left Heart Cath and Coronary Angiography;  Surgeon: Adrian Prows, MD;  Location: [facility];  Service: Cardiovascular;  Laterality: N/A;   CESAREAN SECTION     DILITATION & CURRETTAGE/HYSTROSCOPY WITH NOVASURE ABLATION N/A 10/24/2014   Procedure: DILATATION &  CURETTAGE/HYSTEROSCOPY WITH NOVASURE ABLATION;  Surgeon: Sanjuana Kava, MD;  Location: [facility];  Service: Gynecology;  Laterality: N/A;   KIDNEY TRANSPLANT  03/28/2020   [facility]   THYROIDECTOMY     Family History  Problem Relation Age of Onset   Hypertension Mother    Diabetes Mother    Kidney disease Mother    Crohn's disease Mother    Diabetes Father    Hypertension Father    Ulcers Father    Kidney disease Father    Social History   Socioeconomic History   Marital status: Divorced    Spouse name: Not on file   Number of children: Not on file   Years of education: Not on file   Highest education level: Not on file  Occupational History   Not on file  Tobacco Use   Smoking status: Never   Smokeless tobacco: Never  Vaping Use   Vaping Use: Never used  Substance and Sexual Activity   Alcohol use: No   Drug use: No   Sexual activity: Not on file  Other Topics Concern   Not on file  Social History Narrative   Not on file   Social Determinants of Health   Financial Resource Strain: Low Risk  (03/11/2022)   Overall Financial Resource Strain (CARDIA)    Difficulty of Paying Living Expenses: Not hard at all  Food Insecurity: No Food Insecurity (03/11/2022)   Hunger Vital Sign    Worried About Running Out of Food in the Last Year: Never true    [place] in the Last Year: Never true  Transportation Needs: No Transportation Needs (03/11/2022)   PRAPARE - Hydrologist (Medical): No    Lack of Transportation (Non-Medical): No  Physical Activity: Inactive (03/11/2022)   Exercise Vital Sign    Days of Exercise per Week: 0 days    Minutes of Exercise per Session: 0 min  Stress: No Stress Concern Present (03/11/2022)   [place]    Feeling of Stress : Not at all  Social Connections: Not on file    Tobacco Counseling Counseling given: Not Answered   Clinical  Intake:  Pre-visit preparation completed: Yes  Pain : No/denies pain     Nutritional Status: BMI > 30  Obese Nutritional Risks: None Diabetes: Yes  How often do you need to have someone help you when you read instructions, pamphlets, or other written materials from your doctor or pharmacy?: 1 - Never What is the last grade level you completed in school?: masters degree  Diabetic? Yes Nutrition Risk Assessment:  Has the patient had any N/V/D within the last 2 months?  No  Does the patient have any non-healing wounds?  No  Has the patient had any unintentional weight loss or weight gain?  Yes   Diabetes:  Is the patient diabetic?  Yes  If diabetic, was a CBG obtained today?  No  Did the patient bring in their glucometer from home?  No  How often do you  monitor your CBG's? continuously.   Financial Strains and Diabetes Management:  Are you having any financial strains with the device, your supplies or your medication? No .  Does the patient want to be seen by Chronic Care Management for management of their diabetes?  No  Would the patient like to be referred to a Nutritionist or for Diabetic Management?  No   Diabetic Exams:  Diabetic Eye Exam: Overdue for diabetic eye exam. Pt has been advised about the importance in completing this exam. Patient advised to call and schedule an eye exam. Diabetic Foot Exam: Overdue, Pt has been advised about the importance in completing this exam. Pt is scheduled for diabetic foot exam on next appointment.   Interpreter Needed?: No  Information entered by :: NAllen LPN   Activities of Daily Living    03/11/2022   10:49 AM 06/20/2021    6:00 PM  In your present state of health, do you have any difficulty performing the following activities:  Hearing? 0 0  Vision? 0 0  Difficulty concentrating or making decisions? 0 0  Walking or climbing stairs? 0 0  Dressing or bathing? 0 0  Doing errands, shopping? 0 0  Preparing Food and eating  ? N   Using the Toilet? N   In the past six months, have you accidently leaked urine? N   Do you have problems with loss of bowel control? N   Managing your Medications? N   Managing your Finances? N   Housekeeping or managing your Housekeeping? N     Patient Care Team: Glendale Chard, MD as PCP - General (Internal Medicine)  Indicate any recent Medical Services you may have received from other than [facility] providers in the past year (date may be approximate).     Assessment:   This is a routine wellness examination for Tayli.  Hearing/Vision screen Vision Screening - Comments:: No regular eye exams,   Dietary issues and exercise activities discussed: Current Exercise Habits: The patient does not participate in regular exercise at present   Goals Addressed             This Visit's Progress    Patient Stated       03/11/2022, wants to lose weight and build energy up       Depression Screen    03/11/2022   10:49 AM 11/24/2021   11:26 AM 11/18/2020   11:52 AM 10/15/2019   11:13 AM 09/27/2018    9:42 AM  PHQ 2/9 Scores  PHQ - 2 Score 0 0 0 0 0    Fall Risk    03/11/2022   10:49 AM 12/28/2018   11:35 AM 09/27/2018    9:42 AM  [place] in the past year? 0 0 0  Number falls in past yr: 0    Injury with Fall? 0    Risk for fall due to : Medication side effect    Follow up Falls prevention discussed;Falls evaluation completed;Education provided      FALL RISK PREVENTION PERTAINING TO THE HOME:  Any stairs in or around the home? No  If so, are there any without handrails? N/a Home free of loose throw rugs in walkways, pet beds, electrical cords, etc? Yes  Adequate lighting in your home to reduce risk of falls? Yes   ASSISTIVE DEVICES UTILIZED TO PREVENT FALLS:  Life alert? No  Use of a cane, walker or w/c? No  Grab bars in the bathroom? No  Shower  chair or bench in shower? No  Elevated toilet seat or a handicapped toilet? No   TIMED UP AND GO:  Was  the test performed? No .      Cognitive Function:        03/11/2022   10:50 AM  6CIT Screen  What Year? 4 points  What month? 0 points  What time? 0 points  Count back from 20 0 points  Months in reverse 0 points  Repeat phrase 0 points  Total Score 4 points    Immunizations Immunization History  Administered Date(s) Administered   Hepb-cpg 01/29/2020   Influenza,inj,Quad PF,6+ Mos 03/01/2013, 02/27/2020, 01/26/2021   PFIZER(Purple Top)SARS-COV-2 Vaccination 11/25/2019, 12/29/2019   Pfizer Covid-19 Vaccine Bivalent Booster 31yr & up 03/24/2021   Pneumococcal Conjugate-13 12/12/2019   Pneumococcal Polysaccharide-23 05/21/2021   Zoster Recombinat (Shingrix) 04/21/2021, 11/18/2021    TDAP status: Up to date  Flu Vaccine status: Due, Education has been provided regarding the importance of this vaccine. Advised may receive this vaccine at local pharmacy or Health Dept. Aware to provide a copy of the vaccination record if obtained from local pharmacy or Health Dept. Verbalized acceptance and understanding.  Pneumococcal vaccine status: Up to date  Covid-19 vaccine status: Completed vaccines  Qualifies for Shingles Vaccine? Yes   Zostavax completed Yes   Shingrix Completed?: Yes  Screening Tests Health Maintenance  Topic Date Due   Medicare Annual Wellness (AWV)  Never done   MAMMOGRAM  02/11/2021   COVID-19 Vaccine (4 - Pfizer risk series) 05/19/2021   INFLUENZA VACCINE  12/08/2021   PAP SMEAR-Modifier  02/07/2022   Diabetic kidney evaluation - GFR measurement  06/22/2022   Diabetic kidney evaluation - Urine ACR  11/25/2022   TETANUS/TDAP  07/19/2024   COLONOSCOPY (Pts 45-458yrInsurance coverage will need to be confirmed)  01/23/2032   Hepatitis C Screening  Completed   HIV Screening  Completed   Zoster Vaccines- Shingrix  Completed   HPV VACCINES  Aged Out    Health Maintenance  Health Maintenance Due  Topic Date Due   Medicare Annual Wellness (AWV)   Never done   MAMMOGRAM  02/11/2021   COVID-19 Vaccine (4 - Pfizer risk series) 05/19/2021   INFLUENZA VACCINE  12/08/2021   PAP SMEAR-Modifier  02/07/2022    Colorectal cancer screening: Type of screening: Colonoscopy. Completed 01/22/2022. Repeat every 5 years  Mammogram status: completed 01/2022  Bone Density status: n/a  Lung Cancer Screening: (Low Dose CT Chest recommended if Age 55-80ears, 30 pack-year currently smoking OR have quit w/in 15years.) does not qualify.   Lung Cancer Screening Referral: no  Additional Screening:  Hepatitis C Screening: does qualify; Completed 03/28/2020  Vision Screening: Recommended annual ophthalmology exams for early detection of glaucoma and other disorders of the eye. Is the patient up to date with their annual eye exam?  No  Who is the provider or what is the name of the office in which the patient attends annual eye exams? none If pt is not established with a provider, would they like to be referred to a provider to establish care? No .   Dental Screening: Recommended annual dental exams for proper oral hygiene  Community Resource Referral / Chronic Care Management: CRR required this visit?  No   CCM required this visit?  No      Plan:     I have personally reviewed and noted the following in the patient's chart:   Medical and social history Use of alcohol,  tobacco or illicit drugs  Current medications and supplements including opioid prescriptions. Patient is not currently taking opioid prescriptions. Functional ability and status Nutritional status Physical activity Advanced directives List of other physicians Hospitalizations, surgeries, and ER visits in previous 12 months Vitals Screenings to include cognitive, depression, and falls Referrals and appointments  In addition, I have reviewed and discussed with patient certain preventive protocols, quality metrics, and best practice recommendations. A written personalized  care plan for preventive services as well as general preventive health recommendations were provided to patient.     Kellie Simmering, LPN   [id number]   Nurse Notes: none  Due to this being a virtual visit, the after visit summary with patients personalized plan was offered to patient via mail or my-chart.  to pick up at office at next visit

## 2022-03-11 NOTE — Patient Instructions (Signed)
Samantha [name] , Thank you for taking time to come for your Medicare Wellness Visit. I appreciate your ongoing commitment to your health goals. Please review the following plan we discussed and let me know if I can assist you in the future.   Screening recommendations/referrals: Colonoscopy: completed 01/22/2022, due 01/23/2027 Mammogram: completed 01/2022 Bone Density: n/a Recommended yearly ophthalmology/optometry visit for glaucoma screening and checkup Recommended yearly dental visit for hygiene and checkup  Vaccinations: Influenza vaccine: due Pneumococcal vaccine: n/a Tdap vaccine: completed 07/20/2014, due 07/19/2024 Shingles vaccine: completed  Covid-19:  03/24/2021, 12/29/2019, 11/25/2019  Advanced directives: Advance directive discussed with you today.   Conditions/risks identified: none  Next appointment: Follow up in one year for your annual wellness visit.   Preventive Care 40-64 Years, Female Preventive care refers to lifestyle choices and visits with your health care provider that can promote health and wellness. What does preventive care include? A yearly physical exam. This is also called an annual well check. Dental exams once or twice a year. Routine eye exams. Ask your health care provider how often you should have your eyes checked. Personal lifestyle choices, including: Daily care of your teeth and gums. Regular physical activity. Eating a healthy diet. Avoiding tobacco and drug use. Limiting alcohol use. Practicing safe sex. Taking low-dose aspirin daily starting at age 28. Taking vitamin and mineral supplements as recommended by your health care provider. What happens during an annual well check? The services and screenings done by your health care provider during your annual well check will depend on your age, overall health, lifestyle risk factors, and family history of disease. Counseling  Your health care provider may ask you questions about your: Alcohol  use. Tobacco use. Drug use. Emotional well-being. Home and relationship well-being. Sexual activity. Eating habits. Work and work Statistician. Method of birth control. Menstrual cycle. Pregnancy history. Screening  You may have the following tests or measurements: Height, weight, and BMI. Blood pressure. Lipid and cholesterol levels. These may be checked every 5 years, or more frequently if you are over 58 years old. Skin check. Lung cancer screening. You may have this screening every year starting at age 74 if you have a 30-pack-year history of smoking and currently smoke or have quit within the past 15 years. Fecal occult blood test (FOBT) of the stool. You may have this test every year starting at age 76. Flexible sigmoidoscopy or colonoscopy. You may have a sigmoidoscopy every 5 years or a colonoscopy every 10 years starting at age 49. Hepatitis C blood test. Hepatitis B blood test. Sexually transmitted disease (STD) testing. Diabetes screening. This is done by checking your blood sugar (glucose) after you have not eaten for a while (fasting). You may have this done every 1-3 years. Mammogram. This may be done every 1-2 years. Talk to your health care provider about when you should start having regular mammograms. This may depend on whether you have a family history of breast cancer. BRCA-related cancer screening. This may be done if you have a family history of breast, ovarian, tubal, or peritoneal cancers. Pelvic exam and Pap test. This may be done every 3 years starting at age 46. Starting at age 28, this may be done every 5 years if you have a Pap test in combination with an HPV test. Bone density scan. This is done to screen for osteoporosis. You may have this scan if you are at high risk for osteoporosis. Discuss your test results, treatment options, and if necessary, the need  for more tests with your health care provider. Vaccines  Your health care provider may recommend  certain vaccines, such as: Influenza vaccine. This is recommended every year. Tetanus, diphtheria, and acellular pertussis (Tdap, Td) vaccine. You may need a Td booster every 10 years. Zoster vaccine. You may need this after age 36. Pneumococcal 13-valent conjugate (PCV13) vaccine. You may need this if you have certain conditions and were not previously vaccinated. Pneumococcal polysaccharide (PPSV23) vaccine. You may need one or two doses if you smoke cigarettes or if you have certain conditions. Talk to your health care provider about which screenings and vaccines you need and how often you need them. This information is not intended to replace advice given to you by your health care provider. Make sure you discuss any questions you have with your health care provider. Document Released: 05/23/2015 Document Revised: 01/14/2016 Document Reviewed: 02/25/2015 Elsevier Interactive Patient Education  2017 [place] Prevention in the Home Falls can cause injuries. They can happen to people of all ages. There are many things you can do to make your home safe and to help prevent falls. What can I do on the outside of my home? Regularly fix the edges of walkways and driveways and fix any cracks. Remove anything that might make you trip as you walk through a door, such as a raised step or threshold. Trim any bushes or trees on the path to your home. Use bright outdoor lighting. Clear any walking paths of anything that might make someone trip, such as rocks or tools. Regularly check to see if handrails are loose or broken. Make sure that both sides of any steps have handrails. Any raised decks and porches should have guardrails on the edges. Have any leaves, snow, or ice cleared regularly. Use sand or salt on walking paths during winter. Clean up any spills in your garage right away. This includes oil or grease spills. What can I do in the bathroom? Use night lights. Install grab bars  by the toilet and in the tub and shower. Do not use towel bars as grab bars. Use non-skid mats or decals in the tub or shower. If you need to sit down in the shower, use a plastic, non-slip stool. Keep the floor dry. Clean up any water that spills on the floor as soon as it happens. Remove soap buildup in the tub or shower regularly. Attach bath mats securely with double-sided non-slip rug tape. Do not have throw rugs and other things on the floor that can make you trip. What can I do in the bedroom? Use night lights. Make sure that you have a light by your bed that is easy to reach. Do not use any sheets or blankets that are too big for your bed. They should not hang down onto the floor. Have a firm chair that has side arms. You can use this for support while you get dressed. Do not have throw rugs and other things on the floor that can make you trip. What can I do in the kitchen? Clean up any spills right away. Avoid walking on wet floors. Keep items that you use a lot in easy-to-reach places. If you need to reach something above you, use a strong step stool that has a grab bar. Keep electrical cords out of the way. Do not use floor polish or wax that makes floors slippery. If you must use wax, use non-skid floor wax. Do not have throw rugs and other  things on the floor that can make you trip. What can I do with my stairs? Do not leave any items on the stairs. Make sure that there are handrails on both sides of the stairs and use them. Fix handrails that are broken or loose. Make sure that handrails are as long as the stairways. Check any carpeting to make sure that it is firmly attached to the stairs. Fix any carpet that is loose or worn. Avoid having throw rugs at the top or bottom of the stairs. If you do have throw rugs, attach them to the floor with carpet tape. Make sure that you have a light switch at the top of the stairs and the bottom of the stairs. If you do not have them, ask  someone to add them for you. What else can I do to help prevent falls? Wear shoes that: Do not have high heels. Have rubber bottoms. Are comfortable and fit you well. Are closed at the toe. Do not wear sandals. If you use a stepladder: Make sure that it is fully opened. Do not climb a closed stepladder. Make sure that both sides of the stepladder are locked into place. Ask someone to hold it for you, if possible. Clearly mark and make sure that you can see: Any grab bars or handrails. First and last steps. Where the edge of each step is. Use tools that help you move around (mobility aids) if they are needed. These include: Canes. Walkers. Scooters. Crutches. Turn on the lights when you go into a dark area. Replace any light bulbs as soon as they burn out. Set up your furniture so you have a clear path. Avoid moving your furniture around. If any of your floors are uneven, fix them. If there are any pets around you, be aware of where they are. Review your medicines with your doctor. Some medicines can make you feel dizzy. This can increase your chance of falling. Ask your doctor what other things that you can do to help prevent falls. This information is not intended to replace advice given to you by your health care provider. Make sure you discuss any questions you have with your health care provider. Document Released: 02/20/2009 Document Revised: 10/02/2015 Document Reviewed: 05/31/2014 Elsevier Interactive Patient Education  2017 [employer].

## 2022-03-19 ENCOUNTER — Other Ambulatory Visit: Payer: Self-pay | Admitting: Internal Medicine

## 2022-03-24 DIAGNOSIS — Z940 Kidney transplant status: Secondary | ICD-10-CM | POA: Diagnosis not present

## 2022-03-24 DIAGNOSIS — E041 Nontoxic single thyroid nodule: Secondary | ICD-10-CM | POA: Diagnosis not present

## 2022-04-07 DIAGNOSIS — Z940 Kidney transplant status: Secondary | ICD-10-CM | POA: Diagnosis not present

## 2022-04-07 DIAGNOSIS — Z79899 Other long term (current) drug therapy: Secondary | ICD-10-CM | POA: Diagnosis not present

## 2022-04-21 DIAGNOSIS — R911 Solitary pulmonary nodule: Secondary | ICD-10-CM | POA: Diagnosis not present

## 2022-04-21 DIAGNOSIS — E118 Type 2 diabetes mellitus with unspecified complications: Secondary | ICD-10-CM | POA: Diagnosis not present

## 2022-04-21 DIAGNOSIS — I10 Essential (primary) hypertension: Secondary | ICD-10-CM | POA: Diagnosis not present

## 2022-04-21 DIAGNOSIS — B258 Other cytomegaloviral diseases: Secondary | ICD-10-CM | POA: Diagnosis not present

## 2022-04-21 DIAGNOSIS — Z5181 Encounter for therapeutic drug level monitoring: Secondary | ICD-10-CM | POA: Diagnosis not present

## 2022-04-21 DIAGNOSIS — Z940 Kidney transplant status: Secondary | ICD-10-CM | POA: Diagnosis not present

## 2022-04-21 DIAGNOSIS — Z79899 Other long term (current) drug therapy: Secondary | ICD-10-CM | POA: Diagnosis not present

## 2022-04-21 DIAGNOSIS — N390 Urinary tract infection, site not specified: Secondary | ICD-10-CM | POA: Diagnosis not present

## 2022-04-21 DIAGNOSIS — E1122 Type 2 diabetes mellitus with diabetic chronic kidney disease: Secondary | ICD-10-CM | POA: Diagnosis not present

## 2022-04-21 DIAGNOSIS — B259 Cytomegaloviral disease, unspecified: Secondary | ICD-10-CM | POA: Diagnosis not present

## 2022-04-21 DIAGNOSIS — D849 Immunodeficiency, unspecified: Secondary | ICD-10-CM | POA: Diagnosis not present

## 2022-04-21 DIAGNOSIS — Z79621 Long term (current) use of calcineurin inhibitor: Secondary | ICD-10-CM | POA: Diagnosis not present

## 2022-04-21 DIAGNOSIS — Z7952 Long term (current) use of systemic steroids: Secondary | ICD-10-CM | POA: Diagnosis not present

## 2022-04-21 DIAGNOSIS — E785 Hyperlipidemia, unspecified: Secondary | ICD-10-CM | POA: Diagnosis not present

## 2022-04-21 DIAGNOSIS — Z4822 Encounter for aftercare following kidney transplant: Secondary | ICD-10-CM | POA: Diagnosis not present

## 2022-04-21 LAB — HEMOGLOBIN A1C: Hemoglobin A1C: 8.2

## 2022-04-30 DIAGNOSIS — N281 Cyst of kidney, acquired: Secondary | ICD-10-CM | POA: Diagnosis not present

## 2022-04-30 DIAGNOSIS — Z4822 Encounter for aftercare following kidney transplant: Secondary | ICD-10-CM | POA: Diagnosis not present

## 2022-04-30 DIAGNOSIS — Z940 Kidney transplant status: Secondary | ICD-10-CM | POA: Diagnosis not present

## 2022-05-05 DIAGNOSIS — E1122 Type 2 diabetes mellitus with diabetic chronic kidney disease: Secondary | ICD-10-CM | POA: Diagnosis not present

## 2022-05-07 ENCOUNTER — Ambulatory Visit: Payer: Medicaid Other

## 2022-05-07 ENCOUNTER — Inpatient Hospital Stay: Admission: RE | Admit: 2022-05-07 | Payer: Medicaid Other | Source: Ambulatory Visit

## 2022-05-07 DIAGNOSIS — Z4822 Encounter for aftercare following kidney transplant: Secondary | ICD-10-CM | POA: Diagnosis not present

## 2022-05-07 DIAGNOSIS — Z79621 Long term (current) use of calcineurin inhibitor: Secondary | ICD-10-CM | POA: Diagnosis not present

## 2022-05-07 DIAGNOSIS — Z5181 Encounter for therapeutic drug level monitoring: Secondary | ICD-10-CM | POA: Diagnosis not present

## 2022-05-07 LAB — BASIC METABOLIC PANEL
BUN: 35 — AB (ref 4–21)
CO2: 29 — AB (ref 13–22)
Chloride: 106 (ref 99–108)
Creatinine: 2.4 — AB (ref 0.5–1.1)
Glucose: 126
Potassium: 4.2 mEq/L (ref 3.5–5.1)
Sodium: 141 (ref 137–147)

## 2022-05-07 LAB — CBC AND DIFFERENTIAL
HCT: 37 (ref 36–46)
Hemoglobin: 12.6 (ref 12.0–16.0)
Platelets: 205 10*3/uL (ref 150–400)
WBC: 6.4

## 2022-05-07 LAB — CBC: RBC: 4.07 (ref 3.87–5.11)

## 2022-05-07 LAB — HEPATIC FUNCTION PANEL
ALT: 29 U/L (ref 7–35)
AST: 14 (ref 13–35)
Alkaline Phosphatase: 91 (ref 25–125)
Bilirubin, Total: 0.4

## 2022-05-07 LAB — COMPREHENSIVE METABOLIC PANEL
Albumin: 3.3 — AB (ref 3.5–5.0)
Calcium: 8.9 (ref 8.7–10.7)
eGFR: 23

## 2022-05-07 LAB — HEMOGLOBIN A1C: Hemoglobin A1C: 8.2

## 2022-05-19 DIAGNOSIS — Z7982 Long term (current) use of aspirin: Secondary | ICD-10-CM | POA: Diagnosis not present

## 2022-05-19 DIAGNOSIS — Z79899 Other long term (current) drug therapy: Secondary | ICD-10-CM | POA: Diagnosis not present

## 2022-05-19 DIAGNOSIS — B259 Cytomegaloviral disease, unspecified: Secondary | ICD-10-CM | POA: Diagnosis not present

## 2022-05-19 DIAGNOSIS — E1122 Type 2 diabetes mellitus with diabetic chronic kidney disease: Secondary | ICD-10-CM | POA: Diagnosis not present

## 2022-05-19 DIAGNOSIS — Z794 Long term (current) use of insulin: Secondary | ICD-10-CM | POA: Diagnosis not present

## 2022-05-19 DIAGNOSIS — I151 Hypertension secondary to other renal disorders: Secondary | ICD-10-CM | POA: Diagnosis not present

## 2022-05-19 DIAGNOSIS — Z4822 Encounter for aftercare following kidney transplant: Secondary | ICD-10-CM | POA: Diagnosis not present

## 2022-05-19 DIAGNOSIS — Z23 Encounter for immunization: Secondary | ICD-10-CM | POA: Diagnosis not present

## 2022-05-19 DIAGNOSIS — Z940 Kidney transplant status: Secondary | ICD-10-CM | POA: Diagnosis not present

## 2022-05-19 DIAGNOSIS — Z79621 Long term (current) use of calcineurin inhibitor: Secondary | ICD-10-CM | POA: Diagnosis not present

## 2022-05-19 DIAGNOSIS — E119 Type 2 diabetes mellitus without complications: Secondary | ICD-10-CM | POA: Diagnosis not present

## 2022-05-19 DIAGNOSIS — I10 Essential (primary) hypertension: Secondary | ICD-10-CM | POA: Diagnosis not present

## 2022-05-19 DIAGNOSIS — I2510 Atherosclerotic heart disease of native coronary artery without angina pectoris: Secondary | ICD-10-CM | POA: Diagnosis not present

## 2022-05-19 DIAGNOSIS — D849 Immunodeficiency, unspecified: Secondary | ICD-10-CM | POA: Diagnosis not present

## 2022-05-19 DIAGNOSIS — E785 Hyperlipidemia, unspecified: Secondary | ICD-10-CM | POA: Diagnosis not present

## 2022-05-26 NOTE — Patient Instructions (Signed)

## 2022-05-26 NOTE — Progress Notes (Addendum)
I,Victoria T Hamilton,acting as a scribe for Maximino Greenland, MD.,have documented all relevant documentation on the behalf of Maximino Greenland, MD,as directed by  Maximino Greenland, MD while in the presence of Maximino Greenland, MD.    Subjective:     Patient ID: Samantha [name] , female    DOB: July 04, 1965 , 57 y.o.   MRN: [id number]   Chief Complaint  Patient presents with   Hypertension   Diabetes    HPI  Patient is here for a hypertension/DM follow up. She reports compliance with meds. She denies headaches, chest pain and shortness of breath. She is also followed by [facility] and [facility] teams.    She reports completing mammo & pap at [facility]. Letter sent to request results.     Hypertension This is a chronic problem. The current episode started more than 1 year ago. The problem has been gradually improving since onset. The problem is controlled. Pertinent negatives include no blurred vision. Past treatments include ACE inhibitors and diuretics. The current treatment provides moderate improvement. Compliance problems include exercise.  Hypertensive end-organ damage includes kidney disease.  Diabetes She presents for her follow-up diabetic visit. She has type 2 diabetes mellitus. Her disease course has been stable. Pertinent negatives for diabetes include no blurred vision, no polydipsia, no polyphagia and no polyuria. There are no hypoglycemic complications. Symptoms are improving. Diabetic complications include nephropathy. Risk factors for coronary artery disease include diabetes mellitus, dyslipidemia, hypertension, obesity and sedentary lifestyle. She is following a generally healthy diet. She participates in exercise intermittently. Her breakfast blood glucose range is generally 130-140 mg/dl. (120 blood sugar is the highest. Lunch 150-160 Dinner about 200 ) An ACE inhibitor/angiotensin II receptor blocker is contraindicated.     Past Medical History:  Diagnosis  Date   Anemia in chronic kidney disease (CKD)    Chronic kidney disease, stage V (HCC)    Hypertension    Kidney transplanted    Uncontrolled diabetes mellitus with microalbuminuria or microproteinuria      Family History  Problem Relation Age of Onset   Hypertension Mother    Diabetes Mother    Kidney disease Mother    Crohn's disease Mother    Diabetes Father    Hypertension Father    Ulcers Father    Kidney disease Father      Current Outpatient Medications:    atorvastatin (LIPITOR) 80 MG tablet, Take 80 mg by mouth daily., Disp: , Rfl:    insulin glargine (LANTUS SOLOSTAR) 100 UNIT/ML Solostar Pen, Inject 20 Units into the skin daily., Disp: 45 mL, Rfl: 3   insulin lispro (HUMALOG KWIKPEN) 100 UNIT/ML KwikPen, Per sliding scale through [facility] transplant team (Patient taking differently: 0-10 Units. Per sliding scale through [facility] transplant team), Disp: 15 mL, Rfl: 11   meclizine (ANTIVERT) 12.5 MG tablet, Take 1 tablet (12.5 mg total) by mouth 3 (three) times daily as needed for dizziness., Disp: 30 tablet, Rfl: 0   NIFEdipine (PROCARDIA XL/NIFEDICAL XL) 60 MG 24 hr tablet, Take 60 mg by mouth daily., Disp: , Rfl:    OZEMPIC, 0.25 OR 0.5 MG/DOSE, 2 MG/1.5ML SOPN, Inject 0.5 mg into the skin once a week., Disp: 4.5 mL, Rfl: 3   predniSONE (DELTASONE) 5 MG tablet, Take 5 mg by mouth daily. Patient reports taking 2 daily. One in the morning & one at night., Disp: , Rfl:    sodium bicarbonate 650 MG tablet, Take 1,300 mg by mouth  3 (three) times daily., Disp: , Rfl:    tacrolimus (PROGRAF) 1 MG capsule, Take 3-4 mg by mouth See admin instructions. '3mg'$  in the morning, '2mg'$  in the evening, Disp: , Rfl:    cyclobenzaprine (FLEXERIL) 5 MG tablet, TAKE 1 TABLET BY MOUTH AT BEDTIME AS NEEDED FOR BACK/HIP PAIN, Disp: 30 tablet, Rfl: 0   No Known Allergies   Review of Systems  Constitutional: Negative.   Eyes: Negative.  Negative for blurred vision.  Respiratory: Negative.     Cardiovascular: Negative.   Endocrine: Negative for polydipsia, polyphagia and polyuria.  Musculoskeletal: Negative.   Skin: Negative.   Psychiatric/Behavioral: Negative.       Today's Vitals   05/27/22 0957  BP: 136/84  Pulse: 77  Temp: 98.2 F (36.8 C)  SpO2: 98%  Weight: 233 lb (105.7 kg)  Height: '5\' 8"'$  (1.727 m)   Body mass index is 35.43 kg/m.  Wt Readings from Last 3 Encounters:  05/27/22 233 lb (105.7 kg)  03/11/22 220 lb (99.8 kg)  11/24/21 221 lb (100.2 kg)    Objective:  Physical Exam Vitals and nursing note reviewed.  Constitutional:      Appearance: Normal appearance.  HENT:     Head: Normocephalic and atraumatic.     Nose:     Comments: Masked     Mouth/Throat:     Comments: Masked  Eyes:     Extraocular Movements: Extraocular movements intact.  Cardiovascular:     Rate and Rhythm: Normal rate and regular rhythm.     Heart sounds: Normal heart sounds.  Pulmonary:     Effort: Pulmonary effort is normal.     Breath sounds: Normal breath sounds.  Musculoskeletal:     Cervical back: Normal range of motion.  Skin:    General: Skin is warm.  Neurological:     General: No focal deficit present.     Mental Status: She is alert.  Psychiatric:        Mood and Affect: Mood normal.        Behavior: Behavior normal.         Assessment And Plan:     1. Hypertensive nephropathy Comments: Chronic, not at goal. Goal BP<130/80. She is on Procardia XL as per transplant team. Encouraged to increase exercise, will add meds if persistently high.  She is also reminded to follow a low sodium diet.  - Lipid panel; Future  2. Type 2 diabetes mellitus with diabetic nephropathy, with long-term current use of insulin (HCC) Comments: Chronic, she was in stage 4 CKD prior to transplant. Labs reviewed from [facility], a1c 8.2 in Dec 2023. - Lipid panel; Future  3. Class 2 obesity due to excess calories without serious comorbidity with body mass index (BMI) of  35.0 to 35.9 in adult Comments: She is encouraged to strive for BMI less than 30 to decrease cardiac risk. Advised to aim for at least 150 minutes of exercise per week.  4. Renal transplant, status post  5. Chronic kidney disease, stage IV (severe) ([place]) Comments: This has persisted post renal transplant. She is also followed by [facility] team.   Patient was given opportunity to ask questions. Patient verbalized understanding of the plan and was able to repeat key elements of the plan. All questions were answered to their satisfaction.   I, Maximino Greenland, MD, have reviewed all documentation for this visit. The documentation on 05/30/22 for the exam, diagnosis, procedures, and orders are all accurate and complete.  IF YOU HAVE BEEN REFERRED TO A SPECIALIST, IT MAY TAKE 1-2 WEEKS TO SCHEDULE/PROCESS THE REFERRAL. IF YOU HAVE NOT HEARD FROM US/SPECIALIST IN TWO WEEKS, PLEASE GIVE [place] A CALL AT [phone].   THE PATIENT IS ENCOURAGED TO PRACTICE SOCIAL DISTANCING DUE TO THE COVID-19 PANDEMIC.

## 2022-05-27 ENCOUNTER — Encounter: Payer: Self-pay | Admitting: Internal Medicine

## 2022-05-27 ENCOUNTER — Other Ambulatory Visit: Payer: Self-pay | Admitting: Internal Medicine

## 2022-05-27 ENCOUNTER — Ambulatory Visit (INDEPENDENT_AMBULATORY_CARE_PROVIDER_SITE_OTHER): Payer: Medicare HMO | Admitting: Internal Medicine

## 2022-05-27 VITALS — BP 136/84 | HR 77 | Temp 98.2°F | Ht 68.0 in | Wt 233.0 lb

## 2022-05-27 DIAGNOSIS — Z940 Kidney transplant status: Secondary | ICD-10-CM | POA: Diagnosis not present

## 2022-05-27 DIAGNOSIS — E1122 Type 2 diabetes mellitus with diabetic chronic kidney disease: Secondary | ICD-10-CM | POA: Diagnosis not present

## 2022-05-27 DIAGNOSIS — Z794 Long term (current) use of insulin: Secondary | ICD-10-CM

## 2022-05-27 DIAGNOSIS — I129 Hypertensive chronic kidney disease with stage 1 through stage 4 chronic kidney disease, or unspecified chronic kidney disease: Secondary | ICD-10-CM

## 2022-05-27 DIAGNOSIS — E6609 Other obesity due to excess calories: Secondary | ICD-10-CM

## 2022-05-27 DIAGNOSIS — N184 Chronic kidney disease, stage 4 (severe): Secondary | ICD-10-CM | POA: Diagnosis not present

## 2022-05-27 DIAGNOSIS — E1121 Type 2 diabetes mellitus with diabetic nephropathy: Secondary | ICD-10-CM

## 2022-05-27 DIAGNOSIS — Z6835 Body mass index (BMI) 35.0-35.9, adult: Secondary | ICD-10-CM

## 2022-05-28 ENCOUNTER — Encounter: Payer: Self-pay | Admitting: Internal Medicine

## 2022-05-30 DIAGNOSIS — Z794 Long term (current) use of insulin: Secondary | ICD-10-CM | POA: Insufficient documentation

## 2022-05-30 DIAGNOSIS — E6609 Other obesity due to excess calories: Secondary | ICD-10-CM | POA: Insufficient documentation

## 2022-05-30 DIAGNOSIS — I129 Hypertensive chronic kidney disease with stage 1 through stage 4 chronic kidney disease, or unspecified chronic kidney disease: Secondary | ICD-10-CM | POA: Insufficient documentation

## 2022-05-30 DIAGNOSIS — E1121 Type 2 diabetes mellitus with diabetic nephropathy: Secondary | ICD-10-CM | POA: Insufficient documentation

## 2022-06-02 DIAGNOSIS — N186 End stage renal disease: Secondary | ICD-10-CM | POA: Diagnosis not present

## 2022-06-02 DIAGNOSIS — N184 Chronic kidney disease, stage 4 (severe): Secondary | ICD-10-CM | POA: Diagnosis not present

## 2022-06-02 DIAGNOSIS — E1122 Type 2 diabetes mellitus with diabetic chronic kidney disease: Secondary | ICD-10-CM | POA: Diagnosis not present

## 2022-06-02 DIAGNOSIS — Z940 Kidney transplant status: Secondary | ICD-10-CM | POA: Diagnosis not present

## 2022-06-11 ENCOUNTER — Encounter: Payer: Self-pay | Admitting: Internal Medicine

## 2022-06-16 DIAGNOSIS — Z4822 Encounter for aftercare following kidney transplant: Secondary | ICD-10-CM | POA: Diagnosis not present

## 2022-06-30 DIAGNOSIS — Z794 Long term (current) use of insulin: Secondary | ICD-10-CM | POA: Diagnosis not present

## 2022-06-30 DIAGNOSIS — Z7985 Long-term (current) use of injectable non-insulin antidiabetic drugs: Secondary | ICD-10-CM | POA: Diagnosis not present

## 2022-06-30 DIAGNOSIS — E1165 Type 2 diabetes mellitus with hyperglycemia: Secondary | ICD-10-CM | POA: Diagnosis not present

## 2022-06-30 DIAGNOSIS — Z7952 Long term (current) use of systemic steroids: Secondary | ICD-10-CM | POA: Diagnosis not present

## 2022-06-30 DIAGNOSIS — Z940 Kidney transplant status: Secondary | ICD-10-CM | POA: Diagnosis not present

## 2022-07-14 DIAGNOSIS — I10 Essential (primary) hypertension: Secondary | ICD-10-CM | POA: Diagnosis not present

## 2022-07-14 DIAGNOSIS — Z5181 Encounter for therapeutic drug level monitoring: Secondary | ICD-10-CM | POA: Diagnosis not present

## 2022-07-14 DIAGNOSIS — D849 Immunodeficiency, unspecified: Secondary | ICD-10-CM | POA: Diagnosis not present

## 2022-07-14 DIAGNOSIS — Z940 Kidney transplant status: Secondary | ICD-10-CM | POA: Diagnosis not present

## 2022-07-14 DIAGNOSIS — B259 Cytomegaloviral disease, unspecified: Secondary | ICD-10-CM | POA: Diagnosis not present

## 2022-07-14 DIAGNOSIS — E1122 Type 2 diabetes mellitus with diabetic chronic kidney disease: Secondary | ICD-10-CM | POA: Diagnosis not present

## 2022-07-14 DIAGNOSIS — Z79621 Long term (current) use of calcineurin inhibitor: Secondary | ICD-10-CM | POA: Diagnosis not present

## 2022-07-14 DIAGNOSIS — Z79899 Other long term (current) drug therapy: Secondary | ICD-10-CM | POA: Diagnosis not present

## 2022-08-12 DIAGNOSIS — Z940 Kidney transplant status: Secondary | ICD-10-CM | POA: Diagnosis not present

## 2022-09-15 DIAGNOSIS — Z940 Kidney transplant status: Secondary | ICD-10-CM | POA: Diagnosis not present

## 2022-09-27 DIAGNOSIS — E1122 Type 2 diabetes mellitus with diabetic chronic kidney disease: Secondary | ICD-10-CM | POA: Diagnosis not present

## 2022-10-05 ENCOUNTER — Other Ambulatory Visit: Payer: Self-pay | Admitting: Internal Medicine

## 2022-10-14 DIAGNOSIS — Z940 Kidney transplant status: Secondary | ICD-10-CM | POA: Diagnosis not present

## 2022-10-25 DIAGNOSIS — E1165 Type 2 diabetes mellitus with hyperglycemia: Secondary | ICD-10-CM | POA: Diagnosis not present

## 2022-10-25 DIAGNOSIS — Z7952 Long term (current) use of systemic steroids: Secondary | ICD-10-CM | POA: Diagnosis not present

## 2022-10-25 DIAGNOSIS — Z794 Long term (current) use of insulin: Secondary | ICD-10-CM | POA: Diagnosis not present

## 2022-10-25 DIAGNOSIS — I10 Essential (primary) hypertension: Secondary | ICD-10-CM | POA: Diagnosis not present

## 2022-10-25 DIAGNOSIS — R635 Abnormal weight gain: Secondary | ICD-10-CM | POA: Diagnosis not present

## 2022-10-25 DIAGNOSIS — Z940 Kidney transplant status: Secondary | ICD-10-CM | POA: Diagnosis not present

## 2022-10-25 DIAGNOSIS — Z6837 Body mass index (BMI) 37.0-37.9, adult: Secondary | ICD-10-CM | POA: Diagnosis not present

## 2022-11-05 ENCOUNTER — Ambulatory Visit
Admission: RE | Admit: 2022-11-05 | Discharge: 2022-11-05 | Disposition: A | Discharge status: Home / Self Care | Payer: Medicare HMO | Source: Ambulatory Visit | Attending: Internal Medicine | Admitting: Internal Medicine

## 2022-11-05 DIAGNOSIS — Z0000 Encounter for general adult medical examination without abnormal findings: Secondary | ICD-10-CM

## 2022-11-05 DIAGNOSIS — E2839 Other primary ovarian failure: Secondary | ICD-10-CM

## 2022-11-05 DIAGNOSIS — M8588 Other specified disorders of bone density and structure, other site: Secondary | ICD-10-CM | POA: Diagnosis not present

## 2022-11-05 DIAGNOSIS — Z940 Kidney transplant status: Secondary | ICD-10-CM

## 2022-11-05 DIAGNOSIS — N958 Other specified menopausal and perimenopausal disorders: Secondary | ICD-10-CM | POA: Diagnosis not present

## 2022-11-12 ENCOUNTER — Encounter: Payer: Self-pay | Admitting: Family Medicine

## 2022-11-12 ENCOUNTER — Ambulatory Visit (INDEPENDENT_AMBULATORY_CARE_PROVIDER_SITE_OTHER): Payer: Medicare HMO | Admitting: Family Medicine

## 2022-11-12 VITALS — BP 136/80 | HR 77 | Temp 98.3°F | Ht 68.0 in | Wt 247.4 lb

## 2022-11-12 DIAGNOSIS — E1121 Type 2 diabetes mellitus with diabetic nephropathy: Secondary | ICD-10-CM

## 2022-11-12 DIAGNOSIS — H5789 Other specified disorders of eye and adnexa: Secondary | ICD-10-CM | POA: Insufficient documentation

## 2022-11-12 DIAGNOSIS — H00015 Hordeolum externum left lower eyelid: Secondary | ICD-10-CM | POA: Diagnosis not present

## 2022-11-12 DIAGNOSIS — H1011 Acute atopic conjunctivitis, right eye: Secondary | ICD-10-CM

## 2022-11-12 DIAGNOSIS — Z794 Long term (current) use of insulin: Secondary | ICD-10-CM | POA: Diagnosis not present

## 2022-11-12 MED ORDER — AZELASTINE HCL 0.05 % OP SOLN
1.0000 [drp] | Freq: Two times a day (BID) | OPHTHALMIC | 1 refills | Status: DC
Start: 2022-11-12 — End: 2023-07-05

## 2022-11-12 MED ORDER — BACITRACIN-POLYMYXIN B 500-10000 UNIT/GM OP OINT
1.0000 | TOPICAL_OINTMENT | Freq: Two times a day (BID) | OPHTHALMIC | 0 refills | Status: DC
Start: 2022-11-12 — End: 2023-08-29

## 2022-11-12 NOTE — Progress Notes (Signed)
I,Victoria T Hamilton, CMA,acting as a Neurosurgeon for [employer], NP.,have documented all relevant documentation on the behalf of Tiarna Koppen, NP,as directed by  Shandiin Eisenbeis Moshe Salisbury, NP while in the presence of Jantzen Pilger, NP.  Subjective:  Patient ID: Samantha [name] , female    DOB: December 06, 1965 , 57 y.o.   MRN: [id number]  Chief Complaint  Patient presents with   Stye    HPI  Patient presents today for a Stye on both eyes. Patient has a stye on her left lower eye lid and on her right upper eye lid. Patient reports it does itch, and mild pain. Patient reports it first started Monday. BP Readings from Last 3 Encounters: 11/12/22 : (!) 140/70 05/27/22 : 136/84 11/24/21 : (!) 142/70       Past Medical History:  Diagnosis Date   Anemia in chronic kidney disease (CKD)    Chronic kidney disease, stage V (HCC)    Hypertension    Kidney transplanted    Uncontrolled diabetes mellitus with microalbuminuria or microproteinuria      Family History  Problem Relation Age of Onset   Hypertension Mother    Diabetes Mother    Kidney disease Mother    Crohn's disease Mother    Diabetes Father    Hypertension Father    Ulcers Father    Kidney disease Father      Current Outpatient Medications:    atorvastatin (LIPITOR) 80 MG tablet, Take 80 mg by mouth daily., Disp: , Rfl:    azelastine (OPTIVAR) 0.05 % ophthalmic solution, Place 1 drop into both eyes 2 (two) times daily., Disp: 6 mL, Rfl: 1   bacitracin-polymyxin b (POLYSPORIN) ophthalmic ointment, Place 1 Application into both eyes every 12 (twelve) hours. apply to eye every 12 hours while awake, Disp: 3.5 g, Rfl: 0   cyclobenzaprine (FLEXERIL) 5 MG tablet, TAKE 1 TABLET BY MOUTH AT BEDTIME AS NEEDED FOR BACK/HIP PAIN, Disp: 30 tablet, Rfl: 0   insulin glargine (LANTUS SOLOSTAR) 100 UNIT/ML Solostar Pen, Inject 20 Units into the skin daily., Disp: 45 mL, Rfl: 3   insulin lispro (HUMALOG KWIKPEN) 100 UNIT/ML KwikPen, Per sliding scale  through [facility] transplant team (Patient taking differently: 0-10 Units. Per sliding scale through [facility] transplant team), Disp: 15 mL, Rfl: 11   meclizine (ANTIVERT) 12.5 MG tablet, Take 1 tablet (12.5 mg total) by mouth 3 (three) times daily as needed for dizziness., Disp: 30 tablet, Rfl: 0   MOUNJARO 2.5 MG/0.5ML Pen, Inject 2.5 mg into the skin once a week., Disp: , Rfl:    NIFEdipine (PROCARDIA XL/NIFEDICAL XL) 60 MG 24 hr tablet, Take 60 mg by mouth daily., Disp: , Rfl:    predniSONE (DELTASONE) 5 MG tablet, Take 10 mg by mouth daily. Patient reports taking 2 daily. One in the morning & one at night., Disp: , Rfl:    sodium bicarbonate 650 MG tablet, Take 1,300 mg by mouth 3 (three) times daily., Disp: , Rfl:    tacrolimus (PROGRAF) 1 MG capsule, Take 3-4 mg by mouth See admin instructions. 3mg  in the morning, 2mg  in the evening, Disp: , Rfl:    No Known Allergies   Review of Systems  Constitutional: Negative.   HENT: Negative.    Eyes:  Positive for discharge, redness and itching. Negative for photophobia, pain and visual disturbance.  Respiratory: Negative.    Allergic/Immunologic: Positive for environmental allergies.     Today's Vitals   11/12/22 1117 11/12/22 1154  BP: Marland Kitchen)  140/70 136/80  Pulse: 77   Temp: 98.3 F (36.8 C)   TempSrc: Oral   Weight: 247 lb 6.4 oz (112.2 kg)   Height: 5\' 8"  (1.727 m)   PainSc: 0-No pain    Body mass index is 37.62 kg/m.  Wt Readings from Last 3 Encounters:  11/12/22 247 lb 6.4 oz (112.2 kg)  05/27/22 233 lb (105.7 kg)  03/11/22 220 lb (99.8 kg)     Objective:  Physical Exam Constitutional:      Appearance: Normal appearance.  Pulmonary:     Effort: Pulmonary effort is normal. No respiratory distress.  Neurological:     General: No focal deficit present.     Mental Status: She is alert and oriented to person, place, and time. Mental status is at baseline.  Psychiatric:        Mood and Affect: Mood normal.          Assessment And Plan:  Hordeolum externum of left lower eyelid Assessment & Plan: Use warm compress to eye as needed  Orders: -     Bacitracin-Polymyxin B; Place 1 Application into both eyes every 12 (twelve) hours. apply to eye every 12 hours while awake  Dispense: 3.5 g; Refill: 0  Conjunctivitis, allergic, right Assessment & Plan: Use eye drops as directed  Orders: -     Azelastine HCl; Place 1 drop into both eyes 2 (two) times daily.  Dispense: 6 mL; Refill: 1  Type 2 diabetes mellitus with diabetic nephropathy, with long-term current use of insulin (HCC)     Return if symptoms worsen or fail to improve, for Keep previously scheduled appt.  Patient was given opportunity to ask questions. Patient verbalized understanding of the plan and was able to repeat key elements of the plan. All questions were answered to their satisfaction.  Ruairi Stutsman Moshe Salisbury, NP  I, Jann Milkovich Moshe Salisbury, NP, have reviewed all documentation for this visit. The documentation on 11/22/22 for the exam, diagnosis, procedures, and orders are all accurate and complete.   IF YOU HAVE BEEN REFERRED TO A SPECIALIST, IT MAY TAKE 1-2 WEEKS TO SCHEDULE/PROCESS THE REFERRAL. IF YOU HAVE NOT HEARD FROM US/SPECIALIST IN TWO WEEKS, PLEASE GIVE [place] A CALL AT [phone].   THE PATIENT IS ENCOURAGED TO PRACTICE SOCIAL DISTANCING DUE TO THE COVID-19 PANDEMIC.

## 2022-11-16 DIAGNOSIS — Z5181 Encounter for therapeutic drug level monitoring: Secondary | ICD-10-CM | POA: Diagnosis not present

## 2022-11-16 DIAGNOSIS — B259 Cytomegaloviral disease, unspecified: Secondary | ICD-10-CM | POA: Diagnosis not present

## 2022-11-16 DIAGNOSIS — I151 Hypertension secondary to other renal disorders: Secondary | ICD-10-CM | POA: Diagnosis not present

## 2022-11-16 DIAGNOSIS — Z79621 Long term (current) use of calcineurin inhibitor: Secondary | ICD-10-CM | POA: Diagnosis not present

## 2022-11-16 DIAGNOSIS — Z940 Kidney transplant status: Secondary | ICD-10-CM | POA: Diagnosis not present

## 2022-11-16 DIAGNOSIS — Z79899 Other long term (current) drug therapy: Secondary | ICD-10-CM | POA: Diagnosis not present

## 2022-11-22 ENCOUNTER — Encounter: Payer: Self-pay | Admitting: Family Medicine

## 2022-11-22 NOTE — Assessment & Plan Note (Signed)
Use warm compress to eye as needed

## 2022-11-22 NOTE — Assessment & Plan Note (Signed)
Use eye drops as directed.

## 2022-11-30 ENCOUNTER — Encounter: Payer: Medicare HMO | Admitting: Internal Medicine

## 2022-12-04 ENCOUNTER — Other Ambulatory Visit: Payer: Self-pay | Admitting: Family Medicine

## 2022-12-04 DIAGNOSIS — H1011 Acute atopic conjunctivitis, right eye: Secondary | ICD-10-CM

## 2022-12-06 ENCOUNTER — Other Ambulatory Visit: Payer: Self-pay | Admitting: Internal Medicine

## 2022-12-14 DIAGNOSIS — B259 Cytomegaloviral disease, unspecified: Secondary | ICD-10-CM | POA: Diagnosis not present

## 2022-12-14 DIAGNOSIS — Z940 Kidney transplant status: Secondary | ICD-10-CM | POA: Diagnosis not present

## 2022-12-14 DIAGNOSIS — I10 Essential (primary) hypertension: Secondary | ICD-10-CM | POA: Diagnosis not present

## 2022-12-14 DIAGNOSIS — D849 Immunodeficiency, unspecified: Secondary | ICD-10-CM | POA: Diagnosis not present

## 2022-12-14 DIAGNOSIS — Z79899 Other long term (current) drug therapy: Secondary | ICD-10-CM | POA: Diagnosis not present

## 2022-12-14 DIAGNOSIS — Z794 Long term (current) use of insulin: Secondary | ICD-10-CM | POA: Diagnosis not present

## 2022-12-14 DIAGNOSIS — E119 Type 2 diabetes mellitus without complications: Secondary | ICD-10-CM | POA: Diagnosis not present

## 2022-12-16 ENCOUNTER — Encounter: Payer: Self-pay | Admitting: Internal Medicine

## 2022-12-16 ENCOUNTER — Ambulatory Visit (INDEPENDENT_AMBULATORY_CARE_PROVIDER_SITE_OTHER): Payer: Medicare HMO | Admitting: Internal Medicine

## 2022-12-16 VITALS — BP 128/72 | HR 79 | Temp 98.2°F | Wt 248.4 lb

## 2022-12-16 DIAGNOSIS — N184 Chronic kidney disease, stage 4 (severe): Secondary | ICD-10-CM

## 2022-12-16 DIAGNOSIS — E1121 Type 2 diabetes mellitus with diabetic nephropathy: Secondary | ICD-10-CM | POA: Diagnosis not present

## 2022-12-16 DIAGNOSIS — L304 Erythema intertrigo: Secondary | ICD-10-CM | POA: Diagnosis not present

## 2022-12-16 DIAGNOSIS — Z0000 Encounter for general adult medical examination without abnormal findings: Secondary | ICD-10-CM | POA: Diagnosis not present

## 2022-12-16 DIAGNOSIS — Z940 Kidney transplant status: Secondary | ICD-10-CM | POA: Diagnosis not present

## 2022-12-16 DIAGNOSIS — Z794 Long term (current) use of insulin: Secondary | ICD-10-CM | POA: Diagnosis not present

## 2022-12-16 DIAGNOSIS — E1122 Type 2 diabetes mellitus with diabetic chronic kidney disease: Secondary | ICD-10-CM | POA: Diagnosis not present

## 2022-12-16 DIAGNOSIS — I129 Hypertensive chronic kidney disease with stage 1 through stage 4 chronic kidney disease, or unspecified chronic kidney disease: Secondary | ICD-10-CM | POA: Diagnosis not present

## 2022-12-16 DIAGNOSIS — Z6837 Body mass index (BMI) 37.0-37.9, adult: Secondary | ICD-10-CM

## 2022-12-16 LAB — POCT URINALYSIS DIPSTICK
Bilirubin, UA: NEGATIVE
Blood, UA: NEGATIVE
Glucose, UA: NEGATIVE
Ketones, UA: NEGATIVE
Leukocytes, UA: NEGATIVE
Nitrite, UA: NEGATIVE
Protein, UA: POSITIVE — AB
Spec Grav, UA: >=1.03 — AB (ref 1.010–1.025)
Urobilinogen, UA: 0.2 E.U./dL
pH, UA: 6.5 (ref 5.0–8.0)

## 2022-12-16 NOTE — Progress Notes (Addendum)
I,Samantha [name], CMA,acting as a Neurosurgeon for Samantha Aliment, MD.,have documented all relevant documentation on the behalf of Samantha Aliment, MD,as directed by  Samantha Aliment, MD while in the presence of Samantha Aliment, MD.  Subjective:  Patient ID: Samantha [name] , female    DOB: 10-28-1965 , 57 y.o.   MRN: [id number]  Chief Complaint  Patient presents with   Annual Exam   Diabetes   Hypertension    HPI  She is here tdoay for a full physical exam. She is followed by Dr. Duanne Moron for her GYN exams. She reports compliance with medications. Denies headache, chest pain, and SOB. She reports no specific questions or concerns.   She admits not yet completing DM eye exam.   Hypertension This is a chronic problem. The current episode started more than 1 year ago. The problem has been gradually improving since onset. The problem is controlled. Pertinent negatives include no blurred vision. Past treatments include ACE inhibitors and diuretics. The current treatment provides moderate improvement. Compliance problems include exercise.  Hypertensive end-organ damage includes kidney disease.  Diabetes She presents for her follow-up diabetic visit. She has type 2 diabetes mellitus. Her disease course has been stable. There are no hypoglycemic associated symptoms. Pertinent negatives for diabetes include no blurred vision. There are no hypoglycemic complications. Symptoms are improving. Diabetic complications include nephropathy. Risk factors for coronary artery disease include diabetes mellitus, dyslipidemia, hypertension, obesity and sedentary lifestyle. She is following a generally healthy diet. She participates in exercise intermittently. Her breakfast blood glucose range is generally 130-140 mg/dl. An ACE inhibitor/angiotensin II receptor blocker is contraindicated.     Past Medical History:  Diagnosis Date   Anemia in chronic kidney disease (CKD)    Chronic kidney disease, stage V  (HCC)    Hypertension    Kidney transplanted    Uncontrolled diabetes mellitus with microalbuminuria or microproteinuria      Family History  Problem Relation Age of Onset   Hypertension Mother    Diabetes Mother    Kidney disease Mother    Crohn's disease Mother    Diabetes Father    Hypertension Father    Ulcers Father    Kidney disease Father      Current Outpatient Medications:    atorvastatin (LIPITOR) 80 MG tablet, Take 80 mg by mouth daily., Disp: , Rfl:    bacitracin-polymyxin b (POLYSPORIN) ophthalmic ointment, Place 1 Application into both eyes every 12 (twelve) hours. apply to eye every 12 hours while awake, Disp: 3.5 g, Rfl: 0   cyclobenzaprine (FLEXERIL) 5 MG tablet, TAKE 1 TABLET BY MOUTH AT BEDTIME AS NEEDED FOR BACK/HIP PAIN, Disp: 30 tablet, Rfl: 0   insulin glargine (LANTUS SOLOSTAR) 100 UNIT/ML Solostar Pen, Inject 20 Units into the skin daily., Disp: 45 mL, Rfl: 3   insulin lispro (HUMALOG KWIKPEN) 100 UNIT/ML KwikPen, Per sliding scale through [facility] transplant team (Patient taking differently: 0-10 Units. Per sliding scale through [facility] transplant team), Disp: 15 mL, Rfl: 11   meclizine (ANTIVERT) 12.5 MG tablet, Take 1 tablet (12.5 mg total) by mouth 3 (three) times daily as needed for dizziness., Disp: 30 tablet, Rfl: 0   MOUNJARO 2.5 MG/0.5ML Pen, Inject 2.5 mg into the skin once a week., Disp: , Rfl:    NIFEdipine (PROCARDIA XL/NIFEDICAL XL) 60 MG 24 hr tablet, Take 60 mg by mouth daily., Disp: , Rfl:    nystatin cream (MYCOSTATIN), Apply 1 Application topically 2 (two)  times daily. prn, Disp: 30 g, Rfl: 1   predniSONE (DELTASONE) 5 MG tablet, Take 10 mg by mouth daily. Patient reports taking 2 daily. One in the morning & one at night., Disp: , Rfl:    sodium bicarbonate 650 MG tablet, Take 1,300 mg by mouth 3 (three) times daily., Disp: , Rfl:    tacrolimus (PROGRAF) 1 MG capsule, Take 3-4 mg by mouth See admin instructions. 3mg  in the morning, 2mg   in the evening, Disp: , Rfl:    azelastine (OPTIVAR) 0.05 % ophthalmic solution, Place 1 drop into both eyes 2 (two) times daily. (Patient not taking: Reported on 12/16/2022), Disp: 6 mL, Rfl: 1   No Known Allergies    The patient states she uses none for birth control. Last LMP was No LMP recorded. Patient has had an ablation.. Negative for Dysmenorrhea Negative for: breast discharge, breast lump(s), breast pain and breast self exam. Associated symptoms include abnormal vaginal bleeding. Pertinent negatives include abnormal bleeding (hematology), anxiety, decreased libido, depression, difficulty falling sleep, dyspareunia, history of infertility, nocturia, sexual dysfunction, sleep disturbances, urinary incontinence, urinary urgency, vaginal discharge and vaginal itching. Diet regular.The patient states her exercise level is    . The patient's tobacco use is:  Social History   Tobacco Use  Smoking Status Never  Smokeless Tobacco Never  . She has been exposed to passive smoke. The patient's alcohol use is:  Social History   Substance and Sexual Activity  Alcohol Use No    Review of Systems  Constitutional: Negative.   HENT: Negative.    Eyes: Negative.  Negative for blurred vision.  Respiratory: Negative.    Cardiovascular: Negative.   Gastrointestinal: Negative.   Endocrine: Negative.   Genitourinary: Negative.   Musculoskeletal: Negative.   Skin: Negative.   Allergic/Immunologic: Negative.   Neurological: Negative.   Hematological: Negative.   Psychiatric/Behavioral: Negative.       Today's Vitals   12/16/22 1108  BP: 128/72  Pulse: 79  Temp: 98.2 F (36.8 C)  SpO2: 98%  Weight: 248 lb 6.4 oz (112.7 kg)   Body mass index is 37.77 kg/m.  Wt Readings from Last 3 Encounters:  12/16/22 248 lb 6.4 oz (112.7 kg)  11/12/22 247 lb 6.4 oz (112.2 kg)  05/27/22 233 lb (105.7 kg)     Objective:  Physical Exam Vitals and nursing note reviewed.  Constitutional:       Appearance: Normal appearance. She is obese.  HENT:     Head: Normocephalic and atraumatic.     Right Ear: Tympanic membrane, ear canal and external ear normal.     Left Ear: Tympanic membrane, ear canal and external ear normal.     Nose: Nose normal.     Mouth/Throat:     Mouth: Mucous membranes are moist.     Pharynx: Oropharynx is clear.  Eyes:     Extraocular Movements: Extraocular movements intact.     Conjunctiva/sclera: Conjunctivae normal.     Pupils: Pupils are equal, round, and reactive to light.  Cardiovascular:     Rate and Rhythm: Normal rate and regular rhythm.     Pulses:          Dorsalis pedis pulses are 1+ on the right side and 1+ on the left side.     Heart sounds: Normal heart sounds.  Pulmonary:     Effort: Pulmonary effort is normal.     Breath sounds: Normal breath sounds.  Chest:  Breasts:    Tanner Score is  5.     Right: Normal.     Left: Normal.  Abdominal:     General: Bowel sounds are normal.     Palpations: Abdomen is soft.     Comments: Healed surgical scar RLQ Obese, soft, NABS  Genitourinary:    Comments: deferred Musculoskeletal:        General: Normal range of motion.     Cervical back: Normal range of motion and neck supple.  Feet:     Right foot:     Protective Sensation: 5 sites tested.  5 sites sensed.     Skin integrity: Dry skin present.     Toenail Condition: Right toenails are abnormally thick.     Left foot:     Protective Sensation: 5 sites tested.  5 sites sensed.     Skin integrity: Dry skin present.     Toenail Condition: Left toenails are abnormally thick.  Skin:    General: Skin is warm and dry.     Findings: Erythema and rash present.     Comments: Erythematous rash underneath both breasts No vesicular lesions noted  Neurological:     General: No focal deficit present.     Mental Status: She is alert and oriented to person, place, and time.  Psychiatric:        Mood and Affect: Mood normal.        Behavior:  Behavior normal.         Assessment And Plan:  Encounter for general adult medical examination w/o abnormal findings Assessment & Plan: A full exam was performed.  Importance of monthly self breast exams was discussed with the patient.  She is advised to get 3045 minutes of regular exercise, no less than four to five days per week. Both weight-bearing and aerobic exercises are recommended.  She is advised to follow a healthy diet with at least six fruits/veggies per day, decrease intake of red meat and other saturated fats and to increase fish intake to twice weekly.  Meats/fish should not be fried -- baked, boiled or broiled is preferable. It is also important to cut back on your sugar intake.  Be sure to read labels - try to avoid anything with added sugar, high fructose corn syrup or other sweeteners.  If you must use a sweetener, you can try stevia or monkfruit.  It is also important to avoid artificially sweetened foods/beverages and diet drinks. Lastly, wear SPF 50 sunscreen on exposed skin and when in direct sunlight for an extended period of time.  Be sure to avoid fast food restaurants and aim for at least 60 ounces of water daily.       Type 2 diabetes mellitus with diabetic nephropathy, with long-term current use of insulin (HCC) Assessment & Plan: Chronic, diabetic foot exam was performed. She is now followed by Transplant team at [facility]. I DISCUSSED WITH THE PATIENT AT LENGTH REGARDING THE GOALS OF GLYCEMIC CONTROL AND POSSIBLE LONG-TERM COMPLICATIONS.  I  ALSO STRESSED THE IMPORTANCE OF COMPLIANCE WITH HOME GLUCOSE MONITORING, DIETARY RESTRICTIONS INCLUDING AVOIDANCE OF SUGARY DRINKS/PROCESSED FOODS,  ALONG WITH REGULAR EXERCISE.  I  ALSO STRESSED THE IMPORTANCE OF ANNUAL EYE EXAMS, SELF FOOT CARE AND COMPLIANCE WITH OFFICE VISITS.   Orders: -     POCT urinalysis dipstick -     Microalbumin / creatinine urine ratio -     EKG 12-Lead -     Lipid panel -     Hemoglobin  A1c -  Ambulatory referral to Ophthalmology -     TSH  Hypertensive nephropathy Assessment & Plan: Chronic, fair control. Goal BP<120/80.  EKG performed, NSR w/ voltage criteria for LVH and Inferior infarct -probably not recent.  She will continue with nifedipine per transplant team. She is encouraged to follow low sodium diet.   Orders: -     POCT urinalysis dipstick -     Microalbumin / creatinine urine ratio -     EKG 12-Lead -     Lipid panel -     TSH  Chronic kidney disease, stage IV (severe) (HCC) Assessment & Plan: Chronic, she is encouraged to stay well hydrated, avoid NSAIDs and keep BP/BS controlled to prevent progression of CKD.     Intertrigo Assessment & Plan: Located underneath bilateral breasts. I will send rx nystatin cream to apply to affected area twice daily as needed. If persistent, she may consider Gold Bond powder or nystatin powder.    Class 2 severe obesity due to excess calories with serious comorbidity and body mass index (BMI) of 37.0 to 37.9 in adult [facility]) Assessment & Plan: She is encouraged to strive for BMI less than 30 to decrease cardiac risk. Advised to aim for at least 150 minutes of exercise per week.    Renal transplant, status post  Other orders -     Nystatin; Apply 1 Application topically 2 (two) times daily. prn  Dispense: 30 g; Refill: 1  ADDENDUM: patient has been using her Dexcom regularly, she acknolwedges benefit from   Return for 1 year physical, 6 month bp.  Patient was given opportunity to ask questions. Patient verbalized understanding of the plan and was able to repeat key elements of the plan. All questions were answered to their satisfaction.    I, Samantha Aliment, MD, have reviewed all documentation for this visit. The documentation on 12/16/22 for the exam, diagnosis, procedures, and orders are all accurate and complete.   IF YOU HAVE BEEN REFERRED TO A SPECIALIST, IT MAY TAKE 1-2 WEEKS TO SCHEDULE/PROCESS THE  REFERRAL. IF YOU HAVE NOT HEARD FROM US/SPECIALIST IN TWO WEEKS, PLEASE GIVE [place] A CALL AT [phone].   THE PATIENT IS ENCOURAGED TO PRACTICE SOCIAL DISTANCING DUE TO THE COVID-19 PANDEMIC.

## 2022-12-16 NOTE — Patient Instructions (Signed)

## 2022-12-22 DIAGNOSIS — R7989 Other specified abnormal findings of blood chemistry: Secondary | ICD-10-CM | POA: Diagnosis not present

## 2022-12-22 DIAGNOSIS — Z940 Kidney transplant status: Secondary | ICD-10-CM | POA: Diagnosis not present

## 2022-12-27 DIAGNOSIS — N184 Chronic kidney disease, stage 4 (severe): Secondary | ICD-10-CM | POA: Insufficient documentation

## 2022-12-27 DIAGNOSIS — L304 Erythema intertrigo: Secondary | ICD-10-CM | POA: Insufficient documentation

## 2022-12-27 DIAGNOSIS — Z0000 Encounter for general adult medical examination without abnormal findings: Secondary | ICD-10-CM | POA: Insufficient documentation

## 2022-12-27 DIAGNOSIS — R635 Abnormal weight gain: Secondary | ICD-10-CM | POA: Diagnosis not present

## 2022-12-27 DIAGNOSIS — Z940 Kidney transplant status: Secondary | ICD-10-CM | POA: Diagnosis not present

## 2022-12-27 DIAGNOSIS — E66812 Obesity, class 2: Secondary | ICD-10-CM | POA: Insufficient documentation

## 2022-12-27 MED ORDER — NYSTATIN 100000 UNIT/GM EX CREA: 1.0000 | TOPICAL_CREAM | Freq: Two times a day (BID) | CUTANEOUS | 1 refills | Status: DC

## 2022-12-27 NOTE — Assessment & Plan Note (Signed)
She is encouraged to strive for BMI less than 30 to decrease cardiac risk. Advised to aim for at least 150 minutes of exercise per week.  

## 2022-12-27 NOTE — Assessment & Plan Note (Signed)
Chronic, diabetic foot exam was performed. She is now followed by Transplant team at [facility]. I DISCUSSED WITH THE PATIENT AT LENGTH REGARDING THE GOALS OF GLYCEMIC CONTROL AND POSSIBLE LONG-TERM COMPLICATIONS.  I  ALSO STRESSED THE IMPORTANCE OF COMPLIANCE WITH HOME GLUCOSE MONITORING, DIETARY RESTRICTIONS INCLUDING AVOIDANCE OF SUGARY DRINKS/PROCESSED FOODS,  ALONG WITH REGULAR EXERCISE.  I  ALSO STRESSED THE IMPORTANCE OF ANNUAL EYE EXAMS, SELF FOOT CARE AND COMPLIANCE WITH OFFICE VISITS.

## 2022-12-27 NOTE — Assessment & Plan Note (Signed)
Chronic, fair control. Goal BP<120/80.  EKG performed, NSR w/ voltage criteria for LVH and Inferior infarct -probably not recent.  She will continue with nifedipine per transplant team. She is encouraged to follow low sodium diet.

## 2022-12-27 NOTE — Assessment & Plan Note (Signed)

## 2022-12-27 NOTE — Assessment & Plan Note (Signed)
Located underneath bilateral breasts. I will send rx nystatin cream to apply to affected area twice daily as needed. If persistent, she may consider Gold Bond powder or nystatin powder.

## 2022-12-27 NOTE — Assessment & Plan Note (Signed)
Chronic, she is encouraged to stay well hydrated, avoid NSAIDs and keep BP/BS controlled to prevent progression of CKD.

## 2022-12-28 DIAGNOSIS — Z4822 Encounter for aftercare following kidney transplant: Secondary | ICD-10-CM | POA: Diagnosis not present

## 2022-12-28 DIAGNOSIS — N08 Glomerular disorders in diseases classified elsewhere: Secondary | ICD-10-CM | POA: Diagnosis not present

## 2022-12-28 DIAGNOSIS — T8619 Other complication of kidney transplant: Secondary | ICD-10-CM | POA: Diagnosis not present

## 2022-12-28 DIAGNOSIS — Z940 Kidney transplant status: Secondary | ICD-10-CM | POA: Diagnosis not present

## 2022-12-28 DIAGNOSIS — N051 Unspecified nephritic syndrome with focal and segmental glomerular lesions: Secondary | ICD-10-CM | POA: Diagnosis not present

## 2022-12-28 DIAGNOSIS — N261 Atrophy of kidney (terminal): Secondary | ICD-10-CM | POA: Diagnosis not present

## 2022-12-28 DIAGNOSIS — N269 Renal sclerosis, unspecified: Secondary | ICD-10-CM | POA: Diagnosis not present

## 2022-12-28 DIAGNOSIS — T8612 Kidney transplant failure: Secondary | ICD-10-CM | POA: Diagnosis not present

## 2022-12-28 DIAGNOSIS — N178 Other acute kidney failure: Secondary | ICD-10-CM | POA: Diagnosis not present

## 2022-12-28 DIAGNOSIS — I701 Atherosclerosis of renal artery: Secondary | ICD-10-CM | POA: Diagnosis not present

## 2023-01-14 DIAGNOSIS — Z940 Kidney transplant status: Secondary | ICD-10-CM | POA: Diagnosis not present

## 2023-01-14 DIAGNOSIS — E1122 Type 2 diabetes mellitus with diabetic chronic kidney disease: Secondary | ICD-10-CM | POA: Diagnosis not present

## 2023-01-14 DIAGNOSIS — I129 Hypertensive chronic kidney disease with stage 1 through stage 4 chronic kidney disease, or unspecified chronic kidney disease: Secondary | ICD-10-CM | POA: Diagnosis not present

## 2023-01-14 DIAGNOSIS — N2581 Secondary hyperparathyroidism of renal origin: Secondary | ICD-10-CM | POA: Diagnosis not present

## 2023-01-14 DIAGNOSIS — Z79899 Other long term (current) drug therapy: Secondary | ICD-10-CM | POA: Diagnosis not present

## 2023-01-17 LAB — LAB REPORT - SCANNED: EGFR: 19

## 2023-01-20 LAB — HM DIABETES EYE EXAM

## 2023-01-31 DIAGNOSIS — E1165 Type 2 diabetes mellitus with hyperglycemia: Secondary | ICD-10-CM | POA: Diagnosis not present

## 2023-01-31 DIAGNOSIS — Z794 Long term (current) use of insulin: Secondary | ICD-10-CM | POA: Diagnosis not present

## 2023-01-31 DIAGNOSIS — Z7985 Long-term (current) use of injectable non-insulin antidiabetic drugs: Secondary | ICD-10-CM | POA: Diagnosis not present

## 2023-01-31 DIAGNOSIS — Z978 Presence of other specified devices: Secondary | ICD-10-CM | POA: Diagnosis not present

## 2023-02-01 ENCOUNTER — Encounter: Payer: Self-pay | Admitting: Internal Medicine

## 2023-02-10 DIAGNOSIS — E1122 Type 2 diabetes mellitus with diabetic chronic kidney disease: Secondary | ICD-10-CM | POA: Diagnosis not present

## 2023-02-10 DIAGNOSIS — Z940 Kidney transplant status: Secondary | ICD-10-CM | POA: Diagnosis not present

## 2023-02-10 DIAGNOSIS — N2581 Secondary hyperparathyroidism of renal origin: Secondary | ICD-10-CM | POA: Diagnosis not present

## 2023-02-15 DIAGNOSIS — Z01419 Encounter for gynecological examination (general) (routine) without abnormal findings: Secondary | ICD-10-CM | POA: Diagnosis not present

## 2023-02-15 DIAGNOSIS — Z124 Encounter for screening for malignant neoplasm of cervix: Secondary | ICD-10-CM | POA: Diagnosis not present

## 2023-02-15 DIAGNOSIS — R87612 Low grade squamous intraepithelial lesion on cytologic smear of cervix (LGSIL): Secondary | ICD-10-CM | POA: Diagnosis not present

## 2023-02-15 DIAGNOSIS — Z1231 Encounter for screening mammogram for malignant neoplasm of breast: Secondary | ICD-10-CM | POA: Diagnosis not present

## 2023-02-15 LAB — HM MAMMOGRAPHY: HM Mammogram: NORMAL (ref 0–4)

## 2023-02-16 ENCOUNTER — Telehealth: Payer: Medicare HMO | Admitting: Internal Medicine

## 2023-02-16 ENCOUNTER — Encounter: Payer: Self-pay | Admitting: Internal Medicine

## 2023-02-16 DIAGNOSIS — U071 COVID-19: Secondary | ICD-10-CM | POA: Diagnosis not present

## 2023-02-16 DIAGNOSIS — Z940 Kidney transplant status: Secondary | ICD-10-CM

## 2023-02-16 MED ORDER — MOLNUPIRAVIR EUA 200MG CAPSULE: 4.0000 | ORAL_CAPSULE | Freq: Two times a day (BID) | ORAL | 0 refills | Status: AC

## 2023-02-16 MED ORDER — BENZONATATE 100 MG PO CAPS: 100.0000 mg | ORAL_CAPSULE | Freq: Three times a day (TID) | ORAL | 0 refills | Status: DC | PRN

## 2023-02-16 MED ORDER — CETIRIZINE HCL 10 MG PO TABS: 10.0000 mg | ORAL_TABLET | Freq: Every day | ORAL | 2 refills | Status: DC

## 2023-02-16 NOTE — Progress Notes (Signed)
Virtual Visit via Video   This visit type was conducted due to national recommendations for restrictions regarding the COVID-19 Pandemic (e.g. social distancing) in an effort to limit this patient's exposure and mitigate transmission in our community.  Due to her co-morbid illnesses, this patient is at least at moderate risk for complications without adequate follow up.  This format is felt to be most appropriate for this patient at this time.  All issues noted in this document were discussed and addressed.  A limited physical exam was performed with this format.    This visit type was conducted due to national recommendations for restrictions regarding the COVID-19 Pandemic (e.g. social distancing) in an effort to limit this patient's exposure and mitigate transmission in our community.  Patients identity confirmed using two different identifiers.  This format is felt to be most appropriate for this patient at this time.  All issues noted in this document were discussed and addressed.  No physical exam was performed (except for noted visual exam findings with Video Visits).    Date:  02/26/2023   ID:  Samantha [name], DOB 1965/07/11, MRN [id number]  Patient Location:  Home  Provider location:   Office    Chief Complaint:  "I have COVID"  History of Present Illness:    Samantha [name] is a 57 y.o. female who presents via video conferencing for a telehealth visit today.    The patient does have symptoms concerning for COVID-19 infection (fever, chills, cough, or new shortness of breath).   Patient presents today for virtual visit. She presents for COVID treatment. States she tested positive for COVID.  She reports initially experiencing symptoms Monday.  She is now having  coughing, sneezing, runny nose, chills, and fatigue. She did take Tylenol last night. She denies having known ill contact.      Past Medical History:  Diagnosis Date   Anemia in chronic kidney  disease (CKD)    Chronic kidney disease, stage V (HCC)    Hypertension    Kidney transplanted    Uncontrolled diabetes mellitus with microalbuminuria or microproteinuria    Past Surgical History:  Procedure Laterality Date   CARDIAC CATHETERIZATION N/A 03/23/2016   Procedure: Left Heart Cath and Coronary Angiography;  Surgeon: Yates Decamp, MD;  Location: [facility] INVASIVE CV LAB;  Service: Cardiovascular;  Laterality: N/A;   CESAREAN SECTION     DILITATION & CURRETTAGE/HYSTROSCOPY WITH NOVASURE ABLATION N/A 10/24/2014   Procedure: DILATATION & CURETTAGE/HYSTEROSCOPY WITH NOVASURE ABLATION;  Surgeon: Essie Hart, MD;  Location: [facility] ORS;  Service: Gynecology;  Laterality: N/A;   KIDNEY TRANSPLANT  03/28/2020   [facility]   THYROIDECTOMY       Current Meds  Medication Sig   atorvastatin (LIPITOR) 80 MG tablet Take 80 mg by mouth daily.   bacitracin-polymyxin b (POLYSPORIN) ophthalmic ointment Place 1 Application into both eyes every 12 (twelve) hours. apply to eye every 12 hours while awake   benzonatate (TESSALON PERLES) 100 MG capsule Take 1 capsule (100 mg total) by mouth 3 (three) times daily as needed for cough.   cetirizine (ZYRTEC ALLERGY) 10 MG tablet Take 1 tablet (10 mg total) by mouth daily.   cyclobenzaprine (FLEXERIL) 5 MG tablet TAKE 1 TABLET BY MOUTH AT BEDTIME AS NEEDED FOR BACK/HIP PAIN   meclizine (ANTIVERT) 12.5 MG tablet Take 1 tablet (12.5 mg total) by mouth 3 (three) times daily as needed for dizziness.   [EXPIRED] molnupiravir EUA (LAGEVRIO) 200 mg CAPS capsule  Take 4 capsules (800 mg total) by mouth 2 (two) times daily for 5 days.   MOUNJARO 2.5 MG/0.5ML Pen Inject 2.5 mg into the skin once a week.   NIFEdipine (PROCARDIA XL/NIFEDICAL XL) 60 MG 24 hr tablet Take 60 mg by mouth daily.   nystatin cream (MYCOSTATIN) Apply 1 Application topically 2 (two) times daily. prn   predniSONE (DELTASONE) 5 MG tablet Take 10 mg by mouth daily. Patient reports taking 2 daily. One in  the morning & one at night.   sodium bicarbonate 650 MG tablet Take 1,300 mg by mouth 3 (three) times daily.   tacrolimus (PROGRAF) 1 MG capsule Take 3-4 mg by mouth See admin instructions. 3mg  in the morning, 2mg  in the evening     Allergies:   Patient has no known allergies.   Social History   Tobacco Use   Smoking status: Never   Smokeless tobacco: Never  Vaping Use   Vaping status: Never Used  Substance Use Topics   Alcohol use: No   Drug use: No     Family Hx: The patient's family history includes Crohn's disease in her mother; Diabetes in her father and mother; Hypertension in her father and mother; Kidney disease in her father and mother; Ulcers in her father.  ROS:   Please see the history of present illness.    Review of Systems  Constitutional: Negative.   HENT:  Positive for congestion.   Eyes: Negative.   Respiratory:  Positive for cough.   Cardiovascular: Negative.   Neurological: Negative.   Endo/Heme/Allergies: Negative.     All other systems reviewed and are negative.   Labs/Other Tests and Data Reviewed:    Recent Labs: 05/07/2022: ALT 29; BUN 35; Creatinine 2.4; Hemoglobin 12.6; Platelets 205; Potassium 4.2; Sodium 141 12/16/2022: TSH 3.320   Recent Lipid Panel Lab Results  Component Value Date/Time   CHOL 113 12/16/2022 12:20 PM   TRIG 80 12/16/2022 12:20 PM   HDL 47 12/16/2022 12:20 PM   CHOLHDL 2.4 12/16/2022 12:20 PM   LDLCALC 50 12/16/2022 12:20 PM    Wt Readings from Last 3 Encounters:  12/16/22 248 lb 6.4 oz (112.7 kg)  11/12/22 247 lb 6.4 oz (112.2 kg)  05/27/22 233 lb (105.7 kg)     Exam:    Vital Signs:  There were no vitals taken for this visit.    Physical Exam Vitals and nursing note reviewed.  Constitutional:      Appearance: Normal appearance. She is ill-appearing.  HENT:     Head: Normocephalic and atraumatic.  Eyes:     Extraocular Movements: Extraocular movements intact.  Pulmonary:     Effort: Pulmonary effort  is normal.  Musculoskeletal:     Cervical back: Normal range of motion.  Neurological:     Mental Status: She is alert and oriented to person, place, and time.  Psychiatric:        Mood and Affect: Affect normal.     ASSESSMENT & PLAN:    COVID Assessment & Plan:  She would like treatment.  She is a transplant recipient, paxlovid is contraindicated. I did contact her nephrologist, Dr. Marisue Humble to see if Rexene Agent was an option. He stated he was not familiar with this medication. He agreed that Paxlovid was contraindicated. After speaking with a pharmacist, rx molnupiravir was sent to the pharmacy. Possible side effects were discussed with the patient. She is encouraged to take the full course.  I will also refer her for home monitoring/temperature  monitoring program. She is encouraged to email me daily on Mychart to let me know how she is doing. She is encouraged to go to ER should she develop worsening SOB. Pt advised that she will be out of work for five days, although she may return sooner if afebrile greater than 24 hours, she should mask for 10 days. She is also advised to stay well hydrated, move periodically throughout the day and to have a hot beverage daily.  She verbalizes understanding of her treatment plan. All questions were answered to her satisfaction. She understands that she needs to continue to self quarantine.  Orders: -     MyChart Temperature FLOWSHEET; Future  Kidney transplanted  Other orders -     molnupiravir EUA; Take 4 capsules (800 mg total) by mouth 2 (two) times daily for 5 days.  Dispense: 40 capsule; Refill: 0 -     Cetirizine HCl; Take 1 tablet (10 mg total) by mouth daily.  Dispense: 30 tablet; Refill: 2 -     Benzonatate; Take 1 capsule (100 mg total) by mouth 3 (three) times daily as needed for cough.  Dispense: 30 capsule; Refill: 0 -     MYCHART COVID-19 HOME MONITORING PROGRAM; Future     COVID-19 Education: The signs and symptoms of COVID-19 were  discussed with the patient and how to seek care for testing (follow up with PCP or arrange E-visit).  The importance of social distancing was discussed today.  Patient Risk:   After full review of this patients clinical status, I feel that they are at least moderate risk at this time.  Time:   Today, I have spent 19 minutes/ seconds with the patient with telehealth technology discussing above diagnoses.  This time includes conversation with her nephrologist, Dr. Marisue Humble. He agreed to avoid Paxlovid.    Medication Adjustments/Labs and Tests Ordered: Current medicines are reviewed at length with the patient today.  Concerns regarding medicines are outlined above.   Tests Ordered: No orders of the defined types were placed in this encounter.   Medication Changes: Meds ordered this encounter  Medications   molnupiravir EUA (LAGEVRIO) 200 mg CAPS capsule    Sig: Take 4 capsules (800 mg total) by mouth 2 (two) times daily for 5 days.    Dispense:  40 capsule    Refill:  0   cetirizine (ZYRTEC ALLERGY) 10 MG tablet    Sig: Take 1 tablet (10 mg total) by mouth daily.    Dispense:  30 tablet    Refill:  2   benzonatate (TESSALON PERLES) 100 MG capsule    Sig: Take 1 capsule (100 mg total) by mouth 3 (three) times daily as needed for cough.    Dispense:  30 capsule    Refill:  0    Disposition:  Follow up prn  Signed, [name], MD

## 2023-02-17 ENCOUNTER — Encounter: Payer: Self-pay | Admitting: Internal Medicine

## 2023-02-18 ENCOUNTER — Encounter: Payer: Self-pay | Admitting: Internal Medicine

## 2023-02-19 ENCOUNTER — Encounter: Payer: Self-pay | Admitting: Internal Medicine

## 2023-02-21 ENCOUNTER — Telehealth: Payer: Self-pay

## 2023-02-21 NOTE — Telephone Encounter (Signed)
Called patient in regard to COVID questionnaire for worse symptoms of cough. No answer, left VM to call community line with any questions.

## 2023-02-22 DIAGNOSIS — H43822 Vitreomacular adhesion, left eye: Secondary | ICD-10-CM | POA: Diagnosis not present

## 2023-02-22 DIAGNOSIS — H26493 Other secondary cataract, bilateral: Secondary | ICD-10-CM | POA: Diagnosis not present

## 2023-02-22 DIAGNOSIS — H31012 Macula scars of posterior pole (postinflammatory) (post-traumatic), left eye: Secondary | ICD-10-CM | POA: Diagnosis not present

## 2023-02-22 DIAGNOSIS — H3582 Retinal ischemia: Secondary | ICD-10-CM | POA: Diagnosis not present

## 2023-02-22 DIAGNOSIS — H34231 Retinal artery branch occlusion, right eye: Secondary | ICD-10-CM | POA: Diagnosis not present

## 2023-02-22 DIAGNOSIS — E113512 Type 2 diabetes mellitus with proliferative diabetic retinopathy with macular edema, left eye: Secondary | ICD-10-CM | POA: Diagnosis not present

## 2023-02-22 DIAGNOSIS — H4321 Crystalline deposits in vitreous body, right eye: Secondary | ICD-10-CM | POA: Diagnosis not present

## 2023-02-22 DIAGNOSIS — E113591 Type 2 diabetes mellitus with proliferative diabetic retinopathy without macular edema, right eye: Secondary | ICD-10-CM | POA: Diagnosis not present

## 2023-02-23 ENCOUNTER — Other Ambulatory Visit: Payer: Self-pay | Admitting: Internal Medicine

## 2023-02-23 ENCOUNTER — Encounter (INDEPENDENT_AMBULATORY_CARE_PROVIDER_SITE_OTHER): Payer: Self-pay

## 2023-02-25 ENCOUNTER — Encounter: Payer: Self-pay | Admitting: Internal Medicine

## 2023-02-25 ENCOUNTER — Encounter (INDEPENDENT_AMBULATORY_CARE_PROVIDER_SITE_OTHER): Payer: Self-pay

## 2023-02-26 DIAGNOSIS — U071 COVID-19: Secondary | ICD-10-CM | POA: Insufficient documentation

## 2023-02-26 NOTE — Assessment & Plan Note (Signed)
She would like treatment.  She is a transplant recipient, paxlovid is contraindicated. I did contact her nephrologist, Dr. Marisue Humble to see if Rexene Agent was an option. He stated he was not familiar with this medication. He agreed that Paxlovid was contraindicated. After speaking with a pharmacist, rx molnupiravir was sent to the pharmacy. Possible side effects were discussed with the patient. She is encouraged to take the full course.  I will also refer her for home monitoring/temperature monitoring program. She is encouraged to email me daily on Mychart to let me know how she is doing. She is encouraged to go to ER should she develop worsening SOB. Pt advised that she will be out of work for five days, although she may return sooner if afebrile greater than 24 hours, she should mask for 10 days. She is also advised to stay well hydrated, move periodically throughout the day and to have a hot beverage daily.  She verbalizes understanding of her treatment plan. All questions were answered to her satisfaction. She understands that she needs to continue to self quarantine.

## 2023-03-06 NOTE — Addendum Note (Signed)
Addended by: [name] on: 03/06/2023 11:38 AM   Modules accepted: Level of Service

## 2023-03-09 ENCOUNTER — Encounter: Payer: Self-pay | Admitting: Internal Medicine

## 2023-03-10 DIAGNOSIS — E1122 Type 2 diabetes mellitus with diabetic chronic kidney disease: Secondary | ICD-10-CM | POA: Diagnosis not present

## 2023-03-15 ENCOUNTER — Encounter (INDEPENDENT_AMBULATORY_CARE_PROVIDER_SITE_OTHER): Payer: Medicare HMO | Admitting: Adult Health

## 2023-03-21 DIAGNOSIS — E11319 Type 2 diabetes mellitus with unspecified diabetic retinopathy without macular edema: Secondary | ICD-10-CM | POA: Diagnosis not present

## 2023-03-21 DIAGNOSIS — E119 Type 2 diabetes mellitus without complications: Secondary | ICD-10-CM | POA: Diagnosis not present

## 2023-03-21 DIAGNOSIS — H26491 Other secondary cataract, right eye: Secondary | ICD-10-CM | POA: Diagnosis not present

## 2023-03-25 DIAGNOSIS — R87612 Low grade squamous intraepithelial lesion on cytologic smear of cervix (LGSIL): Secondary | ICD-10-CM | POA: Diagnosis not present

## 2023-03-25 DIAGNOSIS — R87810 Cervical high risk human papillomavirus (HPV) DNA test positive: Secondary | ICD-10-CM | POA: Diagnosis not present

## 2023-04-06 ENCOUNTER — Ambulatory Visit: Payer: Medicare HMO

## 2023-04-06 ENCOUNTER — Encounter: Payer: Self-pay | Admitting: Internal Medicine

## 2023-04-06 ENCOUNTER — Ambulatory Visit (INDEPENDENT_AMBULATORY_CARE_PROVIDER_SITE_OTHER): Payer: Medicare HMO | Admitting: Internal Medicine

## 2023-04-06 VITALS — BP 134/70 | HR 78 | Temp 98.1°F | Ht 68.0 in | Wt 243.4 lb

## 2023-04-06 DIAGNOSIS — I129 Hypertensive chronic kidney disease with stage 1 through stage 4 chronic kidney disease, or unspecified chronic kidney disease: Secondary | ICD-10-CM | POA: Diagnosis not present

## 2023-04-06 DIAGNOSIS — E1122 Type 2 diabetes mellitus with diabetic chronic kidney disease: Secondary | ICD-10-CM | POA: Diagnosis not present

## 2023-04-06 DIAGNOSIS — Z794 Long term (current) use of insulin: Secondary | ICD-10-CM

## 2023-04-06 DIAGNOSIS — Z23 Encounter for immunization: Secondary | ICD-10-CM | POA: Diagnosis not present

## 2023-04-06 DIAGNOSIS — Z940 Kidney transplant status: Secondary | ICD-10-CM

## 2023-04-06 DIAGNOSIS — N184 Chronic kidney disease, stage 4 (severe): Secondary | ICD-10-CM

## 2023-04-06 NOTE — Patient Instructions (Signed)

## 2023-04-06 NOTE — Progress Notes (Signed)
I,[name], CMA,acting as a Neurosurgeon for [name], MD.,have documented all relevant documentation on the behalf of [name], MD,as directed by  [name], MD while in the presence of [name], MD.  Subjective:  Patient ID: Samantha [name] , female    DOB: 05-Nov-1965 , 57 y.o.   MRN: [id number]  Chief Complaint  Patient presents with   Diabetes   Hypertension    HPI  Patient is here for a hypertension/DM follow up. She reports compliance with meds. She denies headaches, chest pain and shortness of breath. She is also followed by [facility] teams, along with [facility].     30-day average is 153    Diabetes She presents for her follow-up diabetic visit. She has type 2 diabetes mellitus. Her disease course has been stable. Pertinent negatives for diabetes include no blurred vision, no polydipsia, no polyphagia and no polyuria. There are no hypoglycemic complications. Symptoms are improving. Diabetic complications include nephropathy. Risk factors for coronary artery disease include diabetes mellitus, dyslipidemia, hypertension, obesity and sedentary lifestyle. She is following a generally healthy diet. She participates in exercise intermittently. Her breakfast blood glucose range is generally 130-140 mg/dl. (120 blood sugar is the highest. Lunch 150-160 Dinner about 200 ) An ACE inhibitor/angiotensin II receptor blocker is contraindicated.  Hypertension This is a chronic problem. The current episode started more than 1 year ago. The problem has been gradually improving since onset. The problem is controlled. Pertinent negatives include no blurred vision. Past treatments include ACE inhibitors and diuretics. The current treatment provides moderate improvement. Compliance problems include exercise.  Hypertensive end-organ damage includes kidney disease.     Past Medical History:  Diagnosis Date   Anemia in chronic kidney disease  (CKD)    Chronic kidney disease, stage V (HCC)    Hypertension    Kidney transplanted    Uncontrolled diabetes mellitus with microalbuminuria or microproteinuria      Family History  Problem Relation Age of Onset   Hypertension Mother    Diabetes Mother    Kidney disease Mother    Crohn's disease Mother    Diabetes Father    Hypertension Father    Ulcers Father    Kidney disease Father      Current Outpatient Medications:    atorvastatin (LIPITOR) 80 MG tablet, Take 80 mg by mouth daily., Disp: , Rfl:    bacitracin-polymyxin b (POLYSPORIN) ophthalmic ointment, Place 1 Application into both eyes every 12 (twelve) hours. apply to eye every 12 hours while awake, Disp: 3.5 g, Rfl: 0   benzonatate (TESSALON PERLES) 100 MG capsule, Take 1 capsule (100 mg total) by mouth 3 (three) times daily as needed for cough., Disp: 30 capsule, Rfl: 0   cetirizine (ZYRTEC ALLERGY) 10 MG tablet, Take 1 tablet (10 mg total) by mouth daily., Disp: 30 tablet, Rfl: 2   cyclobenzaprine (FLEXERIL) 5 MG tablet, TAKE 1 TABLET BY MOUTH AT BEDTIME AS NEEDED FOR BACK/HIP PAIN, Disp: 30 tablet, Rfl: 0   meclizine (ANTIVERT) 12.5 MG tablet, Take 1 tablet (12.5 mg total) by mouth 3 (three) times daily as needed for dizziness., Disp: 30 tablet, Rfl: 0   MOUNJARO 2.5 MG/0.5ML Pen, Inject 2.5 mg into the skin once a week., Disp: , Rfl:    NIFEdipine (PROCARDIA XL/NIFEDICAL XL) 60 MG 24 hr tablet, Take 60 mg by mouth daily., Disp: , Rfl:    nystatin cream (MYCOSTATIN), Apply 1 Application topically 2 (  two) times daily. prn, Disp: 30 g, Rfl: 1   predniSONE (DELTASONE) 5 MG tablet, Take 10 mg by mouth daily. Patient reports taking 2 daily. One in the morning & one at night., Disp: , Rfl:    sodium bicarbonate 650 MG tablet, Take 1,300 mg by mouth 3 (three) times daily., Disp: , Rfl:    tacrolimus (PROGRAF) 1 MG capsule, Take 3-4 mg by mouth See admin instructions. 3mg  in the morning, 2mg  in the evening, Disp: , Rfl:     azelastine (OPTIVAR) 0.05 % ophthalmic solution, Place 1 drop into both eyes 2 (two) times daily. (Patient not taking: Reported on 12/16/2022), Disp: 6 mL, Rfl: 1   insulin glargine (LANTUS SOLOSTAR) 100 UNIT/ML Solostar Pen, Inject 20 Units into the skin daily. (Patient not taking: Reported on 02/16/2023), Disp: 45 mL, Rfl: 3   insulin lispro (HUMALOG KWIKPEN) 100 UNIT/ML KwikPen, Per sliding scale through [facility] transplant team (Patient not taking: Reported on 02/16/2023), Disp: 15 mL, Rfl: 11   No Known Allergies   Review of Systems  Constitutional: Negative.   Eyes:  Negative for blurred vision.  Respiratory: Negative.    Cardiovascular: Negative.   Gastrointestinal: Negative.   Endocrine: Negative for polydipsia, polyphagia and polyuria.  Neurological: Negative.   Psychiatric/Behavioral: Negative.       Today's Vitals   04/06/23 1150  BP: 134/70  Pulse: 78  Temp: 98.1 F (36.7 C)  SpO2: 98%  Weight: 243 lb 6.4 oz (110.4 kg)  Height: 5\' 8"  (1.727 m)   Body mass index is 37.01 kg/m.  Wt Readings from Last 3 Encounters:  04/06/23 243 lb 6.4 oz (110.4 kg)  12/16/22 248 lb 6.4 oz (112.7 kg)  11/12/22 247 lb 6.4 oz (112.2 kg)     Objective:  Physical Exam Vitals and nursing note reviewed.  Constitutional:      Appearance: Normal appearance.  HENT:     Head: Normocephalic and atraumatic.  Eyes:     Extraocular Movements: Extraocular movements intact.  Cardiovascular:     Rate and Rhythm: Normal rate and regular rhythm.     Heart sounds: Normal heart sounds.  Pulmonary:     Effort: Pulmonary effort is normal.     Breath sounds: Normal breath sounds.  Musculoskeletal:     Cervical back: Normal range of motion.  Skin:    General: Skin is warm.  Neurological:     General: No focal deficit present.     Mental Status: She is alert.  Psychiatric:        Mood and Affect: Mood normal.        Behavior: Behavior normal.         Assessment And Plan:  Type 2  diabetes mellitus with diabetic nephropathy, with long-term current use of insulin [facility]) Assessment & Plan: Chronic,she is currently followed by Transplant team at [facility]. Her last A1c was 6.8 in Sept 2024, I will have this value abstracted into her chart. She is encouraged to gradually increase her daily activity.   Hypertensive nephropathy Assessment & Plan: Chronic, fair control. Goal BP<120/80.  She will continue with nifedipine per transplant team. She is encouraged to follow low sodium diet.    Chronic kidney disease, stage IV (severe) (HCC) Assessment & Plan: Chronic, she is s/p transplant.  She is encouraged to stay well hydrated, avoid NSAIDs and keep BP/BS controlled to prevent progression of CKD.     Kidney transplanted  Immunization due -  Flu vaccine trivalent PF, 6mos and older(Flulaval,Afluria,Fluarix,Fluzone)     Return MOVE FEB TO APRIL 2024 - BP CHECK.  Patient was given opportunity to ask questions. Patient verbalized understanding of the plan and was able to repeat key elements of the plan. All questions were answered to their satisfaction.    I, [name], MD, have reviewed all documentation for this visit. The documentation on 04/06/23 for the exam, diagnosis, procedures, and orders are all accurate and complete.   IF YOU HAVE BEEN REFERRED TO A SPECIALIST, IT MAY TAKE 1-2 WEEKS TO SCHEDULE/PROCESS THE REFERRAL. IF YOU HAVE NOT HEARD FROM US/SPECIALIST IN TWO WEEKS, PLEASE GIVE [place] A CALL AT [phone].   THE PATIENT IS ENCOURAGED TO PRACTICE SOCIAL DISTANCING DUE TO THE COVID-19 PANDEMIC.

## 2023-04-08 ENCOUNTER — Other Ambulatory Visit: Payer: Self-pay | Admitting: Family Medicine

## 2023-04-08 DIAGNOSIS — H1011 Acute atopic conjunctivitis, right eye: Secondary | ICD-10-CM

## 2023-04-10 NOTE — Assessment & Plan Note (Signed)
Chronic, fair control. Goal BP<120/80.  She will continue with nifedipine per transplant team. She is encouraged to follow low sodium diet.

## 2023-04-10 NOTE — Assessment & Plan Note (Signed)
Chronic, she is s/p transplant.  She is encouraged to stay well hydrated, avoid NSAIDs and keep BP/BS controlled to prevent progression of CKD.

## 2023-04-10 NOTE — Assessment & Plan Note (Signed)
Chronic,she is currently followed by Transplant team at [facility]. Her last A1c was 6.8 in Sept 2024, I will have this value abstracted into her chart. She is encouraged to gradually increase her daily activity.

## 2023-04-14 ENCOUNTER — Ambulatory Visit: Payer: Medicare HMO

## 2023-04-14 DIAGNOSIS — N871 Moderate cervical dysplasia: Secondary | ICD-10-CM | POA: Diagnosis not present

## 2023-04-14 DIAGNOSIS — N2581 Secondary hyperparathyroidism of renal origin: Secondary | ICD-10-CM | POA: Diagnosis not present

## 2023-04-14 DIAGNOSIS — Z0000 Encounter for general adult medical examination without abnormal findings: Secondary | ICD-10-CM

## 2023-04-14 DIAGNOSIS — Z940 Kidney transplant status: Secondary | ICD-10-CM | POA: Diagnosis not present

## 2023-04-14 NOTE — Patient Instructions (Signed)
Ms. Samantha [name] , Thank you for taking time to come for your Medicare Wellness Visit. I appreciate your ongoing commitment to your health goals. Please review the following plan we discussed and let me know if I can assist you in the future.   Referrals/Orders/Follow-Ups/Clinician Recommendations: none  This is a list of the screening recommended for you and due dates:  Health Maintenance  Topic Date Due   COVID-19 Vaccine (5 - 2023-24 season) 04/22/2023*   Hemoglobin A1C  06/18/2023   Yearly kidney health urinalysis for diabetes  12/16/2023   Complete foot exam   12/16/2023   Mammogram  01/02/2024   Yearly kidney function blood test for diabetes  01/17/2024   Eye exam for diabetics  01/20/2024   Medicare Annual Wellness Visit  04/13/2024   DTaP/Tdap/Td vaccine (2 - Td or Tdap) 07/19/2024   Pap with HPV screening  01/14/2027   Colon Cancer Screening  01/23/2032   Flu Shot  Completed   Hepatitis C Screening  Completed   HIV Screening  Completed   Zoster (Shingles) Vaccine  Completed   HPV Vaccine  Aged Out  *Topic was postponed. The date shown is not the original due date.    Advanced directives: (ACP Link)Information on Advanced Care Planning can be found at [facility] Advance Health Care Directives Advance Health Care Directives ([email or web address]   Next Medicare Annual Wellness Visit scheduled for next year: No, office will schedule  insert Preventive Care Attachment Reference

## 2023-04-14 NOTE — Progress Notes (Signed)
Subjective:   Samantha [name] is a 57 y.o. female who presents for Medicare Annual (Subsequent) preventive examination.  Visit Complete: Virtual I connected with  Samantha [name] on 04/14/23 by a audio enabled telemedicine application and verified that I am speaking with the correct person using two identifiers.  Patient Location: Home  Provider Location: Office/Clinic  I discussed the limitations of evaluation and management by telemedicine. The patient expressed understanding and agreed to proceed.  Vital Signs: Because this visit was a virtual/telehealth visit, some criteria may be missing or patient reported. Any vitals not documented were not able to be obtained and vitals that have been documented are patient reported.    Cardiac Risk Factors include: diabetes mellitus;dyslipidemia;hypertension     Objective:    Today's Vitals   There is no height or weight on file to calculate BMI.     04/14/2023    4:33 PM 03/11/2022   10:48 AM 06/19/2021    9:02 PM 03/23/2016    8:46 AM 10/15/2014   11:05 AM  Advanced Directives  Does Patient Have a Medical Advance Directive? No No No No No  Would patient like information on creating a medical advance directive?   No - Patient declined No - patient declined information No - patient declined information    Current Medications (verified) Outpatient Encounter Medications as of 04/14/2023  Medication Sig   atorvastatin (LIPITOR) 80 MG tablet Take 80 mg by mouth daily.   cyclobenzaprine (FLEXERIL) 5 MG tablet TAKE 1 TABLET BY MOUTH AT BEDTIME AS NEEDED FOR BACK/HIP PAIN   insulin glargine (LANTUS SOLOSTAR) 100 UNIT/ML Solostar Pen Inject 20 Units into the skin daily.   insulin lispro (HUMALOG KWIKPEN) 100 UNIT/ML KwikPen Per sliding scale through [facility] transplant team   MOUNJARO 2.5 MG/0.5ML Pen Inject 2.5 mg into the skin once a week.   NIFEdipine (PROCARDIA XL/NIFEDICAL XL) 60 MG 24 hr tablet Take 60 mg by mouth  daily.   nystatin cream (MYCOSTATIN) Apply 1 Application topically 2 (two) times daily. prn   predniSONE (DELTASONE) 5 MG tablet Take 10 mg by mouth daily. Patient reports taking 2 daily. One in the morning & one at night.   sodium bicarbonate 650 MG tablet Take 1,300 mg by mouth 3 (three) times daily.   tacrolimus (PROGRAF) 1 MG capsule Take 3-4 mg by mouth See admin instructions. 3mg  in the morning, 2mg  in the evening   azelastine (OPTIVAR) 0.05 % ophthalmic solution Place 1 drop into both eyes 2 (two) times daily. (Patient not taking: Reported on 12/16/2022)   bacitracin-polymyxin b (POLYSPORIN) ophthalmic ointment Place 1 Application into both eyes every 12 (twelve) hours. apply to eye every 12 hours while awake (Patient not taking: Reported on 04/14/2023)   benzonatate (TESSALON PERLES) 100 MG capsule Take 1 capsule (100 mg total) by mouth 3 (three) times daily as needed for cough. (Patient not taking: Reported on 04/14/2023)   cetirizine (ZYRTEC ALLERGY) 10 MG tablet Take 1 tablet (10 mg total) by mouth daily. (Patient not taking: Reported on 04/14/2023)   meclizine (ANTIVERT) 12.5 MG tablet Take 1 tablet (12.5 mg total) by mouth 3 (three) times daily as needed for dizziness. (Patient not taking: Reported on 04/14/2023)   No facility-administered encounter medications on file as of 04/14/2023.    Allergies (verified) Patient has no known allergies.   History: Past Medical History:  Diagnosis Date   Anemia in chronic kidney disease (CKD)    Chronic kidney disease, stage  V (HCC)    Hypertension    Kidney transplanted    Uncontrolled diabetes mellitus with microalbuminuria or microproteinuria    Past Surgical History:  Procedure Laterality Date   CARDIAC CATHETERIZATION N/A 03/23/2016   Procedure: Left Heart Cath and Coronary Angiography;  Surgeon: Yates Decamp, MD;  Location: [facility] INVASIVE CV LAB;  Service: Cardiovascular;  Laterality: N/A;   CESAREAN SECTION     DILITATION &  CURRETTAGE/HYSTROSCOPY WITH NOVASURE ABLATION N/A 10/24/2014   Procedure: DILATATION & CURETTAGE/HYSTEROSCOPY WITH NOVASURE ABLATION;  Surgeon: Essie Hart, MD;  Location: [facility] ORS;  Service: Gynecology;  Laterality: N/A;   KIDNEY TRANSPLANT  03/28/2020   [facility]   THYROIDECTOMY     Family History  Problem Relation Age of Onset   Hypertension Mother    Diabetes Mother    Kidney disease Mother    Crohn's disease Mother    Diabetes Father    Hypertension Father    Ulcers Father    Kidney disease Father    Social History   Socioeconomic History   Marital status: Divorced    Spouse name: Not on file   Number of children: Not on file   Years of education: Not on file   Highest education level: Not on file  Occupational History   Not on file  Tobacco Use   Smoking status: Never   Smokeless tobacco: Never  Vaping Use   Vaping status: Never Used  Substance and Sexual Activity   Alcohol use: No   Drug use: No   Sexual activity: Not on file  Other Topics Concern   Not on file  Social History Narrative   Not on file   Social Determinants of Health   Financial Resource Strain: Low Risk  (04/14/2023)   Overall Financial Resource Strain (CARDIA)    Difficulty of Paying Living Expenses: Not hard at all  Food Insecurity: No Food Insecurity (04/14/2023)   Hunger Vital Sign    Worried About Running Out of Food in the Last Year: Never true    Ran Out of Food in the Last Year: Never true  Transportation Needs: No Transportation Needs (04/14/2023)   PRAPARE - Administrator, Civil Service (Medical): No    Lack of Transportation (Non-Medical): No  Physical Activity: Inactive (04/14/2023)   Exercise Vital Sign    Days of Exercise per Week: 0 days    Minutes of Exercise per Session: 0 min  Stress: No Stress Concern Present (04/14/2023)   [employer] of Occupational Health - Occupational Stress Questionnaire    Feeling of Stress : Not at all  Social Connections:  Moderately Isolated (04/14/2023)   Social Connection and Isolation Panel [NHANES]    Frequency of Communication with Friends and Family: More than three times a week    Frequency of Social Gatherings with Friends and Family: Once a week    Attends Religious Services: Never    Database administrator or Organizations: Yes    Attends Engineer, structural: 1 to 4 times per year    Marital Status: Divorced    Tobacco Counseling Counseling given: Not Answered   Clinical Intake:  Pre-visit preparation completed: Yes  Pain : No/denies pain     Nutritional Risks: None Diabetes: Yes CBG done?: No Did pt. bring in CBG monitor from home?: No  How often do you need to have someone help you when you read instructions, pamphlets, or other written materials from your doctor or pharmacy?: 1 -  Never  Interpreter Needed?: No  Information entered by :: NAllen LPN   Activities of Daily Living    04/14/2023    4:26 PM  In your present state of health, do you have any difficulty performing the following activities:  Hearing? 0  Vision? 0  Difficulty concentrating or making decisions? 0  Walking or climbing stairs? 0  Dressing or bathing? 0  Doing errands, shopping? 0  Preparing Food and eating ? N  Using the Toilet? N  In the past six months, have you accidently leaked urine? N  Do you have problems with loss of bowel control? N  Managing your Medications? N  Managing your Finances? N  Housekeeping or managing your Housekeeping? N    Patient Care Team: Dorothyann Peng, MD as PCP - General (Internal Medicine)  Indicate any recent Medical Services you may have received from other than [facility] providers in the past year (date may be approximate).     Assessment:   This is a routine wellness examination for Samantha [name].  Hearing/Vision screen Hearing Screening - Comments:: Denies hearing issues Vision Screening - Comments:: Regular eye exams, [facility]   Goals Addressed              This Visit's Progress    Patient Stated       04/14/2023, start exercising again       Depression Screen    04/14/2023    4:34 PM 11/12/2022   11:16 AM 05/27/2022    9:56 AM 03/11/2022   10:49 AM 11/24/2021   11:26 AM 11/18/2020   11:52 AM 10/15/2019   11:13 AM  PHQ 2/9 Scores  PHQ - 2 Score 0 0 0 0 0 0 0  PHQ- 9 Score 0 0         Fall Risk    04/14/2023    4:34 PM 11/12/2022   11:16 AM 05/27/2022    9:56 AM 03/11/2022   10:49 AM 12/28/2018   11:35 AM  Fall Risk   Falls in the past year? 0 0 0 0 0  Number falls in past yr: 0 0 0 0   Injury with Fall? 0 0 0 0   Risk for fall due to : Medication side effect No Fall Risks No Fall Risks Medication side effect   Follow up Falls prevention discussed;Falls evaluation completed Falls evaluation completed;Education provided Falls evaluation completed Falls prevention discussed;Falls evaluation completed;Education provided     MEDICARE RISK AT HOME: Medicare Risk at Home Any stairs in or around the home?: No If so, are there any without handrails?: No Home free of loose throw rugs in walkways, pet beds, electrical cords, etc?: Yes Adequate lighting in your home to reduce risk of falls?: Yes Life alert?: No Use of a cane, walker or w/c?: No Grab bars in the bathroom?: No Shower chair or bench in shower?: No Elevated toilet seat or a handicapped toilet?: No  TIMED UP AND GO:  Was the test performed?  No    Cognitive Function:        04/14/2023    4:35 PM 03/11/2022   10:50 AM  6CIT Screen  What Year? 0 points 4 points  What month? 0 points 0 points  What time? 0 points 0 points  Count back from 20 0 points 0 points  Months in reverse 0 points 0 points  Repeat phrase 0 points 0 points  Total Score 0 points 4 points    Immunizations Immunization History  Administered  Date(s) Administered   Hepb-cpg 01/29/2020   Influenza, Seasonal, Injecte, Preservative Fre 04/06/2023   Influenza,inj,Quad PF,6+ Mos  03/01/2013, 02/27/2020, 01/26/2021, 05/19/2022   Moderna Covid-19 Fall Seasonal Vaccine 48yrs & older 05/19/2022   PFIZER(Purple Top)SARS-COV-2 Vaccination 11/25/2019, 12/29/2019   Pfizer Covid-19 Vaccine Bivalent Booster 88yrs & up 03/24/2021   Pneumococcal Conjugate-13 12/12/2019   Pneumococcal Polysaccharide-23 05/21/2021   Tdap 07/20/2014   Zoster Recombinant(Shingrix) 04/21/2021, 11/18/2021    TDAP status: Up to date  Flu Vaccine status: Up to date  Pneumococcal vaccine status: Up to date  Covid-19 vaccine status: Information provided on how to obtain vaccines.   Qualifies for Shingles Vaccine? Yes   Zostavax completed Yes   Shingrix Completed?: Yes  Screening Tests Health Maintenance  Topic Date Due   COVID-19 Vaccine (5 - 2023-24 season) 04/22/2023 (Originally 01/09/2023)   HEMOGLOBIN A1C  06/18/2023   Diabetic kidney evaluation - Urine ACR  12/16/2023   FOOT EXAM  12/16/2023   MAMMOGRAM  01/02/2024   Diabetic kidney evaluation - eGFR measurement  01/17/2024   OPHTHALMOLOGY EXAM  01/20/2024   Medicare Annual Wellness (AWV)  04/13/2024   DTaP/Tdap/Td (2 - Td or Tdap) 07/19/2024   Cervical Cancer Screening (HPV/Pap Cotest)  01/14/2027   Colonoscopy  01/23/2032   INFLUENZA VACCINE  Completed   Hepatitis C Screening  Completed   HIV Screening  Completed   Zoster Vaccines- Shingrix  Completed   HPV VACCINES  Aged Out    Health Maintenance  There are no preventive care reminders to display for this patient.   Colorectal cancer screening: Type of screening: Colonoscopy. Completed 01/22/2022. Repeat every 10 years  Mammogram status: Completed 2024. Repeat every year  Bone Density status: n/a  Lung Cancer Screening: (Low Dose CT Chest recommended if Age 44-80 years, 20 pack-year currently smoking OR have quit w/in 15years.) does not qualify.   Lung Cancer Screening Referral: no  Additional Screening:  Hepatitis C Screening: does qualify; Completed  03/28/2020  Vision Screening: Recommended annual ophthalmology exams for early detection of glaucoma and other disorders of the eye. Is the patient up to date with their annual eye exam?  Yes  Who is the provider or what is the name of the office in which the patient attends annual eye exams? [facility] If pt is not established with a provider, would they like to be referred to a provider to establish care? No .   Dental Screening: Recommended annual dental exams for proper oral hygiene  Diabetic Foot Exam: Diabetic Foot Exam: Completed 12/16/2022  Community Resource Referral / Chronic Care Management: CRR required this visit?  No   CCM required this visit?  No     Plan:     I have personally reviewed and noted the following in the patient's chart:   Medical and social history Use of alcohol, tobacco or illicit drugs  Current medications and supplements including opioid prescriptions. Patient is not currently taking opioid prescriptions. Functional ability and status Nutritional status Physical activity Advanced directives List of other physicians Hospitalizations, surgeries, and ER visits in previous 12 months Vitals Screenings to include cognitive, depression, and falls Referrals and appointments  In addition, I have reviewed and discussed with patient certain preventive protocols, quality metrics, and best practice recommendations. A written personalized care plan for preventive services as well as general preventive health recommendations were provided to patient.     Barb Merino, LPN   44/0/1027   After Visit Summary: (MyChart) Due to this being  a telephonic visit, the after visit summary with patients personalized plan was offered to patient via MyChart   Nurse Notes: none

## 2023-04-15 LAB — RESULTS CONSOLE HPV: CHL HPV: POSITIVE

## 2023-04-15 LAB — HM PAP SMEAR

## 2023-05-13 DIAGNOSIS — Z79899 Other long term (current) drug therapy: Secondary | ICD-10-CM | POA: Diagnosis not present

## 2023-05-13 DIAGNOSIS — I129 Hypertensive chronic kidney disease with stage 1 through stage 4 chronic kidney disease, or unspecified chronic kidney disease: Secondary | ICD-10-CM | POA: Diagnosis not present

## 2023-05-13 DIAGNOSIS — N2581 Secondary hyperparathyroidism of renal origin: Secondary | ICD-10-CM | POA: Diagnosis not present

## 2023-05-13 DIAGNOSIS — Z940 Kidney transplant status: Secondary | ICD-10-CM | POA: Diagnosis not present

## 2023-05-13 DIAGNOSIS — E213 Hyperparathyroidism, unspecified: Secondary | ICD-10-CM | POA: Diagnosis not present

## 2023-05-13 DIAGNOSIS — E8770 Fluid overload, unspecified: Secondary | ICD-10-CM | POA: Diagnosis not present

## 2023-05-13 DIAGNOSIS — E1122 Type 2 diabetes mellitus with diabetic chronic kidney disease: Secondary | ICD-10-CM | POA: Diagnosis not present

## 2023-05-17 NOTE — Progress Notes (Signed)
 Subjective:    Patient ID:  Samantha [name] is a 58 y.o.  August 16, 1965   female  who presents for employment physical exam. He/she denies any current medical problems or concerns. Questionnaire and forms reviewed with patient and responses verified. Immunizations are up to date per patient.  Immunization records have not been visualized by myself.  Past Medical History, Past surgery History, Allergies, Social history, and Family History were reviewed and updated.    Objective:   BP 150/63 (BP Location: Right arm, Patient Position: Sitting)   Pulse 65   Temp 97 F (36.1 C) (Tympanic)   Resp 18   Ht 1.727 m (5' 8)   Wt 104 kg (230 lb)   SpO2 100%   BMI 34.97 kg/m    General Appearance:    Alert, cooperative, no distress, appears stated age  Head:    Normocephalic, without obvious abnormality, atraumatic  Eyes:    PERRL, conjunctiva/corneas clear, EOM's intact, Bilaterally      Ears:    Normal TM's and external ear canals, Bilaterally  Throat:   OP pink moist   Back:     SROM normal,   Chest wall:    No tenderness or deformity  Heart:    RRR, murmur present   Abdomen:     Soft, NT, ND, + BS, -HSM   Extremities:   Extremities normal, atraumatic, no cyanosis or edema  Pulses:   2+ and symmetric all extremities  Skin:   Skin color, texture, turgor normal, no rashes or lesions  Lymph nodes:   Cervical, supraclavicular normal  Neurologic:   CNII-XII grossly intact. Normal strength, sensation and reflexes  throughout     Assessment:    Satisfactory Employment physical exam.    Plan:    1. Completed, signed and returned forms. 2. Follow up will be as needed.

## 2023-05-20 ENCOUNTER — Encounter: Payer: Self-pay | Admitting: Pharmacist

## 2023-05-25 DIAGNOSIS — E113591 Type 2 diabetes mellitus with proliferative diabetic retinopathy without macular edema, right eye: Secondary | ICD-10-CM | POA: Diagnosis not present

## 2023-05-25 DIAGNOSIS — H31012 Macula scars of posterior pole (postinflammatory) (post-traumatic), left eye: Secondary | ICD-10-CM | POA: Diagnosis not present

## 2023-05-25 DIAGNOSIS — H34231 Retinal artery branch occlusion, right eye: Secondary | ICD-10-CM | POA: Diagnosis not present

## 2023-05-25 DIAGNOSIS — H4321 Crystalline deposits in vitreous body, right eye: Secondary | ICD-10-CM | POA: Diagnosis not present

## 2023-05-25 DIAGNOSIS — H43822 Vitreomacular adhesion, left eye: Secondary | ICD-10-CM | POA: Diagnosis not present

## 2023-05-25 DIAGNOSIS — E113512 Type 2 diabetes mellitus with proliferative diabetic retinopathy with macular edema, left eye: Secondary | ICD-10-CM | POA: Diagnosis not present

## 2023-05-25 DIAGNOSIS — H3582 Retinal ischemia: Secondary | ICD-10-CM | POA: Diagnosis not present

## 2023-06-21 ENCOUNTER — Other Ambulatory Visit: Payer: Self-pay | Admitting: Internal Medicine

## 2023-06-29 ENCOUNTER — Ambulatory Visit: Payer: Medicare HMO | Admitting: Internal Medicine

## 2023-07-05 ENCOUNTER — Other Ambulatory Visit: Payer: Self-pay | Admitting: Family Medicine

## 2023-07-05 DIAGNOSIS — H1011 Acute atopic conjunctivitis, right eye: Secondary | ICD-10-CM

## 2023-07-13 DIAGNOSIS — E1122 Type 2 diabetes mellitus with diabetic chronic kidney disease: Secondary | ICD-10-CM | POA: Diagnosis not present

## 2023-07-23 IMAGING — CT CT RENAL STONE PROTOCOL
2 of 4 series · 14 of 46 positions shown, 16 images · non-contrast
Comparison: CTs abdomen and pelvis without contrast 08/27/2019,
05/22/2018. more recently a chest CT without contrast was performed
10/13/2020

CLINICAL DATA: Flank pain.  Kidney stone suspected.



[Series 2: axial st · axial · 0.98mm/px · z∈[-506,-81]mm · 11 of 97 slices shown, 13 images]
[im 6/97  soft-tissue]
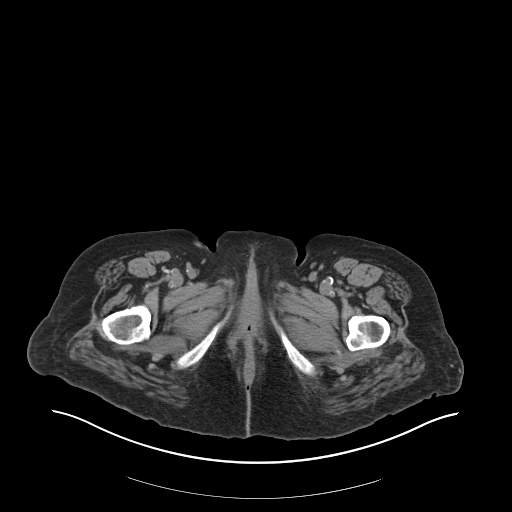
[im 6/97  bone]
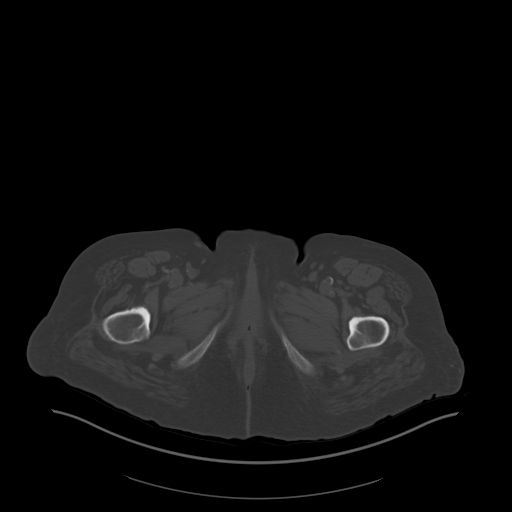
[im 17/97  soft-tissue]
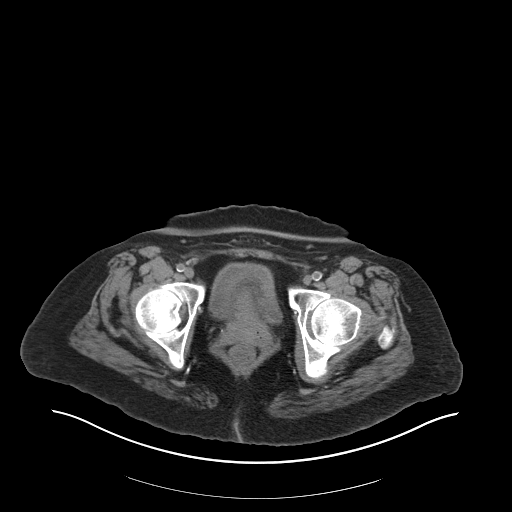
[im 22/97  soft-tissue]
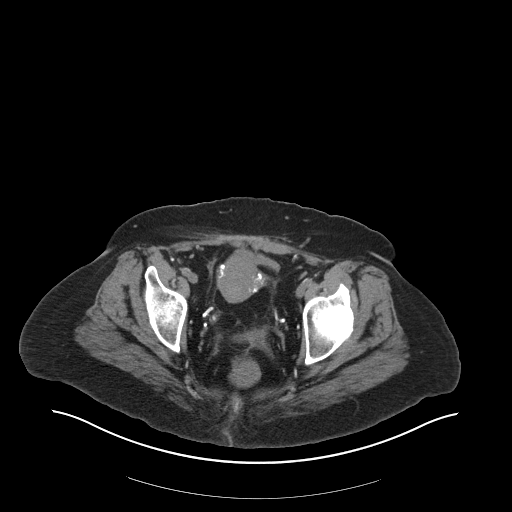
[im 33/97  soft-tissue]
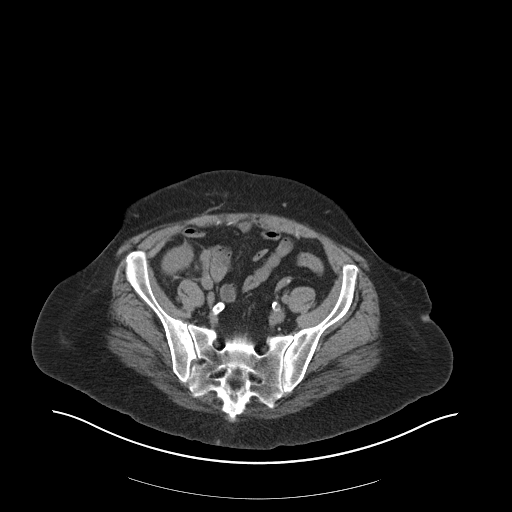
[im 38/97  soft-tissue]
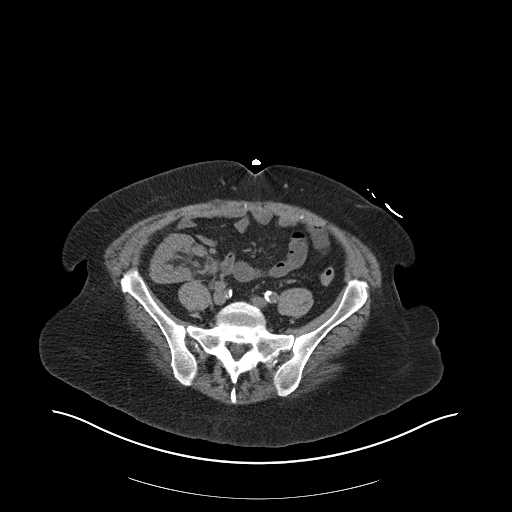
[im 49/97  soft-tissue]
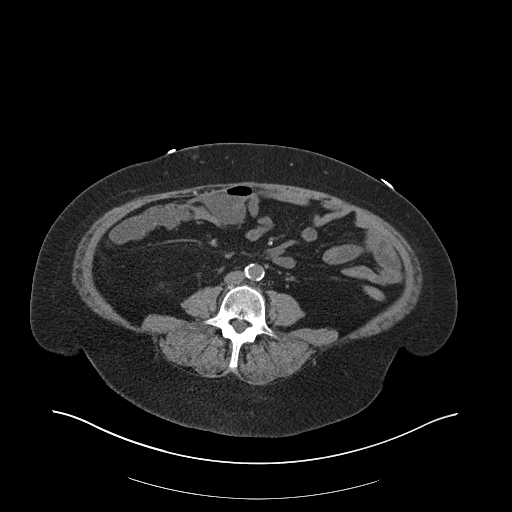
[im 59/97  soft-tissue]
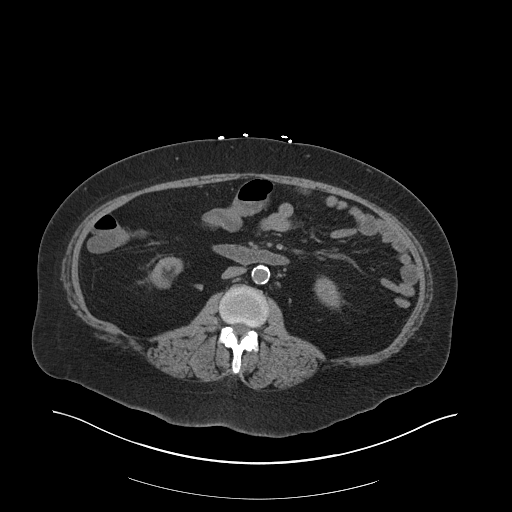
[im 65/97  soft-tissue]
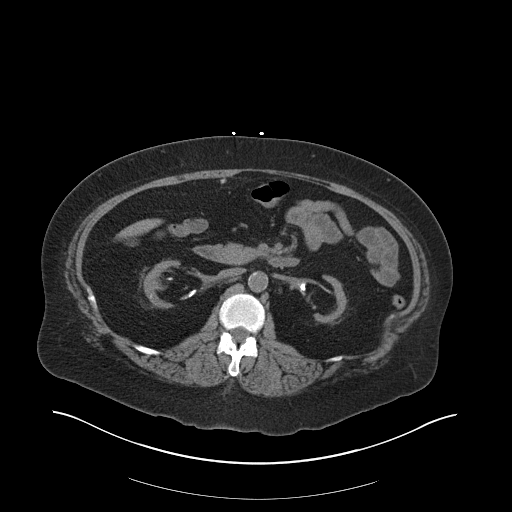
[im 75/97  soft-tissue]
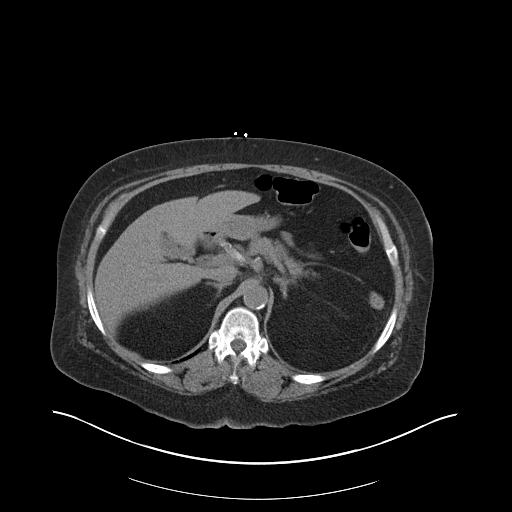
[im 75/97  bone]
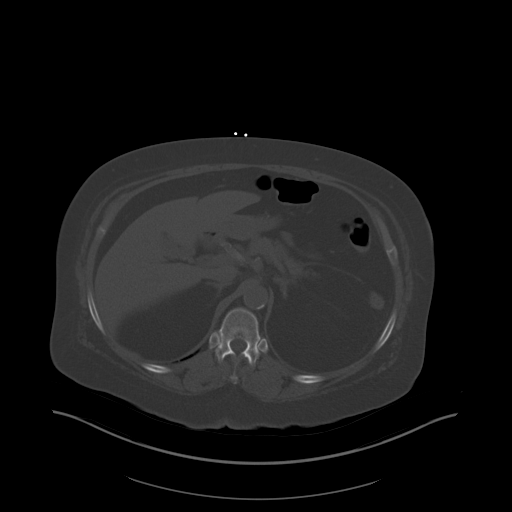
[im 81/97  soft-tissue]
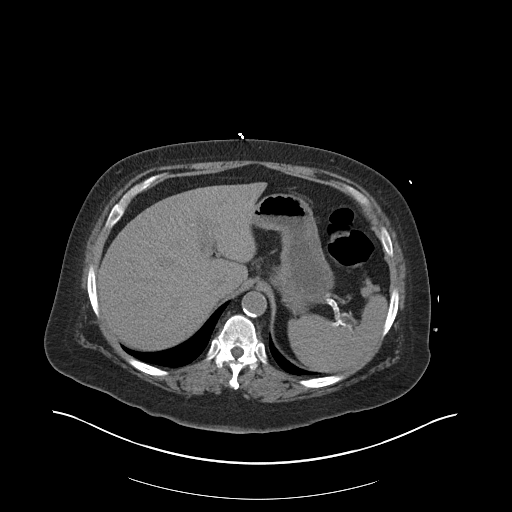
[im 91/97  soft-tissue]
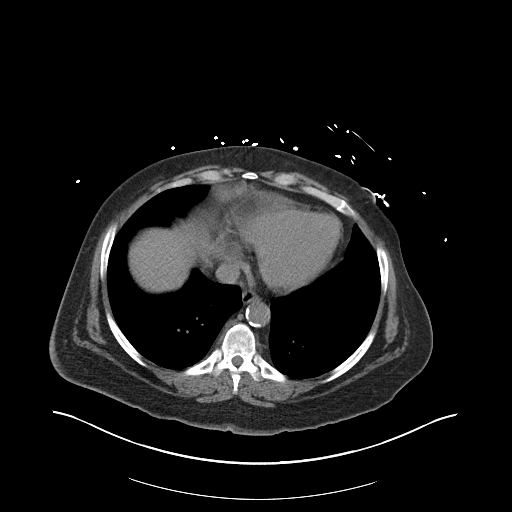

[Series 4: coronal · coronal · 0.87mm/px · 3 of 156 slices shown]
[im 52/156  soft-tissue]
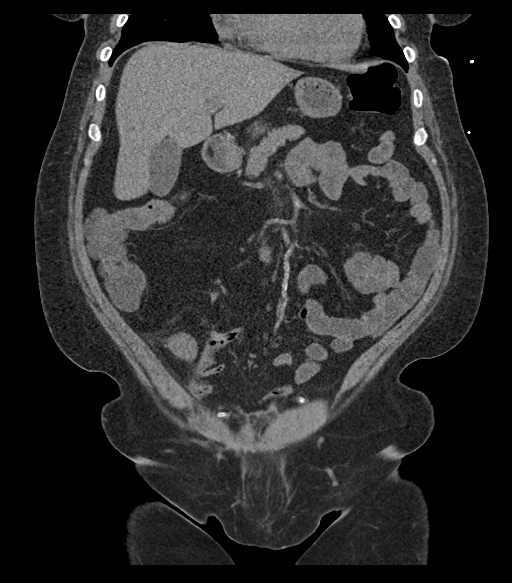
[im 69/156  soft-tissue]
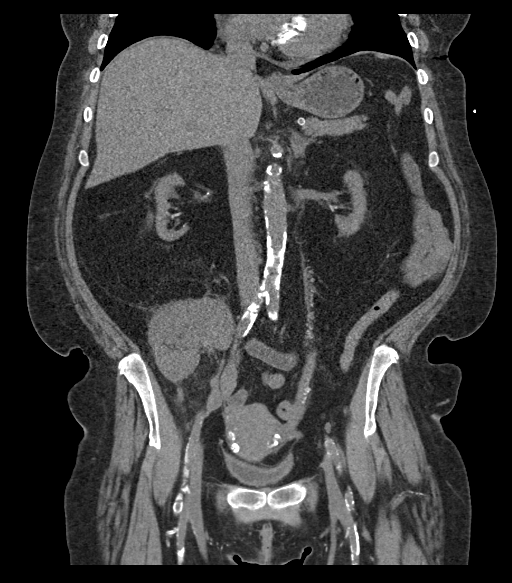
[im 87/156  soft-tissue]
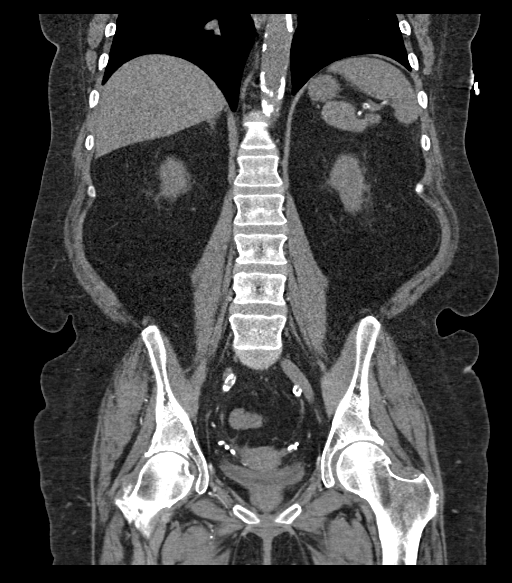

[14 of 46 positions shown; findings below may reference images not displayed]

FINDINGS: Lower chest: Lung bases are clear. The cardiac size is normal. There
is a small anterior pericardial effusion not seen previously.

Calcification in the mitral and aortic valve planes is again noted,
and three-vessel coronary artery calcific disease. Small hiatal
hernia.

Hepatobiliary: 15 cm length slightly steatotic liver. No focal
abnormality is seen without contrast. There are scattered
calcifications of the hepatic arterial main and branch arteries at
the liver hilum. No calcified gallstones, gallbladder thickening or
biliary dilatation are observed.

Pancreas: Unremarkable without contrast.

Spleen: Unremarkable without contrast , normal in size.

Adrenals/Urinary Tract: There is no adrenal mass. Both kidneys have
undergone moderate cortical thinning and volume loss over the prior
studies. Both kidneys measure 7.8 cm in length with renovascular
calcifications.

There is a small cyst in the posterosuperior right kidney with no
other focal cortical abnormality visible without contrast. There are
no collecting system stones or hydroureteronephrosis.

There is mild thickening of the bladder versus nondistention,
equivocal perivesical haziness which may suggest cystitis.

There is transplant kidney in the right iliac fossa new from prior
studies, which shows no stones or hydronephrosis but does show
evidence of periureteral stranding , nonspecific and could be
related to scarring with the renal transplant or inflammatory
process. The transplant renal cortex is normal in thickness.

Stomach/Bowel: Small hiatal hernia. No wall thickening or dilatation
there is seen including the appendix. There is fluid in the
ascending and transverse colon without wall thickening. Scattered
sigmoid diverticula without diverticulitis.

Vascular/Lymphatic: There is extensive aortic, iliac and branch
vessel atherosclerosis without AAA. No adenopathy is seen.

Reproductive: The uterus is intact. There are heavy vascular
calcifications in its outer wall, extending to lower pelvic
sidewalls the uterus and ovaries are not enlarged.

Other: In the right upper abdomen anterior to the left lobe of liver
there is a partially fatty mass not seen on prior studies of the
abdomen and pelvis but was seen on last year's chest CT, with a few
punctate calcifications inferiorly, irregular shape and measuring
3.1 x 0.9 by 2.4 cm.

Smaller similar lesion is seen just inferior to this. These are most
likely omental infarcts or other benign fat necrosis. Partially
calcified lymph nodes or possible as well.

There is no free air, hemorrhage or fluid.

Musculoskeletal: No worrisome regional skeletal lesions. Mild facet
hypertrophy lumbar spine. Small umbilical fat hernia.
IMPRESSION: 1. No evidence of urinary stones or obstruction including of the
transplanted kidney in the right iliac fossa, new from prior abdomen
pelvis CTs.
2. The bladder is contracted but it is possible the patient could
have cystitis, with slight perivesical stranding noted. Correlation
with urinalysis is recommended.
3. There is stranding around the transplant ureter, which could be
postsurgical scarring or inflammatory process. There is no
hydronephrosis.
4. Extensive aortoiliac and branch vessel atherosclerosis.
Calcification in the mitral and aortic valves.
5. Significant interval renal atrophy.
6. Small hiatal hernia. Small umbilical fat hernia. No bowel
obstruction, incarceration or inflammation.
7. Fluid in the colon.
8. Small pericardial effusion.
9. Foci of likely fat necrosis in the right upper quadrant. Probable
old omental infarcts new since prior abdomen pelvis CTs.

## 2023-07-23 IMAGING — CR DG CHEST 2V
2 series · 2 of 2 positions shown · non-contrast
Comparison: CT chest 10/13/2020, 03/29/2020

CLINICAL DATA: Sepsis

EXAM:
CHEST - 2 VIEW

[w chest lat]
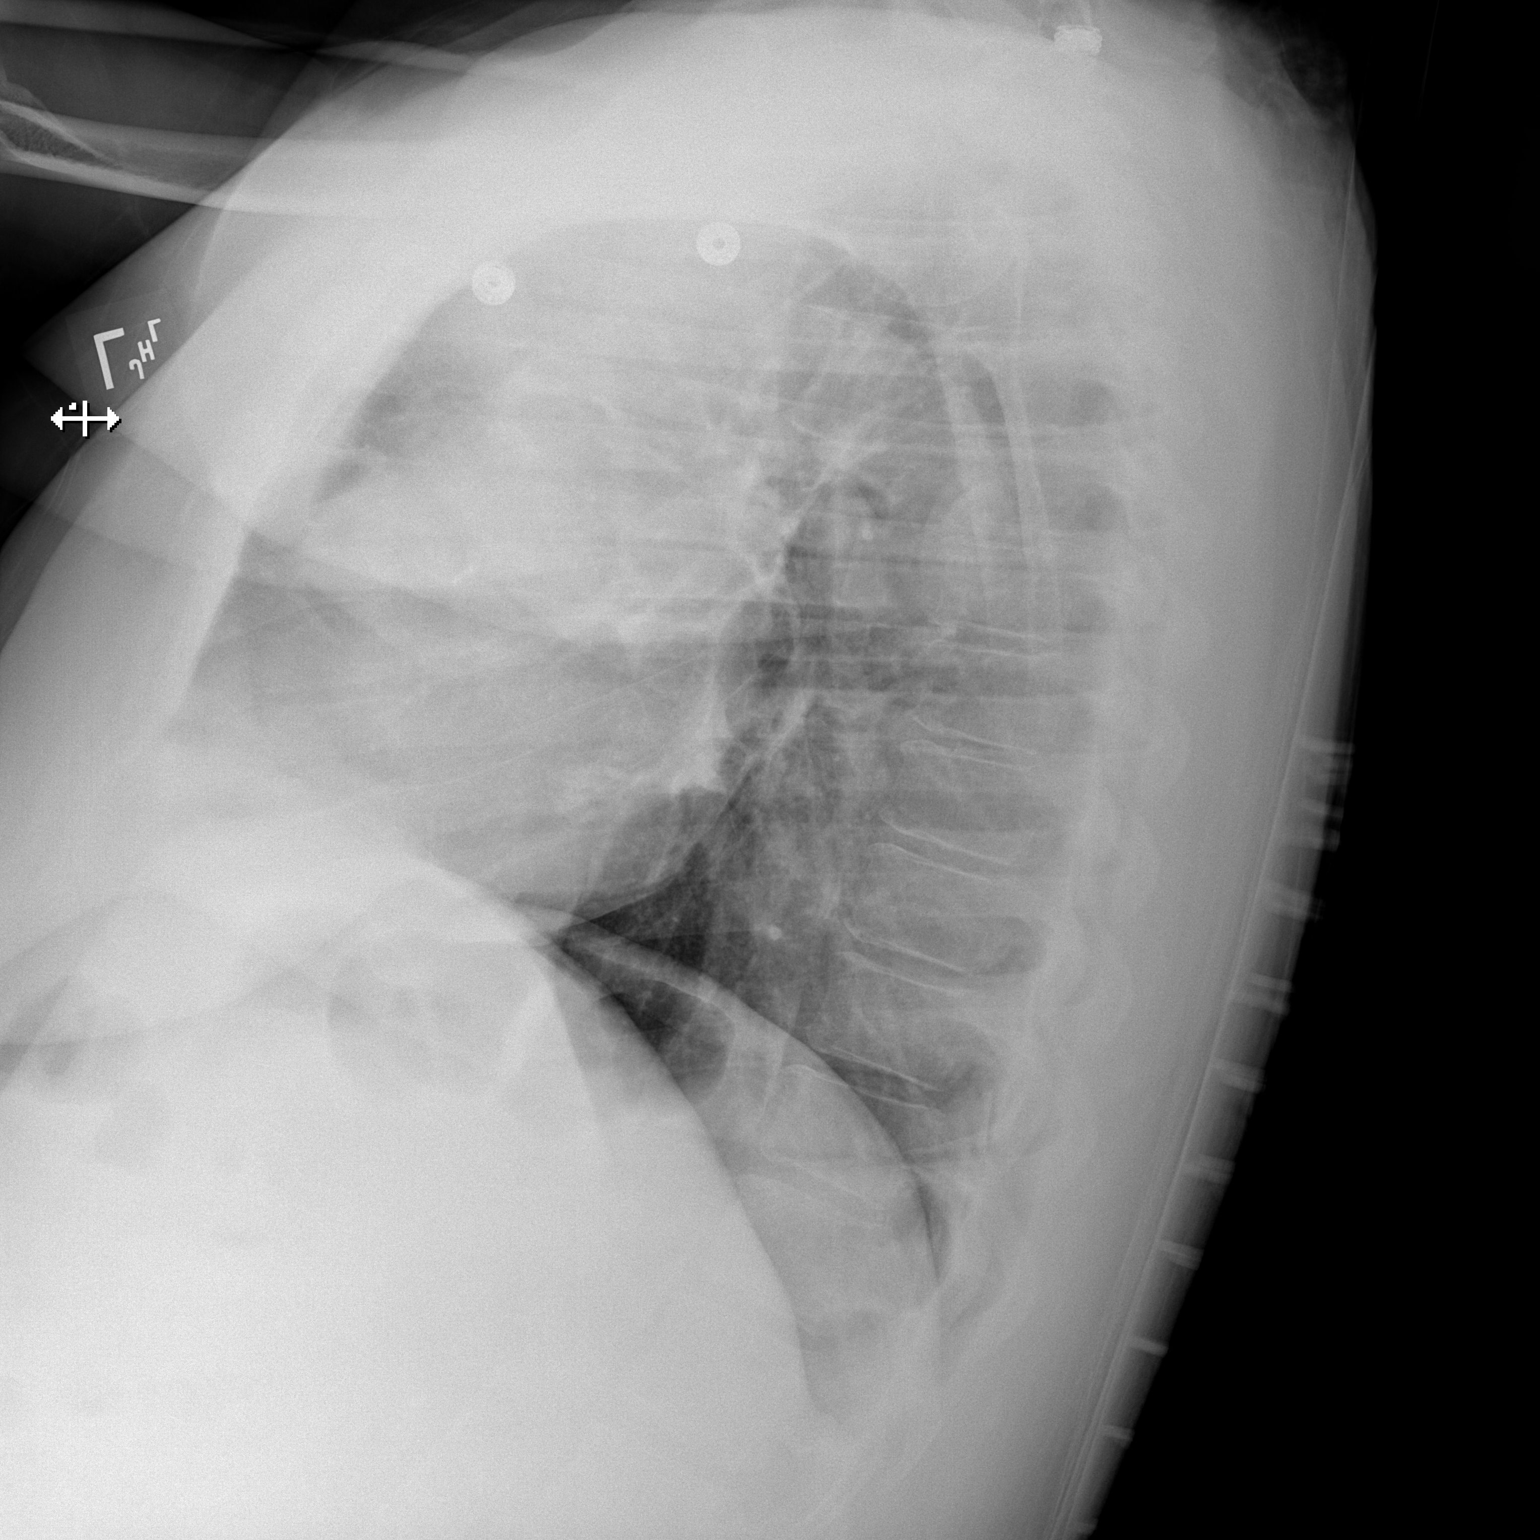

[x chest ap]
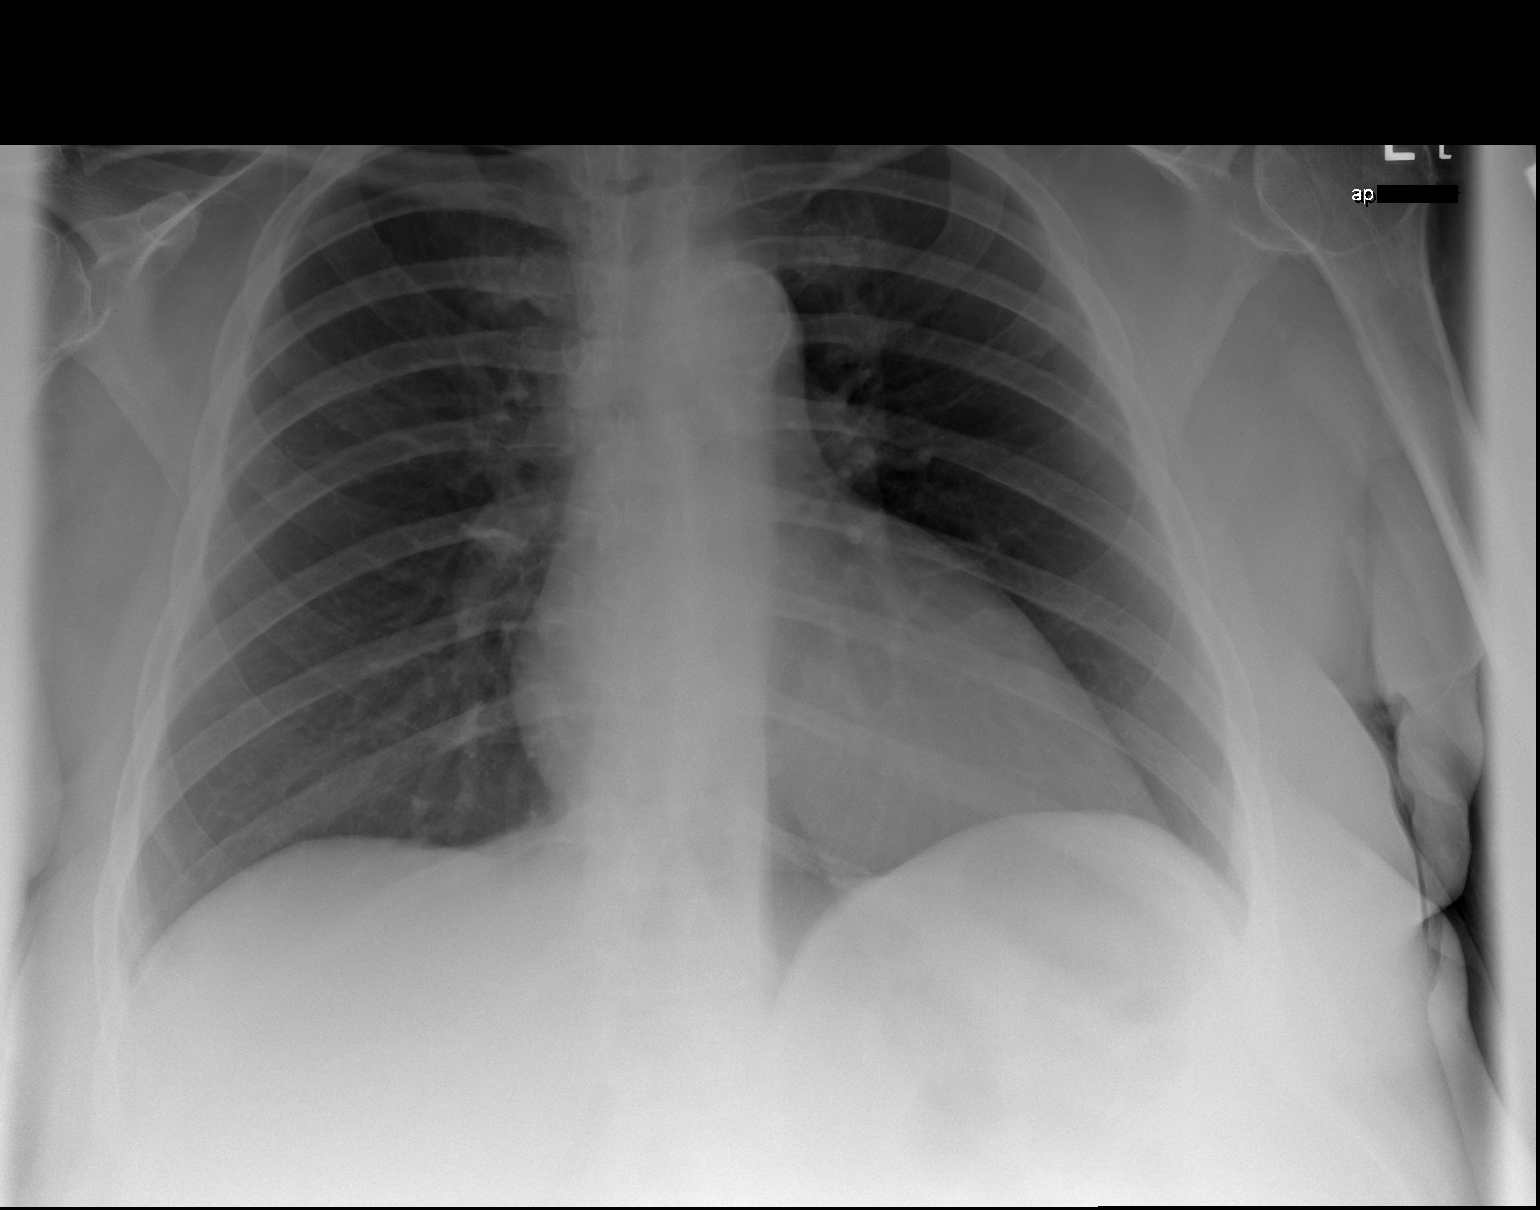

[2 of 2 positions shown; findings below may reference images not displayed]

FINDINGS: The heart size and mediastinal contours are within normal limits.
Aortic atherosclerosis. Both lungs are clear. The visualized
skeletal structures are unremarkable.
IMPRESSION: No active cardiopulmonary disease.

## 2023-07-27 ENCOUNTER — Other Ambulatory Visit: Payer: Self-pay | Admitting: Family Medicine

## 2023-07-27 DIAGNOSIS — H1011 Acute atopic conjunctivitis, right eye: Secondary | ICD-10-CM

## 2023-08-01 DIAGNOSIS — E119 Type 2 diabetes mellitus without complications: Secondary | ICD-10-CM | POA: Diagnosis not present

## 2023-08-01 DIAGNOSIS — Z978 Presence of other specified devices: Secondary | ICD-10-CM | POA: Diagnosis not present

## 2023-08-01 DIAGNOSIS — Z794 Long term (current) use of insulin: Secondary | ICD-10-CM | POA: Diagnosis not present

## 2023-08-01 LAB — HEMOGLOBIN A1C: Hemoglobin A1C: 7.5

## 2023-08-22 ENCOUNTER — Ambulatory Visit: Payer: Medicare HMO | Admitting: Internal Medicine

## 2023-08-23 ENCOUNTER — Encounter (HOSPITAL_COMMUNITY): Payer: Self-pay

## 2023-08-23 ENCOUNTER — Encounter (HOSPITAL_COMMUNITY): Payer: Self-pay | Admitting: Obstetrics and Gynecology

## 2023-08-23 NOTE — Progress Notes (Signed)
 Spoke w/ via phone for pre-op interview--- Samantha [name] needs dos---- BMP, CBG per anesthesia.        [name] results------ A1C 7.5 dated 08/01/23 in Epic. Current EKG in Epic dated 12/16/22. COVID test -----patient states asymptomatic no test needed Arrive at -------0530 NPO after MN NO Solid Food.  Pre-Surgery Ensure or G2:  Med rec completed Medications to take morning of surgery ----- Nifedipine, Prograf, prednisone and Optivar eye drops. Diabetic medication ----- 1/2 lantus insulin dose night before surgery, no DM meds AM of surgery.  GLP1 agonist last dose: Mounjaro 08/22/23 GLP1 instructions: Hold any further doses of mounjaro until after procedure, patient verbalized understanding.  Patient instructed no nail polish to be worn day of surgery Patient instructed to bring photo id and insurance card day of surgery Patient aware to have Driver (ride ) / caregiver    for 24 hours after surgery - Daughter Samantha [name] Patient Special Instructions ----- Shower with antibacterial soap. Pre-Op special Instructions -----  Patient verbalized understanding of instructions that were given at this phone interview. Patient denies chest pain, sob, fever, cough at the interview.

## 2023-08-29 ENCOUNTER — Ambulatory Visit (INDEPENDENT_AMBULATORY_CARE_PROVIDER_SITE_OTHER): Payer: Medicare HMO | Admitting: Internal Medicine

## 2023-08-29 ENCOUNTER — Encounter: Payer: Self-pay | Admitting: Internal Medicine

## 2023-08-29 VITALS — BP 130/70 | HR 78 | Temp 98.7°F | Ht 68.0 in | Wt 238.4 lb

## 2023-08-29 DIAGNOSIS — N184 Chronic kidney disease, stage 4 (severe): Secondary | ICD-10-CM | POA: Diagnosis not present

## 2023-08-29 DIAGNOSIS — Z794 Long term (current) use of insulin: Secondary | ICD-10-CM | POA: Diagnosis not present

## 2023-08-29 DIAGNOSIS — E1121 Type 2 diabetes mellitus with diabetic nephropathy: Secondary | ICD-10-CM | POA: Diagnosis not present

## 2023-08-29 DIAGNOSIS — Z6836 Body mass index (BMI) 36.0-36.9, adult: Secondary | ICD-10-CM | POA: Diagnosis not present

## 2023-08-29 DIAGNOSIS — Z940 Kidney transplant status: Secondary | ICD-10-CM

## 2023-08-29 DIAGNOSIS — I129 Hypertensive chronic kidney disease with stage 1 through stage 4 chronic kidney disease, or unspecified chronic kidney disease: Secondary | ICD-10-CM

## 2023-08-29 DIAGNOSIS — E1122 Type 2 diabetes mellitus with diabetic chronic kidney disease: Secondary | ICD-10-CM | POA: Diagnosis not present

## 2023-08-29 DIAGNOSIS — E66812 Obesity, class 2: Secondary | ICD-10-CM

## 2023-08-29 DIAGNOSIS — E11649 Type 2 diabetes mellitus with hypoglycemia without coma: Secondary | ICD-10-CM

## 2023-08-29 MED ORDER — LANTUS SOLOSTAR 100 UNIT/ML ~~LOC~~ SOPN
PEN_INJECTOR | SUBCUTANEOUS | Status: AC
Start: 2023-08-29 — End: ?

## 2023-08-29 MED ORDER — MOUNJARO 7.5 MG/0.5ML ~~LOC~~ SOAJ: 7.5000 mg | SUBCUTANEOUS | Status: DC

## 2023-08-29 NOTE — Patient Instructions (Signed)

## 2023-08-29 NOTE — Progress Notes (Signed)
 I,Victoria T Basil Lim, CMA,acting as a Neurosurgeon for Smiley Dung, MD.,have documented all relevant documentation on the behalf of Smiley Dung, MD,as directed by  Smiley Dung, MD while in the presence of Smiley Dung, MD.  Subjective:  Patient ID: Samantha [name] , female    DOB: 10-10-1965 , 58 y.o.   MRN: [id number]  Chief Complaint  Patient presents with   Diabetes    Patient presents today for bp & diabetes follow up. She reports compliance with medications. Denies, chest pain & sob.    Hypertension    HPI Discussed the use of AI scribe software for clinical note transcription with the patient, who gave verbal consent to proceed.  History of Present Illness Savi Voice Myleen Finfrock is a 58 year old female with diabetes who presents for a diabetes check.  She is also followed by Endo at [facility].  Her last A1c was 7.5% in March, and her 30-day average blood glucose is 167 mg/dL, corresponding to an J4N of 7.3%. She uses a Dexcom for continuous glucose monitoring. She has experienced episodes of hypoglycemia, particularly at night, with the lowest recorded blood sugar in the low sixties, which occurred when she was on a higher dose of Lantus . She has since reduced her Lantus  dose to 10-15 units in the morning and evening, which has helped prevent further hypoglycemic episodes. She is also on Mounjaro 7.5 mg, which has been increased from a previous dose of  5mg , and she rarely uses Humalog  unless her blood sugar is high due to dietary choices.  She is currently taking atorvastatin  80 mg daily, azelastine  eye drops as needed, and Allegra for allergies. She has not needed to use Zyrtec  or Tessalon  Perles recently. Her prednisone  dose is 5 mg daily, and she takes nifedipine  60 mg daily. She also takes Prograf , 2 mg twice daily, following a kidney transplant three years ago. Her nephrologist appointments have been irregular due to scheduling changes, and she is due to see her  nephrologist at the end of May. She has not had any postoperative complications from her transplant.  She is scheduled for a procedure similar to an endometrial ablation this coming Friday due to an abnormal cervical cell finding. This was identified months ago, and she describes the procedure as using a tool similar to a 'melon ball scooper' to remove the cells.  In terms of physical activity, she works at a school and averages over 2,000 steps a day. She acknowledges the need to increase her exercise to 30 minutes daily. She has lost five pounds since her last visit.      Diabetes She presents for her follow-up diabetic visit. She has type 2 diabetes mellitus. Her disease course has been stable. Pertinent negatives for diabetes include no blurred vision, no polydipsia, no polyphagia and no polyuria. There are no hypoglycemic complications. Symptoms are improving. Diabetic complications include nephropathy. Risk factors for coronary artery disease include diabetes mellitus, dyslipidemia, hypertension, obesity and sedentary lifestyle. She is following a generally healthy diet. She participates in exercise intermittently. Her breakfast blood glucose range is generally 130-140 mg/dl. (120 blood sugar is the highest. Lunch 150-160 Dinner about 200 ) An ACE inhibitor/angiotensin II receptor blocker is contraindicated.  Hypertension This is a chronic problem. The current episode started more than 1 year ago. The problem has been gradually improving since onset. The problem is controlled. Pertinent negatives include no blurred vision. Past treatments include ACE inhibitors and diuretics. The current  treatment provides moderate improvement. Compliance problems include exercise.  Hypertensive end-organ damage includes kidney disease.     Past Medical History:  Diagnosis Date   Anemia in chronic kidney disease (CKD)    Chronic kidney disease, stage V (HCC)    Hypertension    Kidney transplanted     Uncontrolled diabetes mellitus with microalbuminuria or microproteinuria      Family History  Problem Relation Age of Onset   Hypertension Mother    Diabetes Mother    Kidney disease Mother    Crohn's disease Mother    Diabetes Father    Hypertension Father    Ulcers Father    Kidney disease Father      Current Outpatient Medications:    atorvastatin  (LIPITOR) 80 MG tablet, Take 80 mg by mouth daily., Disp: , Rfl:    azelastine  (OPTIVAR ) 0.05 % ophthalmic solution, INSTILL 1 DROP INTO BOTH EYES TWICE A DAY, Disp: 18 mL, Rfl: 1   cyclobenzaprine  (FLEXERIL ) 5 MG tablet, TAKE 1 TABLET BY MOUTH AT BEDTIME AS NEEDED FOR BACK/HIP PAIN, Disp: 30 tablet, Rfl: 0   insulin  lispro (HUMALOG  KWIKPEN) 100 UNIT/ML KwikPen, Per sliding scale through [facility] transplant team, Disp: 15 mL, Rfl: 11   NIFEdipine  (PROCARDIA  XL/NIFEDICAL XL) 60 MG 24 hr tablet, Take 60 mg by mouth daily., Disp: , Rfl:    nystatin  cream (MYCOSTATIN ), Apply 1 Application topically 2 (two) times daily. prn, Disp: 30 g, Rfl: 1   predniSONE  (DELTASONE ) 5 MG tablet, Take 10 mg by mouth daily. Patient reports taking one tab daily, Disp: , Rfl:    sodium bicarbonate  650 MG tablet, Take 1,300 mg by mouth 3 (three) times daily., Disp: , Rfl:    tacrolimus  (PROGRAF ) 1 MG capsule, Take 3-4 mg by mouth See admin instructions. 2mg  in the morning, 2mg  in the evening, Disp: , Rfl:    tirzepatide (MOUNJARO) 7.5 MG/0.5ML Pen, Inject 7.5 mg into the skin once a week., Disp: , Rfl:    furosemide  (LASIX ) 20 MG tablet, Take 20 mg by mouth daily., Disp: , Rfl:    insulin  glargine (LANTUS  SOLOSTAR) 100 UNIT/ML Solostar Pen, Inject 15 units sq nightly, Disp: , Rfl:    meclizine  (ANTIVERT ) 12.5 MG tablet, Take 1 tablet (12.5 mg total) by mouth 3 (three) times daily as needed for dizziness. (Patient not taking: Reported on 08/29/2023), Disp: 30 tablet, Rfl: 0   No Known Allergies   Review of Systems  Constitutional: Negative.   Eyes:  Negative  for blurred vision.  Respiratory: Negative.    Cardiovascular: Negative.   Gastrointestinal: Negative.   Endocrine: Negative for polydipsia, polyphagia and polyuria.  Neurological: Negative.   Psychiatric/Behavioral: Negative.       Today's Vitals   08/29/23 1552  BP: 130/70  Pulse: 78  Temp: 98.7 F (37.1 C)  SpO2: 98%  Weight: 238 lb 6.4 oz (108.1 kg)  Height: 5\' 8"  (1.727 m)   Body mass index is 36.25 kg/m.  Wt Readings from Last 3 Encounters:  09/02/23 238 lb (108 kg)  08/29/23 238 lb 6.4 oz (108.1 kg)  04/06/23 243 lb 6.4 oz (110.4 kg)     Objective:  Physical Exam Vitals and nursing note reviewed.  Constitutional:      Appearance: Normal appearance. She is obese.  HENT:     Head: Normocephalic and atraumatic.  Eyes:     Extraocular Movements: Extraocular movements intact.  Cardiovascular:     Rate and Rhythm: Normal rate and regular rhythm.  Heart sounds: Normal heart sounds.  Pulmonary:     Effort: Pulmonary effort is normal.     Breath sounds: Normal breath sounds.  Musculoskeletal:     Cervical back: Normal range of motion.  Skin:    General: Skin is warm.  Neurological:     General: No focal deficit present.     Mental Status: She is alert.  Psychiatric:        Mood and Affect: Mood normal.        Behavior: Behavior normal.         Assessment And Plan:  Type 2 diabetes mellitus with diabetic nephropathy, with long-term current use of insulin  [facility]) Assessment & Plan: Chronic,she is currently followed by Transplant team at [facility]. Her last A1c was 7.5 in March 2025.  I will have this abstracted into her chart. She will c/w Lantus  and  Mounjaro 7.5mg  weekly. Reminded to stop eating when full.  She is encouraged to gradually increase her daily activity.  Orders: -     CMP14+EGFR  Type 2 diabetes mellitus with stage 4 chronic kidney disease, with long-term current use of insulin  (HCC) Assessment & Plan: Managed with Lantus ,  Mounjaro, and occasional Humalog . A1c 7.5% in March, 30-day average glucose 167 mg/dL. No recent hypoglycemia with current Lantus  dosing. Emphasized protein intake and strength training to prevent muscle atrophy on Mounjaro. - Adjust Lantus  dosing to 10-15 units morning and evening based on glucose levels. - Continue Mounjaro at 7.5 mg. - Monitor glucose levels and adjust Lantus  if consistently above 120-130 mg/dL. - Encourage protein intake and strength training. - Renew Dexcom prescription.  Orders: -     Lantus  SoloStar; Inject 15 units sq nightly  Hypertensive nephropathy Assessment & Plan: Chronic, fair control. Goal BP<120/80.  She will continue with nifedipine  per transplant team. She is encouraged to follow low sodium diet.   Orders: -     CMP14+EGFR  Hypoglycemia associated with diabetes (HCC) Assessment & Plan: Previously occurred at night with higher Lantus  doses. No recent episodes with current dosing. - Monitor for nocturnal hypoglycemia. - Document hypoglycemic episodes for Dexcom coverage.   Class 2 severe obesity due to excess calories with serious comorbidity and body mass index (BMI) of 36.0 to 36.9 in adult [facility]) Assessment & Plan: She is encouraged to strive for BMI less than 30 to decrease cardiac risk. Advised to aim for at least 150 minutes of exercise per week.    Kidney transplanted Assessment & Plan: Transplant three years ago, managed with Prograf  and regular nephrology follow-ups. No complications reported. - Continue Prograf  and prednisone  as prescribed. - Order liver and kidney function tests. - Follow up with nephrologist at the end of May.   Other orders -     Mounjaro; Inject 7.5 mg into the skin once a week.   Return for 4 month dm f/u.Aaron Aas  Patient was given opportunity to ask questions. Patient verbalized understanding of the plan and was able to repeat key elements of the plan. All questions were answered to their satisfaction.    I,  Smiley Dung, MD, have reviewed all documentation for this visit. The documentation on 08/29/23 for the exam, diagnosis, procedures, and orders are all accurate and complete.   IF YOU HAVE BEEN REFERRED TO A SPECIALIST, IT MAY TAKE 1-2 WEEKS TO SCHEDULE/PROCESS THE REFERRAL. IF YOU HAVE NOT HEARD FROM US /SPECIALIST IN TWO WEEKS, PLEASE GIVE US  A CALL AT [phone] X [phone].   THE PATIENT IS ENCOURAGED  TO PRACTICE SOCIAL DISTANCING DUE TO THE COVID-19 PANDEMIC.

## 2023-08-30 ENCOUNTER — Encounter: Payer: Self-pay | Admitting: Internal Medicine

## 2023-08-30 LAB — CMP14+EGFR
ALT: 16 IU/L (ref 0–32)
AST: 16 IU/L (ref 0–40)
Albumin: 3.8 g/dL (ref 3.8–4.9)
Alkaline Phosphatase: 121 IU/L (ref 44–121)
BUN/Creatinine Ratio: 19 (ref 9–23)
BUN: 51 mg/dL — ABNORMAL HIGH (ref 6–24)
Bilirubin Total: <0.2 mg/dL (ref 0.0–1.2)
CO2: 19 mmol/L — ABNORMAL LOW (ref 20–29)
Calcium: 9.3 mg/dL (ref 8.7–10.2)
Chloride: 110 mmol/L — ABNORMAL HIGH (ref 96–106)
Creatinine, Ser: 2.65 mg/dL — ABNORMAL HIGH (ref 0.57–1.00)
Globulin, Total: 3.6 g/dL (ref 1.5–4.5)
Glucose: 148 mg/dL — ABNORMAL HIGH (ref 70–99)
Potassium: 4.9 mmol/L (ref 3.5–5.2)
Sodium: 143 mmol/L (ref 134–144)
Total Protein: 7.4 g/dL (ref 6.0–8.5)
eGFR: 20 mL/min/{1.73_m2} — ABNORMAL LOW (ref >59)

## 2023-09-02 ENCOUNTER — Ambulatory Visit (HOSPITAL_COMMUNITY): Payer: Self-pay | Admitting: Certified Registered"

## 2023-09-02 ENCOUNTER — Encounter (HOSPITAL_COMMUNITY)
Admission: RE | Disposition: A | Discharge status: Home / Self Care | Payer: Self-pay | Source: Home / Self Care | Attending: Obstetrics and Gynecology

## 2023-09-02 ENCOUNTER — Ambulatory Visit (HOSPITAL_BASED_OUTPATIENT_CLINIC_OR_DEPARTMENT_OTHER): Payer: Self-pay | Admitting: Certified Registered"

## 2023-09-02 ENCOUNTER — Other Ambulatory Visit: Payer: Self-pay

## 2023-09-02 ENCOUNTER — Ambulatory Visit (HOSPITAL_COMMUNITY)
Admission: RE | Admit: 2023-09-02 | Discharge: 2023-09-02 | Disposition: A | Discharge status: Home / Self Care | Payer: Medicare HMO | Attending: Obstetrics and Gynecology | Admitting: Obstetrics and Gynecology

## 2023-09-02 ENCOUNTER — Encounter (HOSPITAL_COMMUNITY): Payer: Self-pay | Admitting: Obstetrics and Gynecology

## 2023-09-02 DIAGNOSIS — D631 Anemia in chronic kidney disease: Secondary | ICD-10-CM | POA: Insufficient documentation

## 2023-09-02 DIAGNOSIS — I120 Hypertensive chronic kidney disease with stage 5 chronic kidney disease or end stage renal disease: Secondary | ICD-10-CM | POA: Insufficient documentation

## 2023-09-02 DIAGNOSIS — Z794 Long term (current) use of insulin: Secondary | ICD-10-CM | POA: Insufficient documentation

## 2023-09-02 DIAGNOSIS — N871 Moderate cervical dysplasia: Secondary | ICD-10-CM

## 2023-09-02 DIAGNOSIS — Z940 Kidney transplant status: Secondary | ICD-10-CM | POA: Diagnosis not present

## 2023-09-02 DIAGNOSIS — E1122 Type 2 diabetes mellitus with diabetic chronic kidney disease: Secondary | ICD-10-CM | POA: Insufficient documentation

## 2023-09-02 DIAGNOSIS — E1121 Type 2 diabetes mellitus with diabetic nephropathy: Secondary | ICD-10-CM

## 2023-09-02 DIAGNOSIS — Z79899 Other long term (current) drug therapy: Secondary | ICD-10-CM | POA: Diagnosis not present

## 2023-09-02 DIAGNOSIS — N870 Mild cervical dysplasia: Secondary | ICD-10-CM | POA: Diagnosis present

## 2023-09-02 DIAGNOSIS — R808 Other proteinuria: Secondary | ICD-10-CM | POA: Diagnosis not present

## 2023-09-02 DIAGNOSIS — I10 Essential (primary) hypertension: Secondary | ICD-10-CM

## 2023-09-02 HISTORY — PX: LEEP: SHX91

## 2023-09-02 LAB — GLUCOSE, CAPILLARY
Glucose-Capillary: 104 mg/dL — ABNORMAL HIGH (ref 70–99)
Glucose-Capillary: 146 mg/dL — ABNORMAL HIGH (ref 70–99)

## 2023-09-02 SURGERY — LEEP (LOOP ELECTROSURGICAL EXCISION PROCEDURE)
Anesthesia: General | Site: Vagina

## 2023-09-02 MED ORDER — ORAL CARE MOUTH RINSE: 15.0000 mL | Freq: Once | OROMUCOSAL | Status: AC

## 2023-09-02 MED ORDER — ACETAMINOPHEN 500 MG PO TABS: 1000.0000 mg | ORAL_TABLET | Freq: Once | ORAL | Status: AC

## 2023-09-02 MED ORDER — ONDANSETRON HCL 4 MG/2ML IJ SOLN
INTRAMUSCULAR | Status: AC
  Filled 2023-09-02: qty 2

## 2023-09-02 MED ORDER — MIDAZOLAM HCL 2 MG/2ML IJ SOLN
INTRAMUSCULAR | Status: AC
  Filled 2023-09-02: qty 2

## 2023-09-02 MED ORDER — OXYCODONE HCL 5 MG PO TABS: 5.0000 mg | ORAL_TABLET | Freq: Once | ORAL | Status: DC | PRN

## 2023-09-02 MED ORDER — CHLORHEXIDINE GLUCONATE 0.12 % MT SOLN
OROMUCOSAL | Status: AC
  Administered 2023-09-02: 15 mL via OROMUCOSAL
  Filled 2023-09-02: qty 15

## 2023-09-02 MED ORDER — POVIDONE-IODINE 10 % EX SWAB: 2.0000 | Freq: Once | CUTANEOUS | Status: DC

## 2023-09-02 MED ORDER — FENTANYL CITRATE (PF) 250 MCG/5ML IJ SOLN
INTRAMUSCULAR | Status: DC | PRN
Start: 2023-09-02 — End: 2023-09-02
  Administered 2023-09-02 (×2): 50 ug via INTRAVENOUS

## 2023-09-02 MED ORDER — CHLORHEXIDINE GLUCONATE 0.12 % MT SOLN: 15.0000 mL | Freq: Once | OROMUCOSAL | Status: AC

## 2023-09-02 MED ORDER — INSULIN ASPART 100 UNIT/ML IJ SOLN: 0.0000 [IU] | INTRAMUSCULAR | Status: DC | PRN

## 2023-09-02 MED ORDER — LIDOCAINE-EPINEPHRINE (PF) 1 %-1:200000 IJ SOLN
INTRAMUSCULAR | Status: AC
  Filled 2023-09-02: qty 30

## 2023-09-02 MED ORDER — SODIUM CHLORIDE 0.9 % IV SOLN: INTRAVENOUS | Status: DC

## 2023-09-02 MED ORDER — STERILE WATER FOR IRRIGATION IR SOLN
Status: DC | PRN
  Administered 2023-09-02: 1000 mL

## 2023-09-02 MED ORDER — ACETAMINOPHEN 500 MG PO TABS
ORAL_TABLET | ORAL | Status: AC
  Administered 2023-09-02: 1000 mg via ORAL
  Filled 2023-09-02: qty 2

## 2023-09-02 MED ORDER — HYDROMORPHONE HCL 1 MG/ML IJ SOLN: 0.2500 mg | INTRAMUSCULAR | Status: DC | PRN

## 2023-09-02 MED ORDER — PROPOFOL 10 MG/ML IV BOLUS
INTRAVENOUS | Status: DC | PRN
  Administered 2023-09-02: 200 mg via INTRAVENOUS

## 2023-09-02 MED ORDER — FENTANYL CITRATE (PF) 250 MCG/5ML IJ SOLN
INTRAMUSCULAR | Status: AC
  Filled 2023-09-02: qty 5

## 2023-09-02 MED ORDER — ONDANSETRON HCL 4 MG/2ML IJ SOLN
INTRAMUSCULAR | Status: DC | PRN
  Administered 2023-09-02: 4 mg via INTRAVENOUS

## 2023-09-02 MED ORDER — OXYCODONE HCL 5 MG/5ML PO SOLN: 5.0000 mg | Freq: Once | ORAL | Status: DC | PRN

## 2023-09-02 MED ORDER — LIDOCAINE-EPINEPHRINE (PF) 1 %-1:200000 IJ SOLN
INTRAMUSCULAR | Status: DC | PRN
  Administered 2023-09-02: 8 mL

## 2023-09-02 MED ORDER — IODINE STRONG (LUGOLS) 5 % PO SOLN
ORAL | Status: AC
  Filled 2023-09-02: qty 1

## 2023-09-02 MED ORDER — MONSELS FERRIC SUBSULFATE EX SOLN
CUTANEOUS | Status: DC | PRN
  Administered 2023-09-02: 1 via TOPICAL

## 2023-09-02 MED ORDER — LIDOCAINE 2% (20 MG/ML) 5 ML SYRINGE
INTRAMUSCULAR | Status: AC
  Filled 2023-09-02: qty 5

## 2023-09-02 MED ORDER — PROPOFOL 10 MG/ML IV BOLUS
INTRAVENOUS | Status: AC
  Filled 2023-09-02: qty 20

## 2023-09-02 MED ORDER — PHENYLEPHRINE 80 MCG/ML (10ML) SYRINGE FOR IV PUSH (FOR BLOOD PRESSURE SUPPORT)
PREFILLED_SYRINGE | INTRAVENOUS | Status: DC | PRN
  Administered 2023-09-02: 80 ug via INTRAVENOUS

## 2023-09-02 MED ORDER — MONSELS FERRIC SUBSULFATE EX SOLN
CUTANEOUS | Status: AC
  Filled 2023-09-02: qty 8

## 2023-09-02 MED ORDER — DEXAMETHASONE SODIUM PHOSPHATE 10 MG/ML IJ SOLN
INTRAMUSCULAR | Status: AC
  Filled 2023-09-02: qty 1

## 2023-09-02 MED ORDER — ONDANSETRON HCL 4 MG/2ML IJ SOLN: 4.0000 mg | Freq: Once | INTRAMUSCULAR | Status: DC | PRN

## 2023-09-02 MED ORDER — LIDOCAINE 2% (20 MG/ML) 5 ML SYRINGE
INTRAMUSCULAR | Status: DC | PRN
  Administered 2023-09-02: 100 mg via INTRAVENOUS

## 2023-09-02 MED ORDER — ACETIC ACID 5 % SOLN
Status: AC
  Filled 2023-09-02: qty 50

## 2023-09-02 SURGICAL SUPPLY — 17 items
ELECT BALL LEEP 5MM RED (ELECTRODE) IMPLANT
ELECTRODE LOOP LP RND 20X12WHT (CUTTING LOOP) IMPLANT
GLOVE BIOGEL PI IND STRL 6.5 (GLOVE) ×2 IMPLANT
GLOVE BIOGEL PI IND STRL 7.0 (GLOVE) ×1 IMPLANT
GLOVE ECLIPSE 6.5 STRL STRAW (GLOVE) ×1 IMPLANT
GLOVE SURG SYN 6.5 ES PF (GLOVE) ×1 IMPLANT
GLOVE SURG SYN 6.5 PF PI (GLOVE) IMPLANT
GOWN STRL REUS W/ TWL LRG LVL3 (GOWN DISPOSABLE) ×2 IMPLANT
MANIFOLD NEPTUNE II (INSTRUMENTS) IMPLANT
NS IRRIG 1000ML POUR BTL (IV SOLUTION) ×1 IMPLANT
PACK VAGINAL WOMENS (CUSTOM PROCEDURE TRAY) IMPLANT
PAD OB MATERNITY 11 LF (PERSONAL CARE ITEMS) ×1 IMPLANT
SCOPETTES 8 STERILE (MISCELLANEOUS) ×2 IMPLANT
SOL PREP POV-IOD 4OZ 10% (MISCELLANEOUS) IMPLANT
SPIKE FLUID TRANSFER (MISCELLANEOUS) IMPLANT
SUT ETHILON 3 0 PS 1 18 (SUTURE) IMPLANT
TOWEL GREEN STERILE FF (TOWEL DISPOSABLE) ×2 IMPLANT

## 2023-09-02 NOTE — Anesthesia Preprocedure Evaluation (Addendum)
 Anesthesia Evaluation  Patient identified by MRN, date of birth, ID band Patient awake    Reviewed: Allergy & Precautions, H&P , NPO status , Patient's Chart, lab work & pertinent test results  Airway Mallampati: IV  TM Distance: >3 FB Neck ROM: Full    Dental  (+) Teeth Intact, Dental Advisory Given   Pulmonary neg pulmonary ROS Snores at night, no sleep study   Pulmonary exam normal breath sounds clear to auscultation       Cardiovascular hypertension (150/57 preop, per pt normally 130s SBP), Pt. on medications Normal cardiovascular exam Rhythm:Regular Rate:Normal  Cath 2017  LV end diastolic pressure is mildly elevated at 19 mm Hg. No LV gram performed to preserve contrast.  Normal coronary arteries, right dominant. 15 mL contrast used.  Patient's findings are consistent with nonischemic hypertensive heart disease.   Patient be discharged home today, with outpatient follow-up. Needs better control of blood pressure.    Neuro/Psych negative neurological ROS  negative psych ROS   GI/Hepatic negative GI ROS, Neg liver ROS,,,  Endo/Other  diabetes, Well Controlled, Type 2, Insulin  Dependent  BMI 36 Lantus  last night 6U (1/2)   Renal/GU CRFRenal diseaseCr 2.65 S/p kidney transplant 2021  negative genitourinary   Musculoskeletal negative musculoskeletal ROS (+)    Abdominal  (+) + obese  Peds negative pediatric ROS (+)  Hematology negative hematology ROS (+)   Anesthesia Other Findings Mounjaro LD:   Reproductive/Obstetrics negative OB ROS                             Anesthesia Physical Anesthesia Plan  ASA: 2  Anesthesia Plan: General   Post-op Pain Management: Tylenol  PO (pre-op)* and Toradol IV (intra-op)*   Induction: Intravenous  PONV Risk Score and Plan: 4 or greater and Ondansetron , Dexamethasone , Midazolam  and Treatment may vary due to age or medical condition  Airway  Management Planned: LMA  Additional Equipment: None  Intra-op Plan:   Post-operative Plan: Extubation in OR  Informed Consent: I have reviewed the patients History and Physical, chart, labs and discussed the procedure including the risks, benefits and alternatives for the proposed anesthesia with the patient or authorized representative who has indicated his/her understanding and acceptance.     Dental advisory given  Plan Discussed with: CRNA  Anesthesia Plan Comments:        Anesthesia Quick Evaluation

## 2023-09-02 NOTE — Op Note (Signed)
 09/02/2023  8:08 AM  PATIENT:  Samantha [name]  58 y.o. female  PRE-OPERATIVE DIAGNOSIS:  cervical intraepithelial neoplasia 1-2  POST-OPERATIVE DIAGNOSIS:  cervical intraepithelial neoplasia 1-2  PROCEDURE:  Procedure(s): LEEP (LOOP ELECTROSURGICAL EXCISION PROCEDURE) (N/A)  SURGEON:  Surgeons and Role:    Dene Fines, Rizwan Kuyper, DO - Primary  ANESTHESIA:   IV sedation  EBL:  5 mL   LOCAL MEDICATIONS USED:  LIDOCAINE   and Amount: 8 ml  SPECIMEN:  Source of Specimen:  portio of cervix and endocervical curettings  DISPOSITION OF SPECIMEN:  PATHOLOGY  COUNTS:  YES  PLAN OF CARE: Discharge to home after PACU  PATIENT DISPOSITION:  PACU - hemodynamically stable.   Delay start of Pharmacological VTE agent (>24hrs) due to surgical blood loss or risk of bleeding: not applicable  FINDINGS: normal vulva, vagina, cervix.  No obvious cervical lesions  DESCRIPTION OF PROCEDURE:  Patient was taken to the operating room where anesthesia was administered and found to be adequate.  She was prepped vaginally with providione and draped in the  dorsal lithotomy position.  A coated speculum was placed and with good visualization of the cervix, it was grasped posteriorly to start.  Lidocaine  with epi was instilled circumferentially around the face of the cervix.  The tenaculum was switched to the anterior aspect as it superficially tore out of the posterior aspect given minimal tissue to get good purchase.  The posterior aspect was found to be hemostatic without suture.  A wide loop was used to make one pass with settings of 50/50 blend.  The specimen was removed, tagged at 12 o'clock and sent to pathology.  ECC was collected and also sent.  Cautery ball was used to cauterized edges with excellent hemostasis.  Monsels was applied and no bleeding noted.  Tenaculum was removed and not bleeding noted.  All other instruments were removed.  Patient tolerated the procedure well, sponge lap and needle  counts were correct x 2.  Patient was taken to recovery in stable position.

## 2023-09-02 NOTE — Anesthesia Procedure Notes (Signed)
 Procedure Name: LMA Insertion Date/Time: 09/02/2023 7:38 AM  Performed by: Bennett Brass, CRNAPre-anesthesia Checklist: Patient identified, Emergency Drugs available, Suction available, Patient being monitored and Timeout performed Patient Re-evaluated:Patient Re-evaluated prior to induction Oxygen Delivery Method: Circle system utilized Preoxygenation: Pre-oxygenation with 100% oxygen Induction Type: IV induction LMA Size: 4.0 Number of attempts: 1

## 2023-09-02 NOTE — Discharge Instructions (Signed)
     No acetaminophen /Tylenol  until after 12:40 pm today if needed.     Post Anesthesia Home Care Instructions  Activity: Get plenty of rest for the remainder of the day. A responsible individual must stay with you for 24 hours following the procedure.  For the next 24 hours, DO NOT: -Drive a car -Advertising copywriter -Drink alcoholic beverages -Take any medication unless instructed by your physician -Make any legal decisions or sign important papers.  Meals: Start with liquid foods such as gelatin or soup. Progress to regular foods as tolerated. Avoid greasy, spicy, heavy foods. If nausea and/or vomiting occur, drink only clear liquids until the nausea and/or vomiting subsides. Call your physician if vomiting continues.  Special Instructions/Symptoms: Your throat may feel dry or sore from the anesthesia or the breathing tube placed in your throat during surgery. If this causes discomfort, gargle with warm salt water. The discomfort should disappear within 24 hours.

## 2023-09-02 NOTE — Anesthesia Postprocedure Evaluation (Signed)
 Anesthesia Post Note  Patient: Renate Danh  Procedure(s) Performed: LEEP (LOOP ELECTROSURGICAL EXCISION PROCEDURE) (Vagina )     Patient location during evaluation: PACU Anesthesia Type: General Level of consciousness: awake and alert, oriented and patient cooperative Pain management: pain level controlled Vital Signs Assessment: post-procedure vital signs reviewed and stable Respiratory status: spontaneous breathing, nonlabored ventilation and respiratory function stable Cardiovascular status: blood pressure returned to baseline and stable Postop Assessment: no apparent nausea or vomiting Anesthetic complications: no   No notable events documented.  Last Vitals:  Vitals:   09/02/23 0830 09/02/23 0845  BP: (!) 130/57 (!) 147/58  Pulse: 63 63  Resp: 14 18  Temp:    SpO2: 91% 91%    Last Pain:  Vitals:   09/02/23 0845  TempSrc:   PainSc: 0-No pain                 Jacquelyne Matte

## 2023-09-02 NOTE — H&P (Signed)
 58 y.o. No obstetric history on file. Patient presents for procedure to remove cervical CIN2.  In 2019, pt had an ASCUS cannot r/o HG pap, colpo benign.  F.u pap in 2020 was Pap neg, HPV+, colpo showed HPV effect.  Pt did not f/u for next pap, but returned in 2023 with pap showing ASCUS HPV+, colpo was benign with ECC negative.  In 2024, pap showed LSIL and HPV 16+.  Colpo biopsy was CIN1 and ECC showed CIN2.   Past Medical History:  Diagnosis Date   Anemia in chronic kidney disease (CKD)    Chronic kidney disease, stage V (HCC)    Hypertension    Kidney transplanted    Uncontrolled diabetes mellitus with microalbuminuria or microproteinuria   BMI 36 CKD: now s/p kidney transplant in 2021 on 2 anti rejection medications and prednisone  with good kidney function.   History of colon polyps Hypercholesterolemia Past Surgical History:  Procedure Laterality Date   CARDIAC CATHETERIZATION N/A 03/23/2016   Procedure: Left Heart Cath and Coronary Angiography;  Surgeon: Knox Perl, MD;  Location: [facility] INVASIVE CV LAB;  Service: Cardiovascular;  Laterality: N/A;   CESAREAN SECTION     DILITATION & CURRETTAGE/HYSTROSCOPY WITH NOVASURE ABLATION N/A 10/24/2014   Procedure: DILATATION & CURETTAGE/HYSTEROSCOPY WITH NOVASURE ABLATION;  Surgeon: Artemisa Bile, MD;  Location: [facility] ORS;  Service: Gynecology;  Laterality: N/A;   KIDNEY TRANSPLANT  03/28/2020   [facility]   THYROIDECTOMY      Social History   Socioeconomic History   Marital status: Divorced    Spouse name: Not on file   Number of children: Not on file   Years of education: Not on file   Highest education level: Not on file  Occupational History   Not on file  Tobacco Use   Smoking status: Never   Smokeless tobacco: Never  Vaping Use   Vaping status: Never Used  Substance and Sexual Activity   Alcohol use: No   Drug use: No   Sexual activity: Not on file    Comment: Ablation  Other Topics Concern   Not on file  Social History  Narrative   Not on file   Social Drivers of Health   Financial Resource Strain: Low Risk  (04/14/2023)   Overall Financial Resource Strain (CARDIA)    Difficulty of Paying Living Expenses: Not hard at all  Food Insecurity: Low Risk  (08/01/2023)   Received from [facility]   Hunger Vital Sign    Worried About Running Out of Food in the Last Year: Never true    Ran Out of Food in the Last Year: Never true  Transportation Needs: No Transportation Needs (08/01/2023)   Received from [facility]    In the past 12 months, has lack of reliable transportation kept you from medical appointments, meetings, work or from getting things needed for daily living? : No  Physical Activity: Inactive (04/14/2023)   Exercise Vital Sign    Days of Exercise per Week: 0 days    Minutes of Exercise per Session: 0 min  Stress: No Stress Concern Present (04/14/2023)   [employer] of Occupational Health - Occupational Stress Questionnaire    Feeling of Stress : Not at all  Social Connections: Moderately Isolated (04/14/2023)   Social Connection and Isolation Panel [NHANES]    Frequency of Communication with Friends and Family: More than three times a week    Frequency of Social Gatherings with Friends and Family: Once a week  Attends Religious Services: Never    Active Member of Clubs or Organizations: Yes    Attends Banker Meetings: 1 to 4 times per year    Marital Status: Divorced  Intimate Partner Violence: Not At Risk (04/14/2023)   Humiliation, Afraid, Rape, and Kick questionnaire    Fear of Current or Ex-Partner: No    Emotionally Abused: No    Physically Abused: No    Sexually Abused: No    No current facility-administered medications on file prior to encounter.   Current Outpatient Medications on File Prior to Encounter  Medication Sig Dispense Refill   atorvastatin  (LIPITOR) 80 MG tablet Take 80 mg by mouth daily.     furosemide  (LASIX ) 20 MG tablet Take  20 mg by mouth daily.     insulin  lispro (HUMALOG  KWIKPEN) 100 UNIT/ML KwikPen Per sliding scale through [facility] transplant team 15 mL 11   NIFEdipine  (PROCARDIA  XL/NIFEDICAL XL) 60 MG 24 hr tablet Take 60 mg by mouth daily.     predniSONE  (DELTASONE ) 5 MG tablet Take 10 mg by mouth daily. Patient reports taking one tab daily     tacrolimus  (PROGRAF ) 1 MG capsule Take 3-4 mg by mouth See admin instructions. 2mg  in the morning, 2mg  in the evening     cyclobenzaprine  (FLEXERIL ) 5 MG tablet TAKE 1 TABLET BY MOUTH AT BEDTIME AS NEEDED FOR BACK/HIP PAIN 30 tablet 0   meclizine  (ANTIVERT ) 12.5 MG tablet Take 1 tablet (12.5 mg total) by mouth 3 (three) times daily as needed for dizziness. (Patient not taking: Reported on 08/29/2023) 30 tablet 0   nystatin  cream (MYCOSTATIN ) Apply 1 Application topically 2 (two) times daily. prn 30 g 1   sodium bicarbonate  650 MG tablet Take 1,300 mg by mouth 3 (three) times daily.      No Known Allergies  Vitals:   08/23/23 1438 09/02/23 0629 09/02/23 0714  BP:  (!) 178/67 (!) 150/57  Pulse:  67   Resp:  16   Temp:  98.5 F (36.9 C)   TempSrc:  Oral   SpO2:  98%   Weight: 106.6 kg 108 kg   Height: 5\' 8"  (1.727 m) 5\' 8"  (1.727 m)    Per recent office exam: Lungs: clear to ascultation Cor:  RRR Abdomen:  soft, nontender, nondistended. Ex:  no cords, erythema Pelvic:  deferred to OR  A/P: CIN 2 for Loop electrocautery excision procedure. All risks, benefits and alternatives d/w patient and she desires to proceed.  Pt has taken her anti-rejection medications and blood pressure medication. No abx needed SCDs in OR Other routine pre-op care Atlas Blank

## 2023-09-02 NOTE — Transfer of Care (Signed)
 Immediate Anesthesia Transfer of Care Note  Patient: Samantha [name]  Procedure(s) Performed: LEEP (LOOP ELECTROSURGICAL EXCISION PROCEDURE) (Vagina )  Patient Location: PACU  Anesthesia Type:General  Level of Consciousness: awake, alert , and oriented  Airway & Oxygen Therapy: Patient Spontanous Breathing and Patient connected to face mask oxygen  Post-op Assessment: Report given to RN and Post -op Vital signs reviewed and stable  Post vital signs: Reviewed and stable  Last Vitals:  Vitals Value Taken Time  BP 154/75 09/02/23 0815  Temp    Pulse 67 09/02/23 0817  Resp 19 09/02/23 0817  SpO2 91 % 09/02/23 0817  Vitals shown include unfiled device data.  Last Pain:  Vitals:   09/02/23 0629  TempSrc: Oral  PainSc: 0-No pain      Patients Stated Pain Goal: 4 (09/02/23 1308)  Complications: No notable events documented.

## 2023-09-03 ENCOUNTER — Encounter (HOSPITAL_COMMUNITY): Payer: Self-pay | Admitting: Obstetrics and Gynecology

## 2023-09-04 DIAGNOSIS — E11649 Type 2 diabetes mellitus with hypoglycemia without coma: Secondary | ICD-10-CM | POA: Insufficient documentation

## 2023-09-04 DIAGNOSIS — E1122 Type 2 diabetes mellitus with diabetic chronic kidney disease: Secondary | ICD-10-CM | POA: Insufficient documentation

## 2023-09-04 NOTE — Assessment & Plan Note (Signed)
 Managed with Lantus , Mounjaro, and occasional Humalog . A1c 7.5% in March, 30-day average glucose 167 mg/dL. No recent hypoglycemia with current Lantus  dosing. Emphasized protein intake and strength training to prevent muscle atrophy on Mounjaro. - Adjust Lantus  dosing to 10-15 units morning and evening based on glucose levels. - Continue Mounjaro at 7.5 mg. - Monitor glucose levels and adjust Lantus  if consistently above 120-130 mg/dL. - Encourage protein intake and strength training. - Renew Dexcom prescription.

## 2023-09-04 NOTE — Assessment & Plan Note (Signed)
 Previously occurred at night with higher Lantus  doses. No recent episodes with current dosing. - Monitor for nocturnal hypoglycemia. - Document hypoglycemic episodes for Dexcom coverage.

## 2023-09-04 NOTE — Assessment & Plan Note (Addendum)
 Transplant three years ago, managed with Prograf  and regular nephrology follow-ups. No complications reported. - Continue Prograf  and prednisone  as prescribed. - Order liver and kidney function tests. - Follow up with nephrologist at the end of May.

## 2023-09-04 NOTE — Assessment & Plan Note (Signed)
 Chronic,she is currently followed by Transplant team at [facility]. Her last A1c was 7.5 in March 2025.  I will have this abstracted into her chart. She will c/w Lantus  and  Mounjaro 7.5mg  weekly. Reminded to stop eating when full.  She is encouraged to gradually increase her daily activity.

## 2023-09-04 NOTE — Assessment & Plan Note (Signed)
 Chronic, fair control. Goal BP<120/80.  She will continue with nifedipine per transplant team. She is encouraged to follow low sodium diet.

## 2023-09-04 NOTE — Assessment & Plan Note (Signed)
 She is encouraged to strive for BMI less than 30 to decrease cardiac risk. Advised to aim for at least 150 minutes of exercise per week.

## 2023-09-06 LAB — SURGICAL PATHOLOGY

## 2023-09-30 DIAGNOSIS — H34231 Retinal artery branch occlusion, right eye: Secondary | ICD-10-CM | POA: Diagnosis not present

## 2023-09-30 DIAGNOSIS — H43822 Vitreomacular adhesion, left eye: Secondary | ICD-10-CM | POA: Diagnosis not present

## 2023-09-30 DIAGNOSIS — H4321 Crystalline deposits in vitreous body, right eye: Secondary | ICD-10-CM | POA: Diagnosis not present

## 2023-09-30 DIAGNOSIS — E113512 Type 2 diabetes mellitus with proliferative diabetic retinopathy with macular edema, left eye: Secondary | ICD-10-CM | POA: Diagnosis not present

## 2023-09-30 DIAGNOSIS — H3582 Retinal ischemia: Secondary | ICD-10-CM | POA: Diagnosis not present

## 2023-09-30 DIAGNOSIS — H31012 Macula scars of posterior pole (postinflammatory) (post-traumatic), left eye: Secondary | ICD-10-CM | POA: Diagnosis not present

## 2023-09-30 DIAGNOSIS — E113591 Type 2 diabetes mellitus with proliferative diabetic retinopathy without macular edema, right eye: Secondary | ICD-10-CM | POA: Diagnosis not present

## 2023-10-06 DIAGNOSIS — Z940 Kidney transplant status: Secondary | ICD-10-CM | POA: Diagnosis not present

## 2023-10-06 DIAGNOSIS — I129 Hypertensive chronic kidney disease with stage 1 through stage 4 chronic kidney disease, or unspecified chronic kidney disease: Secondary | ICD-10-CM | POA: Diagnosis not present

## 2023-10-06 DIAGNOSIS — N185 Chronic kidney disease, stage 5: Secondary | ICD-10-CM | POA: Diagnosis not present

## 2023-10-06 DIAGNOSIS — N2581 Secondary hyperparathyroidism of renal origin: Secondary | ICD-10-CM | POA: Diagnosis not present

## 2023-10-06 DIAGNOSIS — E8770 Fluid overload, unspecified: Secondary | ICD-10-CM | POA: Diagnosis not present

## 2023-10-06 DIAGNOSIS — Z79899 Other long term (current) drug therapy: Secondary | ICD-10-CM | POA: Diagnosis not present

## 2023-10-06 DIAGNOSIS — E1122 Type 2 diabetes mellitus with diabetic chronic kidney disease: Secondary | ICD-10-CM | POA: Diagnosis not present

## 2023-10-08 LAB — LAB REPORT - SCANNED
A1c: 7.3
Albumin, Urine POC: 7
Creatinine, POC: 33.2 mg/dL
EGFR: 23
Microalb Creat Ratio: 21

## 2023-10-13 DIAGNOSIS — E1122 Type 2 diabetes mellitus with diabetic chronic kidney disease: Secondary | ICD-10-CM | POA: Diagnosis not present

## 2023-10-25 DIAGNOSIS — E113591 Type 2 diabetes mellitus with proliferative diabetic retinopathy without macular edema, right eye: Secondary | ICD-10-CM | POA: Diagnosis not present

## 2023-11-23 NOTE — Pre-Procedure Instructions (Signed)
 Surgical Instructions   Your procedure is scheduled on November 29, 2023. Report to [facility] Main Entrance A at 9:00 A.M., then check in with the Admitting office. Any questions or running late day of surgery: call [phone]  Questions prior to your surgery date: call [phone], Monday-Friday, 8am-4pm. If you experience any cold or flu symptoms such as cough, fever, chills, shortness of breath, etc. between now and your scheduled surgery, please notify us  at the above number.     Remember:  Do not eat or drink after midnight the night before your surgery    Take these medicines the morning of surgery with A SIP OF WATER : azaTHIOprine (IMURAN)  Azathioprine  carvedilol (COREG)  predniSONE  (DELTASONE )  tacrolimus  (PROGRAF )    One week prior to surgery, STOP taking any Aspirin  (unless otherwise instructed by your surgeon) Aleve, Naproxen, Ibuprofen , Motrin , Advil , Goody's, BC's, all herbal medications, fish oil, and non-prescription vitamins.   WHAT DO I DO ABOUT MY DIABETES MEDICATION?  THE NIGHT BEFORE SURGERY, take 10 units of insulin  glargine (LANTUS  SOLOSTAR).   STOP taking your tirzepatide  (MOUNJARO ) one week prior to surgery. DO NOT take any doses after July 14th.   DO NOT take your bedtime dose of insulin  lispro (HUMALOG  KWIKPEN) the night before surgery.    THE MORNING OF SURGERY, take 10 units of insulin  glargine (LANTUS  SOLOSTAR) .  If your CBG is greater than 220 mg/dL, you may take  of your sliding scale (correction) dose of insulin  lispro (HUMALOG  KWIKPEN).   HOW TO MANAGE YOUR DIABETES BEFORE AND AFTER SURGERY  Why is it important to control my blood sugar before and after surgery? Improving blood sugar levels before and after surgery helps healing and can limit problems. A way of improving blood sugar control is eating a healthy diet by:  Eating less sugar and carbohydrates  Increasing activity/exercise  Talking with your doctor about reaching your  blood sugar goals High blood sugars (greater than 180 mg/dL) can raise your risk of infections and slow your recovery, so you will need to focus on controlling your diabetes during the weeks before surgery. Make sure that the doctor who takes care of your diabetes knows about your planned surgery including the date and location.  How do I manage my blood sugar before surgery? Check your blood sugar at least 4 times a day, starting 2 days before surgery, to make sure that the level is not too high or low.  Check your blood sugar the morning of your surgery when you wake up and every 2 hours until you get to the Short Stay unit.  If your blood sugar is less than 70 mg/dL, you will need to treat for low blood sugar: Do not take insulin . Treat a low blood sugar (less than 70 mg/dL) with  cup of clear juice (cranberry or apple), 4 glucose tablets, OR glucose gel. Recheck blood sugar in 15 minutes after treatment (to make sure it is greater than 70 mg/dL). If your blood sugar is not greater than 70 mg/dL on recheck, call [phone] for further instructions. Report your blood sugar to the short stay nurse when you get to Short Stay.  If you are admitted to the hospital after surgery: Your blood sugar will be checked by the staff and you will probably be given insulin  after surgery (instead of oral diabetes medicines) to make sure you have good blood sugar levels. The goal for blood sugar control after surgery is 80-180 mg/dL.  Do NOT Smoke (Tobacco/Vaping) for 24 hours prior to your procedure.  If you use a CPAP at night, you may bring your mask/headgear for your overnight stay.   You will be asked to remove any contacts, glasses, piercing's, hearing aid's, dentures/partials prior to surgery. Please bring cases for these items if needed.    Patients discharged the day of surgery will not be allowed to drive home, and someone needs to stay with them for 24 hours.  SURGICAL  WAITING ROOM VISITATION Patients may have no more than 2 support people in the waiting area - these visitors may rotate.   Pre-op nurse will coordinate an appropriate time for 1 ADULT support person, who may not rotate, to accompany patient in pre-op.  Children under the age of 23 must have an adult with them who is not the patient and must remain in the main waiting area with an adult.  If the patient needs to stay at the hospital during part of their recovery, the visitor guidelines for inpatient rooms apply.  Please refer to the [facility] website for the visitor guidelines for any additional information.   If you received a COVID test during your pre-op visit  it is requested that you wear a mask when out in public, stay away from anyone that may not be feeling well and notify your surgeon if you develop symptoms. If you have been in contact with anyone that has tested positive in the last 10 days please notify you surgeon.      Pre-operative CHG Bathing Instructions   You can play a key role in reducing the risk of infection after surgery. Your skin needs to be as free of germs as possible. You can reduce the number of germs on your skin by washing with CHG (chlorhexidine  gluconate) soap before surgery. CHG is an antiseptic soap that kills germs and continues to kill germs even after washing.   DO NOT use if you have an allergy to chlorhexidine /CHG or antibacterial soaps. If your skin becomes reddened or irritated, stop using the CHG and notify one of our RNs at [phone].              TAKE A SHOWER THE NIGHT BEFORE SURGERY AND THE DAY OF SURGERY    Please keep in mind the following:  DO NOT shave, including legs and underarms, 48 hours prior to surgery.   You may shave your face before/day of surgery.  Place clean sheets on your bed the night before surgery Use a clean washcloth (not used since being washed) for each shower. DO NOT sleep with pet's night before surgery.  CHG  Shower Instructions:  Wash your face and private area with normal soap. If you choose to wash your hair, wash first with your normal shampoo.  After you use shampoo/soap, rinse your hair and body thoroughly to remove shampoo/soap residue.  Turn the water  OFF and apply half the bottle of CHG soap to a CLEAN washcloth.  Apply CHG soap ONLY FROM YOUR NECK DOWN TO YOUR TOES (washing for 3-5 minutes)  DO NOT use CHG soap on face, private areas, open wounds, or sores.  Pay special attention to the area where your surgery is being performed.  If you are having back surgery, having someone wash your back for you may be helpful. Wait 2 minutes after CHG soap is applied, then you may rinse off the CHG soap.  Pat dry with a clean towel  Put on clean pajamas  Additional instructions for the day of surgery: DO NOT APPLY any lotions, deodorants, cologne, or perfumes.   Do not wear jewelry or makeup Do not wear nail polish, gel polish, artificial nails, or any other type of covering on natural nails (fingers and toes) Do not bring valuables to the hospital. [facility] is not responsible for valuables/personal belongings. Put on clean/comfortable clothes.  Please brush your teeth.  Ask your nurse before applying any prescription medications to the skin.

## 2023-11-24 ENCOUNTER — Encounter (HOSPITAL_COMMUNITY): Payer: Self-pay

## 2023-11-24 ENCOUNTER — Encounter (HOSPITAL_COMMUNITY)
Admission: RE | Admit: 2023-11-24 | Discharge: 2023-11-24 | Disposition: A | Discharge status: Home / Self Care | Source: Ambulatory Visit | Attending: Obstetrics and Gynecology | Admitting: Obstetrics and Gynecology

## 2023-11-24 ENCOUNTER — Other Ambulatory Visit: Payer: Self-pay

## 2023-11-24 VITALS — BP 148/64 | HR 79 | Temp 98.6°F | Resp 17 | Ht 68.0 in | Wt 229.7 lb

## 2023-11-24 DIAGNOSIS — E119 Type 2 diabetes mellitus without complications: Secondary | ICD-10-CM | POA: Insufficient documentation

## 2023-11-24 DIAGNOSIS — Z01812 Encounter for preprocedural laboratory examination: Secondary | ICD-10-CM | POA: Diagnosis present

## 2023-11-24 LAB — BASIC METABOLIC PANEL WITH GFR
Anion gap: 9 (ref 5–15)
BUN: 44 mg/dL — ABNORMAL HIGH (ref 6–20)
CO2: 21 mmol/L — ABNORMAL LOW (ref 22–32)
Calcium: 9.1 mg/dL (ref 8.9–10.3)
Chloride: 109 mmol/L (ref 98–111)
Creatinine, Ser: 3.08 mg/dL — ABNORMAL HIGH (ref 0.44–1.00)
GFR, Estimated: 17 mL/min — ABNORMAL LOW (ref >60)
Glucose, Bld: 170 mg/dL — ABNORMAL HIGH (ref 70–99)
Potassium: 4.4 mmol/L (ref 3.5–5.1)
Sodium: 139 mmol/L (ref 135–145)

## 2023-11-24 LAB — CBC
HCT: 39.3 % (ref 36.0–46.0)
Hemoglobin: 12.2 g/dL (ref 12.0–15.0)
MCH: 31.8 pg (ref 26.0–34.0)
MCHC: 31 g/dL (ref 30.0–36.0)
MCV: 102.3 fL — ABNORMAL HIGH (ref 80.0–100.0)
Platelets: 282 K/uL (ref 150–400)
RBC: 3.84 MIL/uL — ABNORMAL LOW (ref 3.87–5.11)
RDW: 15.7 % — ABNORMAL HIGH (ref 11.5–15.5)
WBC: 8.4 K/uL (ref 4.0–10.5)
nRBC: 0 % (ref 0.0–0.2)

## 2023-11-24 LAB — GLUCOSE, CAPILLARY: Glucose-Capillary: 161 mg/dL — ABNORMAL HIGH (ref 70–99)

## 2023-11-24 NOTE — Progress Notes (Addendum)
 PCP - Dr. Catheryn Slocumb Cardiologist - Denies - only saw cardiology for kidney transplant work-up Endocrinologist - Dr. Donnice Bogaert - last office visit 08/01/2023  PPM/ICD - Denies Device Orders - n/a Rep Notified - n/a  Chest x-ray - n/a EKG - 12/16/2022 Stress Test - 09/17/2019 (CE) ECHO - 09/17/2019 (CE) Cardiac Cath - 03/2016  Sleep Study - Denies CPAP - n/a  Pt is DM2. She check blood sugar multiple times per day (Dexcom7 on right arm). Normal fasting range is 60-90s. CBG at pre-op appointment 161. A1c 7.3 on 10/08/2023  Last dose of GLP1 agonist- Last dose of Mounjaro  was July 9th GLP1 instructions: Pt instructed to continue holding medication until after surgery  Blood Thinner Instructions: n/a Aspirin  Instructions: n/a  NPO after midnight  COVID TEST- n/a   Anesthesia review: Yes. Creatinine result 3.08  Patient denies shortness of breath, fever, cough and chest pain at PAT appointment. Pt denies any respiratory illness/infection in the last two months.    All instructions explained to the patient, with a verbal understanding of the material. Patient agrees to go over the instructions while at home for a better understanding. Patient also instructed to self quarantine after being tested for COVID-19. The opportunity to ask questions was provided.

## 2023-11-29 ENCOUNTER — Other Ambulatory Visit: Payer: Self-pay

## 2023-11-29 ENCOUNTER — Ambulatory Visit (HOSPITAL_COMMUNITY)
Admission: RE | Admit: 2023-11-29 | Discharge: 2023-11-29 | Disposition: A | Discharge status: Home / Self Care | Attending: Obstetrics and Gynecology | Admitting: Obstetrics and Gynecology

## 2023-11-29 ENCOUNTER — Ambulatory Visit (HOSPITAL_COMMUNITY): Payer: Self-pay | Admitting: Anesthesiology

## 2023-11-29 ENCOUNTER — Encounter (HOSPITAL_COMMUNITY): Payer: Self-pay | Admitting: Obstetrics and Gynecology

## 2023-11-29 ENCOUNTER — Ambulatory Visit (HOSPITAL_COMMUNITY): Payer: Self-pay | Admitting: Physician Assistant

## 2023-11-29 ENCOUNTER — Encounter (HOSPITAL_COMMUNITY)
Admission: RE | Disposition: A | Discharge status: Home / Self Care | Payer: Self-pay | Source: Home / Self Care | Attending: Obstetrics and Gynecology

## 2023-11-29 DIAGNOSIS — I10 Essential (primary) hypertension: Secondary | ICD-10-CM | POA: Insufficient documentation

## 2023-11-29 DIAGNOSIS — Z940 Kidney transplant status: Secondary | ICD-10-CM | POA: Diagnosis not present

## 2023-11-29 DIAGNOSIS — Z794 Long term (current) use of insulin: Secondary | ICD-10-CM | POA: Diagnosis not present

## 2023-11-29 DIAGNOSIS — Z6833 Body mass index (BMI) 33.0-33.9, adult: Secondary | ICD-10-CM | POA: Insufficient documentation

## 2023-11-29 DIAGNOSIS — N184 Chronic kidney disease, stage 4 (severe): Secondary | ICD-10-CM | POA: Diagnosis not present

## 2023-11-29 DIAGNOSIS — E1122 Type 2 diabetes mellitus with diabetic chronic kidney disease: Secondary | ICD-10-CM

## 2023-11-29 DIAGNOSIS — E119 Type 2 diabetes mellitus without complications: Secondary | ICD-10-CM

## 2023-11-29 DIAGNOSIS — N870 Mild cervical dysplasia: Secondary | ICD-10-CM | POA: Diagnosis not present

## 2023-11-29 DIAGNOSIS — I129 Hypertensive chronic kidney disease with stage 1 through stage 4 chronic kidney disease, or unspecified chronic kidney disease: Secondary | ICD-10-CM | POA: Diagnosis not present

## 2023-11-29 DIAGNOSIS — Z7985 Long-term (current) use of injectable non-insulin antidiabetic drugs: Secondary | ICD-10-CM | POA: Diagnosis not present

## 2023-11-29 DIAGNOSIS — N289 Disorder of kidney and ureter, unspecified: Secondary | ICD-10-CM | POA: Diagnosis not present

## 2023-11-29 DIAGNOSIS — Z79899 Other long term (current) drug therapy: Secondary | ICD-10-CM | POA: Insufficient documentation

## 2023-11-29 DIAGNOSIS — E669 Obesity, unspecified: Secondary | ICD-10-CM | POA: Diagnosis not present

## 2023-11-29 DIAGNOSIS — E1165 Type 2 diabetes mellitus with hyperglycemia: Secondary | ICD-10-CM | POA: Insufficient documentation

## 2023-11-29 DIAGNOSIS — N871 Moderate cervical dysplasia: Secondary | ICD-10-CM | POA: Insufficient documentation

## 2023-11-29 HISTORY — PX: CERVICAL CONIZATION W/BX: SHX1330

## 2023-11-29 LAB — GLUCOSE, CAPILLARY
Glucose-Capillary: 119 mg/dL — ABNORMAL HIGH (ref 70–99)
Glucose-Capillary: 126 mg/dL — ABNORMAL HIGH (ref 70–99)
Glucose-Capillary: 171 mg/dL — ABNORMAL HIGH (ref 70–99)

## 2023-11-29 SURGERY — CONE BIOPSY, CERVIX
Anesthesia: General | Site: Vagina

## 2023-11-29 MED ORDER — SODIUM CHLORIDE 0.9 % IV SOLN: INTRAVENOUS | Status: DC

## 2023-11-29 MED ORDER — PHENYLEPHRINE 80 MCG/ML (10ML) SYRINGE FOR IV PUSH (FOR BLOOD PRESSURE SUPPORT)
PREFILLED_SYRINGE | INTRAVENOUS | Status: DC | PRN
  Administered 2023-11-29: 80 ug via INTRAVENOUS
  Administered 2023-11-29 (×2): 120 ug via INTRAVENOUS
  Administered 2023-11-29: 80 ug via INTRAVENOUS

## 2023-11-29 MED ORDER — ALBUMIN HUMAN 5 % IV SOLN: INTRAVENOUS | Status: DC | PRN

## 2023-11-29 MED ORDER — FENTANYL CITRATE (PF) 250 MCG/5ML IJ SOLN
INTRAMUSCULAR | Status: DC | PRN
  Administered 2023-11-29: 50 ug via INTRAVENOUS
  Administered 2023-11-29 (×2): 25 ug via INTRAVENOUS

## 2023-11-29 MED ORDER — IODINE STRONG (LUGOLS) 5 % PO SOLN
ORAL | Status: AC
  Filled 2023-11-29: qty 1

## 2023-11-29 MED ORDER — ONDANSETRON HCL 4 MG/2ML IJ SOLN
INTRAMUSCULAR | Status: DC | PRN
  Administered 2023-11-29: 4 mg via INTRAVENOUS

## 2023-11-29 MED ORDER — MONSELS FERRIC SUBSULFATE EX SOLN
CUTANEOUS | Status: DC | PRN
  Administered 2023-11-29: 1 via TOPICAL

## 2023-11-29 MED ORDER — LIDOCAINE 2% (20 MG/ML) 5 ML SYRINGE
INTRAMUSCULAR | Status: DC | PRN
  Administered 2023-11-29: 80 mg via INTRAVENOUS

## 2023-11-29 MED ORDER — LIDOCAINE-EPINEPHRINE 1 %-1:100000 IJ SOLN
INTRAMUSCULAR | Status: DC | PRN
  Administered 2023-11-29: 20 mL

## 2023-11-29 MED ORDER — ACETIC ACID 5 % SOLN
Status: AC
Start: 2023-11-29 — End: 2023-11-29
  Filled 2023-11-29: qty 500

## 2023-11-29 MED ORDER — LIDOCAINE 2% (20 MG/ML) 5 ML SYRINGE
INTRAMUSCULAR | Status: AC
  Filled 2023-11-29: qty 5

## 2023-11-29 MED ORDER — HYDRALAZINE HCL 20 MG/ML IJ SOLN
INTRAMUSCULAR | Status: AC
  Filled 2023-11-29: qty 1

## 2023-11-29 MED ORDER — VASOPRESSIN 20 UNIT/ML IV SOLN
INTRAVENOUS | Status: AC
  Filled 2023-11-29: qty 1

## 2023-11-29 MED ORDER — INSULIN ASPART 100 UNIT/ML IJ SOLN: 0.0000 [IU] | INTRAMUSCULAR | Status: DC | PRN

## 2023-11-29 MED ORDER — LACTATED RINGERS IV SOLN: INTRAVENOUS | Status: DC

## 2023-11-29 MED ORDER — FENTANYL CITRATE (PF) 250 MCG/5ML IJ SOLN
INTRAMUSCULAR | Status: AC
  Filled 2023-11-29: qty 5

## 2023-11-29 MED ORDER — EPHEDRINE SULFATE-NACL 50-0.9 MG/10ML-% IV SOSY
PREFILLED_SYRINGE | INTRAVENOUS | Status: DC | PRN
  Administered 2023-11-29 (×4): 5 mg via INTRAVENOUS

## 2023-11-29 MED ORDER — PROPOFOL 10 MG/ML IV BOLUS
INTRAVENOUS | Status: DC | PRN
  Administered 2023-11-29: 150 mg via INTRAVENOUS

## 2023-11-29 MED ORDER — CHLORHEXIDINE GLUCONATE 0.12 % MT SOLN
15.0000 mL | Freq: Once | OROMUCOSAL | Status: AC
  Administered 2023-11-29: 15 mL via OROMUCOSAL
  Filled 2023-11-29: qty 15

## 2023-11-29 MED ORDER — ACETAMINOPHEN 500 MG PO TABS
1000.0000 mg | ORAL_TABLET | Freq: Once | ORAL | Status: AC
  Administered 2023-11-29: 1000 mg via ORAL
  Filled 2023-11-29: qty 2

## 2023-11-29 MED ORDER — ORAL CARE MOUTH RINSE: 15.0000 mL | Freq: Once | OROMUCOSAL | Status: AC

## 2023-11-29 MED ORDER — PROPOFOL 10 MG/ML IV BOLUS
INTRAVENOUS | Status: AC
  Filled 2023-11-29: qty 20

## 2023-11-29 MED ORDER — MIDAZOLAM HCL 2 MG/2ML IJ SOLN
INTRAMUSCULAR | Status: AC
  Filled 2023-11-29: qty 2

## 2023-11-29 MED ORDER — MIDAZOLAM HCL 2 MG/2ML IJ SOLN
INTRAMUSCULAR | Status: DC | PRN
  Administered 2023-11-29: 1 mg via INTRAVENOUS

## 2023-11-29 MED ORDER — FENTANYL CITRATE (PF) 100 MCG/2ML IJ SOLN: 25.0000 ug | INTRAMUSCULAR | Status: DC | PRN

## 2023-11-29 MED ORDER — MONSELS FERRIC SUBSULFATE EX SOLN
CUTANEOUS | Status: AC
Start: 2023-11-29 — End: 2023-11-29
  Filled 2023-11-29: qty 8

## 2023-11-29 MED ORDER — DEXAMETHASONE SODIUM PHOSPHATE 10 MG/ML IJ SOLN
INTRAMUSCULAR | Status: AC
  Filled 2023-11-29: qty 1

## 2023-11-29 MED ORDER — POVIDONE-IODINE 10 % EX SWAB
2.0000 | Freq: Once | CUTANEOUS | Status: AC
Start: 2023-11-29 — End: 2023-11-29
  Administered 2023-11-29: 2 via TOPICAL

## 2023-11-29 MED ORDER — LIDOCAINE-EPINEPHRINE 1 %-1:100000 IJ SOLN
INTRAMUSCULAR | Status: AC
  Filled 2023-11-29: qty 1

## 2023-11-29 MED ORDER — ONDANSETRON HCL 4 MG/2ML IJ SOLN: 4.0000 mg | Freq: Once | INTRAMUSCULAR | Status: DC | PRN

## 2023-11-29 MED ORDER — ROCURONIUM BROMIDE 10 MG/ML (PF) SYRINGE
PREFILLED_SYRINGE | INTRAVENOUS | Status: AC
  Filled 2023-11-29: qty 10

## 2023-11-29 MED ORDER — MONSELS FERRIC SUBSULFATE EX SOLN
CUTANEOUS | Status: AC
  Filled 2023-11-29: qty 8

## 2023-11-29 MED ORDER — DEXAMETHASONE SODIUM PHOSPHATE 10 MG/ML IJ SOLN
INTRAMUSCULAR | Status: DC | PRN
  Administered 2023-11-29: 10 mg via INTRAVENOUS

## 2023-11-29 MED ORDER — HYDRALAZINE HCL 20 MG/ML IJ SOLN
10.0000 mg | Freq: Once | INTRAMUSCULAR | Status: AC
  Administered 2023-11-29: 10 mg via INTRAVENOUS

## 2023-11-29 MED ORDER — PHENYLEPHRINE HCL-NACL 20-0.9 MG/250ML-% IV SOLN
INTRAVENOUS | Status: DC | PRN
  Administered 2023-11-29: 50 ug/min via INTRAVENOUS

## 2023-11-29 MED ORDER — ONDANSETRON HCL 4 MG/2ML IJ SOLN
INTRAMUSCULAR | Status: AC
  Filled 2023-11-29: qty 2

## 2023-11-29 MED ORDER — 0.9 % SODIUM CHLORIDE (POUR BTL) OPTIME
TOPICAL | Status: DC | PRN
Start: 2023-11-29 — End: 2023-11-29
  Administered 2023-11-29: 1000 mL

## 2023-11-29 SURGICAL SUPPLY — 19 items
APPLICATOR COTTON TIP 6 STRL (MISCELLANEOUS) ×1 IMPLANT
BLADE SURG 11 STRL SS (BLADE) ×1 IMPLANT
CATH SILICON 18FR 30CC (CATHETERS) IMPLANT
COVER MAYO STAND STRL (DRAPES) ×1 IMPLANT
ELECT BALL LEEP 5MM RED (ELECTRODE) ×1 IMPLANT
ELECTRODE LOOP LP RND 15X12GRN (CUTTING LOOP) IMPLANT
ELECTRODE LOOP LP SQR 10X10ORG (CUTTING LOOP) IMPLANT
ELECTRODE REM PT RTRN 9FT ADLT (ELECTROSURGICAL) ×1 IMPLANT
GLOVE BIOGEL PI IND STRL 6.5 (GLOVE) ×2 IMPLANT
GLOVE BIOGEL PI IND STRL 7.0 (GLOVE) ×1 IMPLANT
GLOVE ECLIPSE 6.5 STRL STRAW (GLOVE) ×1 IMPLANT
GOWN STRL REUS W/ TWL LRG LVL3 (GOWN DISPOSABLE) ×2 IMPLANT
NS IRRIG 1000ML POUR BTL (IV SOLUTION) ×1 IMPLANT
PACK VAGINAL MINOR WOMEN LF (CUSTOM PROCEDURE TRAY) ×1 IMPLANT
PAD OB MATERNITY 11 LF (PERSONAL CARE ITEMS) ×1 IMPLANT
PENCIL BUTTON HOLSTER BLD 10FT (ELECTRODE) ×1 IMPLANT
PENCIL SMOKE EVACUATOR (MISCELLANEOUS) IMPLANT
SCOPETTES 8 STERILE (MISCELLANEOUS) ×2 IMPLANT
TOWEL GREEN STERILE FF (TOWEL DISPOSABLE) ×2 IMPLANT

## 2023-11-29 NOTE — H&P (Signed)
 58 y.o. Patient presents for schedule cervical conization s/p prior LEEP.  Pathology or prior LEEP showed residual CIN2 positive margins at endocervix only.  Ectocervical margins were negative.  Past Medical History:  Diagnosis Date   Anemia in chronic kidney disease (CKD)    Chronic kidney disease, stage V (HCC)    s/p kidney transplant 03/28/2020   Hypertension    Kidney transplanted    Uncontrolled diabetes mellitus with microalbuminuria or microproteinuria    Past Surgical History:  Procedure Laterality Date   CARDIAC CATHETERIZATION N/A 03/23/2016   Procedure: Left Heart Cath and Coronary Angiography;  Surgeon: Gordy Bergamo, MD;  Location: [facility] INVASIVE CV LAB;  Service: Cardiovascular;  Laterality: N/A;   CATARACT EXTRACTION W/ INTRAOCULAR LENS IMPLANT Bilateral    CESAREAN SECTION  2005   CESAREAN SECTION  1993   DILITATION & CURRETTAGE/HYSTROSCOPY WITH NOVASURE ABLATION N/A 10/24/2014   Procedure: DILATATION & CURETTAGE/HYSTEROSCOPY WITH NOVASURE ABLATION;  Surgeon: Gigi Botts, MD;  Location: [facility] ORS;  Service: Gynecology;  Laterality: N/A;   KIDNEY TRANSPLANT  03/28/2020   [facility]   LEEP N/A 09/02/2023   Procedure: LEEP (LOOP ELECTROSURGICAL EXCISION PROCEDURE);  Surgeon: Lilton Legions, DO;  Location: [facility] OR;  Service: Gynecology;  Laterality: N/A;   THYROIDECTOMY  2009   TUBAL LIGATION  2005    Social History   Socioeconomic History   Marital status: Divorced    Spouse name: Not on file   Number of children: Not on file   Years of education: Not on file   Highest education level: Not on file  Occupational History   Not on file  Tobacco Use   Smoking status: Never   Smokeless tobacco: Never  Vaping Use   Vaping status: Never Used  Substance and Sexual Activity   Alcohol use: No   Drug use: No   Sexual activity: Not on file    Comment: Ablation  Other Topics Concern   Not on file  Social History Narrative   Not on file   Social Drivers of Health    Financial Resource Strain: Low Risk  (04/14/2023)   Overall Financial Resource Strain (CARDIA)    Difficulty of Paying Living Expenses: Not hard at all  Food Insecurity: Low Risk  (08/01/2023)   Received from [facility]   Hunger Vital Sign    Within the past 12 months, you worried that your food would run out before you got money to buy more: Never true    Within the past 12 months, the food you bought [name] didn't last and you didn't have money to get more. : Never true  Transportation Needs: No Transportation Needs (08/01/2023)   Received from [employer]    In the past 12 months, has lack of reliable transportation kept you from medical appointments, meetings, work or from getting things needed for daily living? : No  Physical Activity: Inactive (04/14/2023)   Exercise Vital Sign    Days of Exercise per Week: 0 days    Minutes of Exercise per Session: 0 min  Stress: No Stress Concern Present (04/14/2023)   [employer] of Occupational Health - Occupational Stress Questionnaire    Feeling of Stress : Not at all  Social Connections: Moderately Isolated (04/14/2023)   Social Connection and Isolation Panel    Frequency of Communication with Friends and Family: More than three times a week    Frequency of Social Gatherings with Friends and Family: Once a week  Attends Religious Services: Never    Active Member of Clubs or Organizations: Yes    Attends Banker Meetings: 1 to 4 times per year    Marital Status: Divorced  Intimate Partner Violence: Not At Risk (04/14/2023)   Humiliation, Afraid, Rape, and Kick questionnaire    Fear of Current or Ex-Partner: No    Emotionally Abused: No    Physically Abused: No    Sexually Abused: No    No current facility-administered medications on file prior to encounter.   Current Outpatient Medications on File Prior to Encounter  Medication Sig Dispense Refill   atorvastatin  (LIPITOR) 80 MG tablet Take 80 mg  by mouth every evening.     azaTHIOprine (IMURAN) 50 MG tablet Take 50 mg by mouth in the morning. 50 mg + 75 mg=125 mg daily     Azathioprine 75 MG TABS Take 75 mg by mouth in the morning. 75 mg + 50 mg=125 mg daily     carvedilol (COREG) 6.25 MG tablet Take 6.25 mg by mouth in the morning and at bedtime.     insulin  glargine (LANTUS  SOLOSTAR) 100 UNIT/ML Solostar Pen Inject 15 units sq nightly (Patient taking differently: Inject 20 Units into the skin 2 (two) times daily.)     insulin  lispro (HUMALOG  KWIKPEN) 100 UNIT/ML KwikPen Per sliding scale through [facility] transplant team (Patient taking differently: Inject 0-10 Units into the skin in the morning, at noon, and at bedtime.) 15 mL 11   predniSONE  (DELTASONE ) 5 MG tablet Take 5 mg by mouth in the morning.     sulfamethoxazole-trimethoprim (BACTRIM) 400-80 MG tablet Take 1 tablet by mouth every Monday, Wednesday, and Friday. In the morning.     tacrolimus  (PROGRAF ) 1 MG capsule Take 2 mg by mouth in the morning and at bedtime.     tirzepatide  (MOUNJARO ) 10 MG/0.5ML Pen Inject 10 mg into the skin every Wednesday.     torsemide (DEMADEX) 20 MG tablet Take 20 mg by mouth in the morning.      No Known Allergies  Vitals:   11/29/23 0823  BP: (!) 156/65  Pulse: 65  Resp: 18  Temp: 98.7 F (37.1 C)  TempSrc: Oral  SpO2: 97%  Weight: 99.8 kg  Height: 5' 8 (1.727 m)   Per recent office exam Lungs: clear to ascultation Cor:  RRR Abdomen:  soft, nontender, nondistended. Ex:  no cords, erythema Pelvic:  deferred to OR  A/P: CIN2 at endocervica margin of LEEP speciman. We discussed continued surveilance vs. Repeat LEEP or CKC.  Discussed risks/benefits of options.  Will proceed with LEEP and utilize high hat. Discussed patients longterm daily Predinisone of 5mg  today in setting or kidney tranplant, DM,HTN. Other routine care.   Samantha [name]

## 2023-11-29 NOTE — Anesthesia Procedure Notes (Signed)
 Procedure Name: LMA Insertion Date/Time: 11/29/2023 11:17 AM  Performed by: Worth Peppers, CRNAPre-anesthesia Checklist: Patient identified, Emergency Drugs available, Suction available and Patient being monitored Patient Re-evaluated:Patient Re-evaluated prior to induction Oxygen Delivery Method: Circle System Utilized Preoxygenation: Pre-oxygenation with 100% oxygen Induction Type: IV induction Ventilation: Mask ventilation without difficulty LMA: LMA inserted LMA Size: 4.0 Number of attempts: 1 Airway Equipment and Method: Bite block Placement Confirmation: positive ETCO2 Tube secured with: Tape Dental Injury: Teeth and Oropharynx as per pre-operative assessment

## 2023-11-29 NOTE — Anesthesia Preprocedure Evaluation (Signed)
 Anesthesia Evaluation  Patient identified by MRN, date of birth, ID band Patient awake    Reviewed: Allergy & Precautions, NPO status , Patient's Chart, lab work & pertinent test results, reviewed documented beta blocker date and time   Airway Mallampati: II  TM Distance: >3 FB Neck ROM: Full    Dental  (+) Teeth Intact, Dental Advisory Given   Pulmonary neg pulmonary ROS   Pulmonary exam normal breath sounds clear to auscultation       Cardiovascular hypertension, Pt. on home beta blockers Normal cardiovascular exam Rhythm:Regular Rate:Normal     Neuro/Psych negative neurological ROS  negative psych ROS   GI/Hepatic negative GI ROS, Neg liver ROS,,,  Endo/Other  diabetes, Poorly Controlled, Type 2, Insulin  Dependent  Obesity   Renal/GU Renal InsufficiencyRenal diseaseS/p renal transplant      Musculoskeletal negative musculoskeletal ROS (+)    Abdominal   Peds  Hematology negative hematology ROS (+)   Anesthesia Other Findings Day of surgery medications reviewed with the patient.  Reproductive/Obstetrics Cervical intraepithelial neoplasia II                              Anesthesia Physical Anesthesia Plan  ASA: 3  Anesthesia Plan: General   Post-op Pain Management: Tylenol  PO (pre-op)*   Induction: Intravenous  PONV Risk Score and Plan: 1 and Midazolam , Dexamethasone  and Ondansetron   Airway Management Planned: LMA  Additional Equipment:   Intra-op Plan:   Post-operative Plan: Extubation in OR  Informed Consent: I have reviewed the patients History and Physical, chart, labs and discussed the procedure including the risks, benefits and alternatives for the proposed anesthesia with the patient or authorized representative who has indicated his/her understanding and acceptance.     Dental advisory given  Plan Discussed with: CRNA  Anesthesia Plan Comments:          Anesthesia Quick Evaluation

## 2023-11-29 NOTE — Anesthesia Postprocedure Evaluation (Signed)
 Anesthesia Post Note  Patient: Samantha [name]  Procedure(s) Performed: CONE BIOPSY, CERVIX (Vagina )     Patient location during evaluation: PACU Anesthesia Type: General Level of consciousness: awake and alert Pain management: pain level controlled Vital Signs Assessment: post-procedure vital signs reviewed and stable Respiratory status: spontaneous breathing, nonlabored ventilation and respiratory function stable Cardiovascular status: blood pressure returned to baseline and stable Postop Assessment: no apparent nausea or vomiting Anesthetic complications: no   No notable events documented.  Last Vitals:  Vitals:   11/29/23 1315 11/29/23 1320  BP: (!) 187/69 (!) 183/59  Pulse: 63 65  Resp: (!) 22 (!) 26  Temp:  36.5 C  SpO2: 95% 98%    Last Pain:  Vitals:   11/29/23 1300  TempSrc:   PainSc: 0-No pain                 Garnette FORBES Skillern

## 2023-11-29 NOTE — Op Note (Signed)
 11/29/2023  12:33 PM  PATIENT:  Samantha [name]  58 y.o. female  PRE-OPERATIVE DIAGNOSIS:  Cervical intraepithelial neoplasia II  POST-OPERATIVE DIAGNOSIS:  Cervical intraepithelial neoplasia II  PROCEDURE:  Procedure(s): CONE BIOPSY, CERVIX (N/A)  SURGEON:  Surgeons and Role:    DEWAINE Just, Darcia Lampi, DO - Primary  ANESTHESIA:   local and MAC  EBL:  5 mL   LOCAL MEDICATIONS USED:  LIDOCAINE   with Epi approximately 4cc pericervically  SPECIMEN:  Source of Specimen:  ECC, distal portion of cervix, proximal portion of cervix and endocervical canal tagged with silk through the endocervical os  DISPOSITION OF SPECIMEN:  PATHOLOGY  COUNTS:  YES  PLAN OF CARE: Discharge to home after PACU  PATIENT DISPOSITION:  PACU - hemodynamically stable.   Delay start of Pharmacological VTE agent (>24hrs) due to surgical blood loss or risk of bleeding: not applicable  DESCRIPTION OF PROCEDURE:  Patient was taken to the OR where anesthesia was administered and found to be adequate.  She was prepped and draped in normal sterile fashion in dorsal lithotomy position.  A coated speculum was placed and posterior aspect of the cervix grasped with Allis for better visualization. Minimal residual posterior cervical tissue present.  A sound was used to attempt to locate and enter the scarred and atrophic external cervical os.  Entry not possible.  A small loop was used to excise a superficial portion of the distal cervix including the area of the os.  After removal, it was possible to located and enter the cervical os with a sound.  The high hat LEEP tool was then used to focally remove the endocervical canal to a depth of at least 1.5cm.  A second pass was performed to detach the cervical specimen.  Sound was used again to verify location of endocervical and final pass was performed to ensure entire area was adequate excised.  ECC was collected.  All specimens were sent to pathology with comment request to  review all margins. Hemostasis was adequate without further cautery.  Monsel's was placed prophylactically, tenaculum was removed.  Hemostasis assured and speculum then removed.  Patient tolerated the procedure well, sponge, lap and needle counts were correct x 2.  Patient was taken to recovery in stable condition.

## 2023-11-29 NOTE — Transfer of Care (Signed)
 Immediate Anesthesia Transfer of Care Note  Patient: Samantha [name]  Procedure(s) Performed: CONE BIOPSY, CERVIX  Patient Location: PACU  Anesthesia Type:General  Level of Consciousness: awake, alert , and oriented  Airway & Oxygen Therapy: Patient Spontanous Breathing and Patient connected to face mask oxygen  Post-op Assessment: Report given to RN and Post -op Vital signs reviewed and stable  Post vital signs: Reviewed and stable  Last Vitals:  Vitals Value Taken Time  BP 191/67 11/29/23 12:31  Temp    Pulse 62 11/29/23 12:34  Resp 13 11/29/23 12:34  SpO2 99 % 11/29/23 12:34  Vitals shown include unfiled device data.  Last Pain:  Vitals:   11/29/23 0905  TempSrc:   PainSc: 0-No pain         Complications: No notable events documented.

## 2023-11-30 ENCOUNTER — Encounter (HOSPITAL_COMMUNITY): Payer: Self-pay | Admitting: Obstetrics and Gynecology

## 2023-12-01 LAB — SURGICAL PATHOLOGY

## 2023-12-15 DIAGNOSIS — I151 Hypertension secondary to other renal disorders: Secondary | ICD-10-CM | POA: Diagnosis not present

## 2023-12-15 DIAGNOSIS — Z4822 Encounter for aftercare following kidney transplant: Secondary | ICD-10-CM | POA: Diagnosis not present

## 2023-12-15 DIAGNOSIS — Z940 Kidney transplant status: Secondary | ICD-10-CM | POA: Diagnosis not present

## 2023-12-15 DIAGNOSIS — E119 Type 2 diabetes mellitus without complications: Secondary | ICD-10-CM | POA: Diagnosis not present

## 2023-12-15 DIAGNOSIS — I10 Essential (primary) hypertension: Secondary | ICD-10-CM | POA: Diagnosis not present

## 2023-12-15 DIAGNOSIS — D849 Immunodeficiency, unspecified: Secondary | ICD-10-CM | POA: Diagnosis not present

## 2023-12-15 DIAGNOSIS — B259 Cytomegaloviral disease, unspecified: Secondary | ICD-10-CM | POA: Diagnosis not present

## 2023-12-15 DIAGNOSIS — Z5181 Encounter for therapeutic drug level monitoring: Secondary | ICD-10-CM | POA: Diagnosis not present

## 2023-12-15 DIAGNOSIS — Z794 Long term (current) use of insulin: Secondary | ICD-10-CM | POA: Diagnosis not present

## 2023-12-19 DIAGNOSIS — N871 Moderate cervical dysplasia: Secondary | ICD-10-CM | POA: Diagnosis not present

## 2023-12-21 ENCOUNTER — Ambulatory Visit: Payer: Medicare HMO | Admitting: Internal Medicine

## 2023-12-21 ENCOUNTER — Ambulatory Visit: Payer: Self-pay | Admitting: Internal Medicine

## 2023-12-21 ENCOUNTER — Encounter: Payer: Self-pay | Admitting: Internal Medicine

## 2023-12-21 VITALS — BP 134/82 | HR 62 | Temp 98.1°F | Ht 68.0 in | Wt 234.8 lb

## 2023-12-21 DIAGNOSIS — Z6835 Body mass index (BMI) 35.0-35.9, adult: Secondary | ICD-10-CM

## 2023-12-21 DIAGNOSIS — R600 Localized edema: Secondary | ICD-10-CM | POA: Diagnosis not present

## 2023-12-21 DIAGNOSIS — I129 Hypertensive chronic kidney disease with stage 1 through stage 4 chronic kidney disease, or unspecified chronic kidney disease: Secondary | ICD-10-CM

## 2023-12-21 DIAGNOSIS — Z0000 Encounter for general adult medical examination without abnormal findings: Secondary | ICD-10-CM

## 2023-12-21 DIAGNOSIS — Z794 Long term (current) use of insulin: Secondary | ICD-10-CM

## 2023-12-21 DIAGNOSIS — E66812 Obesity, class 2: Secondary | ICD-10-CM

## 2023-12-21 DIAGNOSIS — E1121 Type 2 diabetes mellitus with diabetic nephropathy: Secondary | ICD-10-CM

## 2023-12-21 DIAGNOSIS — Z940 Kidney transplant status: Secondary | ICD-10-CM | POA: Diagnosis not present

## 2023-12-21 LAB — POCT URINALYSIS DIP (CLINITEK)
Bilirubin, UA: NEGATIVE
Blood, UA: NEGATIVE
Glucose, UA: NEGATIVE mg/dL
Ketones, POC UA: NEGATIVE mg/dL
Leukocytes, UA: NEGATIVE
Nitrite, UA: NEGATIVE
POC PROTEIN,UA: NEGATIVE
Spec Grav, UA: 1.02 (ref 1.010–1.025)
Urobilinogen, UA: 0.2 U/dL
pH, UA: 5.5 (ref 5.0–8.0)

## 2023-12-21 NOTE — Patient Instructions (Signed)

## 2023-12-21 NOTE — Progress Notes (Signed)
 I,Victoria T Emmitt, CMA,acting as a Neurosurgeon for Catheryn LOISE Slocumb, MD.,have documented all relevant documentation on the behalf of Catheryn LOISE Slocumb, MD,as directed by  Catheryn LOISE Slocumb, MD while in the presence of Catheryn LOISE Slocumb, MD.  Subjective:  Patient ID: Samantha [name] , female    DOB: 01-05-1966 , 58 y.o.   MRN: [id number]  Chief Complaint  Patient presents with   Annual Exam    Patient presents today for annual exam. She reports compliance with medications. Denies headache, chest pain & sob.  GYN: Dr Lacinda Pang  She would like to discuss an opinion as to if she should get a complete hysterectomy or partial. Her GYN says to just remove her cervix, surgeon says to remove everything.    Diabetes   Hypertension    HPI Discussed the use of AI scribe software for clinical note transcription with the patient, who gave verbal consent to proceed.  History of Present Illness Samantha [name] is a 58 year old female with diabetes and a kidney transplant who presents for an annual physical exam.  She recently had a diabetes check at [facility] and visits the endocrinologist there once a year. Her last A1c was 6.3.  She underwent a gynecological procedure with Dr. Lacinda Just, where a benign area was found. A hysterectomy was suggested to remove the cervix. She had a consultation with another surgeon who recommended removing everything, including the ovaries and tubes, although she has not experienced menopause symptoms.  She has experienced some weight gain and swelling, noting a 14-pound increase and feeling swollen. Her physical activity has decreased over the summer, and she plans to increase walking to improve her recovery from the upcoming surgery.  She is currently taking atorvastatin  80 mg, azathioprine 50 mg and 75 mg, carvedilol twice a day, Lantus  20 units twice a day, Bactrim (sulfamethoxazole) Monday, Wednesday, and Friday, tacrolimus  2 mg twice a day, Mounjaro  10  mg, torsemide daily, and takes prednisone  only sporadically. She uses Humalog  before meals and has recently started taking Novolog .  She has experienced some swelling due to being off schedule with eating and sleeping, and not being able to elevate her legs. She has purchased compression hose but has not yet used them.  No calf pain when walking but increased swelling recently. No menopause symptoms such as hot flashes or night sweats.     Diabetes She presents for her follow-up diabetic visit. She has type 2 diabetes mellitus. Her disease course has been stable. Pertinent negatives for diabetes include no blurred vision, no polydipsia, no polyphagia and no polyuria. There are no hypoglycemic complications. Symptoms are improving. Diabetic complications include nephropathy. Risk factors for coronary artery disease include diabetes mellitus, dyslipidemia, hypertension, obesity and sedentary lifestyle. She is following a generally healthy diet. She participates in exercise intermittently. Her breakfast blood glucose range is generally 130-140 mg/dl. (120 blood sugar is the highest. Lunch 150-160 Dinner about 200 ) An ACE inhibitor/angiotensin II receptor blocker is contraindicated.  Hypertension This is a chronic problem. The current episode started more than 1 year ago. The problem has been gradually improving since onset. The problem is controlled. Pertinent negatives include no blurred vision. Past treatments include ACE inhibitors and diuretics. The current treatment provides moderate improvement. Compliance problems include exercise.  Hypertensive end-organ damage includes kidney disease.     Past Medical History:  Diagnosis Date   Anemia in chronic kidney disease (CKD)    Chronic kidney disease, stage V (  HCC)    s/p kidney transplant 03/28/2020   Hypertension    Kidney transplanted    Uncontrolled diabetes mellitus with microalbuminuria or microproteinuria      Family History  Problem  Relation Age of Onset   Hypertension Mother    Diabetes Mother    Kidney disease Mother    Crohn's disease Mother    Diabetes Father    Hypertension Father    Ulcers Father    Kidney disease Father      Current Outpatient Medications:    atorvastatin  (LIPITOR) 80 MG tablet, Take 80 mg by mouth every evening., Disp: , Rfl:    azaTHIOprine (IMURAN) 50 MG tablet, Take 50 mg by mouth in the morning. 50 mg + 75 mg=125 mg daily, Disp: , Rfl:    Azathioprine 75 MG TABS, Take 75 mg by mouth in the morning. 75 mg + 50 mg=125 mg daily, Disp: , Rfl:    carvedilol (COREG) 6.25 MG tablet, Take 6.25 mg by mouth in the morning and at bedtime., Disp: , Rfl:    insulin  glargine (LANTUS  SOLOSTAR) 100 UNIT/ML Solostar Pen, Inject 15 units sq nightly (Patient taking differently: Inject 20 Units into the skin 2 (two) times daily.), Disp: , Rfl:    insulin  lispro (HUMALOG  KWIKPEN) 100 UNIT/ML KwikPen, Per sliding scale through [facility] transplant team (Patient taking differently: Inject 0-10 Units into the skin in the morning, at noon, and at bedtime.), Disp: 15 mL, Rfl: 11   predniSONE  (DELTASONE ) 5 MG tablet, Take 5 mg by mouth in the morning., Disp: , Rfl:    sulfamethoxazole-trimethoprim (BACTRIM) 400-80 MG tablet, Take 1 tablet by mouth every Monday, Wednesday, and Friday. In the morning., Disp: , Rfl:    tacrolimus  (PROGRAF ) 1 MG capsule, Take 2 mg by mouth in the morning and at bedtime., Disp: , Rfl:    tirzepatide  (MOUNJARO ) 10 MG/0.5ML Pen, Inject 10 mg into the skin every Wednesday., Disp: , Rfl:    torsemide (DEMADEX) 20 MG tablet, Take 20 mg by mouth in the morning., Disp: , Rfl:    No Known Allergies   Review of Systems  Constitutional: Negative.   HENT: Negative.    Eyes: Negative.  Negative for blurred vision.  Respiratory: Negative.    Cardiovascular:  Positive for leg swelling.  Gastrointestinal: Negative.   Endocrine: Negative.  Negative for polydipsia, polyphagia and polyuria.   Genitourinary: Negative.   Musculoskeletal: Negative.   Skin: Negative.   Allergic/Immunologic: Negative.   Neurological: Negative.   Hematological: Negative.   Psychiatric/Behavioral: Negative.       Today's Vitals   12/21/23 1111  BP: 134/82  Pulse: 62  Temp: 98.1 F (36.7 C)  SpO2: 98%  Weight: 234 lb 12.8 oz (106.5 kg)  Height: 5' 8 (1.727 m)   Body mass index is 35.7 kg/m.  Wt Readings from Last 3 Encounters:  12/21/23 234 lb 12.8 oz (106.5 kg)  11/29/23 220 lb (99.8 kg)  11/24/23 229 lb 11.2 oz (104.2 kg)     Objective:  Physical Exam Vitals and nursing note reviewed.  Constitutional:      Appearance: Normal appearance. She is obese.  HENT:     Head: Normocephalic and atraumatic.     Right Ear: Tympanic membrane, ear canal and external ear normal.     Left Ear: Tympanic membrane, ear canal and external ear normal.     Nose: Nose normal.     Mouth/Throat:     Mouth: Mucous membranes are moist.  Pharynx: Oropharynx is clear.  Eyes:     Extraocular Movements: Extraocular movements intact.     Conjunctiva/sclera: Conjunctivae normal.     Pupils: Pupils are equal, round, and reactive to light.  Cardiovascular:     Rate and Rhythm: Normal rate and regular rhythm.     Pulses:          Dorsalis pedis pulses are 1+ on the right side and 1+ on the left side.     Heart sounds: Normal heart sounds.  Pulmonary:     Effort: Pulmonary effort is normal.     Breath sounds: Normal breath sounds.  Chest:  Breasts:    Tanner Score is 5.     Right: Normal.     Left: Normal.  Abdominal:     General: Bowel sounds are normal.     Palpations: Abdomen is soft.     Comments: Healed surgical scar RLQ Obese, soft, NABS  Genitourinary:    Comments: deferred Musculoskeletal:        General: Normal range of motion.     Cervical back: Normal range of motion and neck supple.     Right lower leg: Edema present.     Left lower leg: Edema present.  Feet:     Right foot:      Protective Sensation: 5 sites tested.  5 sites sensed.     Skin integrity: Dry skin present.     Toenail Condition: Right toenails are abnormally thick.     Left foot:     Protective Sensation: 5 sites tested.  5 sites sensed.     Skin integrity: Dry skin present.     Toenail Condition: Left toenails are abnormally thick.  Skin:    General: Skin is warm and dry.     Comments: Erythematous rash underneath both breasts No vesicular lesions noted  Neurological:     General: No focal deficit present.     Mental Status: She is alert and oriented to person, place, and time.  Psychiatric:        Mood and Affect: Mood normal.        Behavior: Behavior normal.         Assessment And Plan:  Encounter for general adult medical examination w/o abnormal findings Assessment & Plan: A full exam was performed.  Importance of monthly self breast exams was discussed with the patient.  She is advised to get 30-45 minutes of regular exercise, no less than four to five days per week. Both weight-bearing and aerobic exercises are recommended.  She is advised to follow a healthy diet with at least six fruits/veggies per day, decrease intake of red meat and other saturated fats and to increase fish intake to twice weekly.  Meats/fish should not be fried -- baked, boiled or broiled is preferable. It is also important to cut back on your sugar intake.  Be sure to read labels - try to avoid anything with added sugar, high fructose corn syrup or other sweeteners.  If you must use a sweetener, you can try stevia or monkfruit.  It is also important to avoid artificially sweetened foods/beverages and diet drinks. Lastly, wear SPF 50 sunscreen on exposed skin and when in direct sunlight for an extended period of time.  Be sure to avoid fast food restaurants and aim for at least 60 ounces of water  daily.       Type 2 diabetes mellitus with diabetic nephropathy, with long-term current use of insulin  [facility]) Assessment  &  Plan: Chronic, diabetic foot exam was performed.  Type 2 diabetes managed post-kidney transplant. Recent A1c 6.3. Adjusted Lantus  dosing due to hypoglycemia. Novolog  before meals to prevent hypoglycemia. - Continue Lantus : 15 units morning, 10 units night as needed per Endo - Continue Novolog  before meals. - Consider FMLA due to diabetes and transplant history. - Follow up in four to six months, continue w/ Endo  Orders: -     Lipid panel -     POCT URINALYSIS DIP (CLINITEK) -     Microalbumin / creatinine urine ratio -     EKG 12-Lead -     TSH  Hypertensive nephropathy Assessment & Plan: Chronic, fair control. Goal BP<120/80.  EKG performed, NSR w/ voltage criteria for LVH, inferior infarct. She will continue with carvedilol 6.25mg  twice daily per transplant team. She is encouraged to follow low sodium diet.    Peripheral edema Assessment & Plan: Edema with 14-pound weight gain and visible swelling. Discussed potential causes including schedule changes affecting diet and sleep. - Encourage use of compression hose. - Continue torsemide daily. - Increase daily activity   Class 2 severe obesity due to excess calories with serious comorbidity and body mass index (BMI) of 35.0 to 35.9 in adult [facility]) Assessment & Plan: She is encouraged to strive for BMI less than 30 to decrease cardiac risk. Advised to aim for at least 150 minutes of exercise per week.    Kidney transplanted Assessment & Plan: Transplant performed in Nov 2021, managed with meds as below and regular nephrology follow-ups. No complications reported. - Continue prednisone  as prescribed. - Continue Bactrim Monday, Wednesday, Friday. - Continue tacrolimus  2 mg twice a day. - Continue azathioprine 50 mg and 75 mg. - Order liver and kidney function tests.    Planned total hysterectomy with bilateral salpingo-oophorectomy Planned surgery due to benign findings with uncertain cervical areas. Decision to remove all  reproductive organs to prevent cancer risks. Discussed potential post-surgical estrogen patch. Addressed concerns about surgery extent and recovery time. - Proceed with planned total hysterectomy with bilateral salpingo-oophorectomy. - Consider post-surgical estrogen patch to manage symptoms. Return for 1 year physical, 6 month bp.  Patient was given opportunity to ask questions. Patient verbalized understanding of the plan and was able to repeat key elements of the plan. All questions were answered to their satisfaction.   I, Catheryn LOISE Slocumb, MD, have reviewed all documentation for this visit. The documentation on 12/21/23 for the exam, diagnosis, procedures, and orders are all accurate and complete.   IF YOU HAVE BEEN REFERRED TO A SPECIALIST, IT MAY TAKE 1-2 WEEKS TO SCHEDULE/PROCESS THE REFERRAL. IF YOU HAVE NOT HEARD FROM US /SPECIALIST IN TWO WEEKS, PLEASE GIVE US  A CALL AT [phone].   THE PATIENT IS ENCOURAGED TO PRACTICE SOCIAL DISTANCING DUE TO THE COVID-19 PANDEMIC.

## 2023-12-22 DIAGNOSIS — Z940 Kidney transplant status: Secondary | ICD-10-CM | POA: Diagnosis not present

## 2023-12-22 DIAGNOSIS — N2581 Secondary hyperparathyroidism of renal origin: Secondary | ICD-10-CM | POA: Diagnosis not present

## 2023-12-22 DIAGNOSIS — I129 Hypertensive chronic kidney disease with stage 1 through stage 4 chronic kidney disease, or unspecified chronic kidney disease: Secondary | ICD-10-CM | POA: Diagnosis not present

## 2023-12-22 DIAGNOSIS — Z79899 Other long term (current) drug therapy: Secondary | ICD-10-CM | POA: Diagnosis not present

## 2023-12-22 DIAGNOSIS — E8770 Fluid overload, unspecified: Secondary | ICD-10-CM | POA: Diagnosis not present

## 2023-12-22 DIAGNOSIS — E1122 Type 2 diabetes mellitus with diabetic chronic kidney disease: Secondary | ICD-10-CM | POA: Diagnosis not present

## 2023-12-22 DIAGNOSIS — N184 Chronic kidney disease, stage 4 (severe): Secondary | ICD-10-CM | POA: Diagnosis not present

## 2023-12-22 LAB — LIPID PANEL
Chol/HDL Ratio: 2.8 ratio (ref 0.0–4.4)
Cholesterol, Total: 144 mg/dL (ref 100–199)
HDL: 51 mg/dL (ref >39)
LDL Chol Calc (NIH): 76 mg/dL (ref 0–99)
Triglycerides: 91 mg/dL (ref 0–149)
VLDL Cholesterol Cal: 17 mg/dL (ref 5–40)

## 2023-12-22 LAB — MICROALBUMIN / CREATININE URINE RATIO
Creatinine, Urine: 93 mg/dL
Microalb/Creat Ratio: 27 mg/g{creat} (ref 0–29)
Microalbumin, Urine: 25 ug/mL

## 2023-12-22 LAB — TSH: TSH: 2.38 u[IU]/mL (ref 0.450–4.500)

## 2023-12-27 ENCOUNTER — Telehealth: Payer: Self-pay | Admitting: *Deleted

## 2023-12-27 NOTE — Telephone Encounter (Signed)
 LMOM for the patient to call the office back. Patient needs to be scheduled for a new patient appt.

## 2023-12-29 ENCOUNTER — Ambulatory Visit: Admitting: Internal Medicine

## 2023-12-29 ENCOUNTER — Telehealth: Payer: Self-pay | Admitting: *Deleted

## 2023-12-29 NOTE — Telephone Encounter (Signed)
 error

## 2023-12-29 NOTE — Telephone Encounter (Signed)
 LMOM for the patient to call the office back. Patient needs to be scheduled for a new patient appt.

## 2023-12-30 NOTE — Assessment & Plan Note (Addendum)
 Chronic, fair control. Goal BP<120/80.  EKG performed, NSR w/ voltage criteria for LVH, inferior infarct. She will continue with carvedilol 6.25mg  twice daily per transplant team. She is encouraged to follow low sodium diet.

## 2023-12-30 NOTE — Telephone Encounter (Signed)
 LMOM for the patient to call the office back. Patient needs to be scheduled for a new patient appt.

## 2023-12-30 NOTE — Telephone Encounter (Signed)
 Called referring office at [facility] and spoke with Arvin explained that our office has tried three attempts to reach the patient for a new patient appt

## 2023-12-30 NOTE — Assessment & Plan Note (Signed)

## 2023-12-31 DIAGNOSIS — R600 Localized edema: Secondary | ICD-10-CM | POA: Insufficient documentation

## 2023-12-31 NOTE — Assessment & Plan Note (Addendum)
 Edema with 14-pound weight gain and visible swelling. Discussed potential causes including schedule changes affecting diet and sleep. - Encourage use of compression hose. - Continue torsemide daily. - Increase daily activity

## 2023-12-31 NOTE — Assessment & Plan Note (Signed)
 Chronic, diabetic foot exam was performed.  Type 2 diabetes managed post-kidney transplant. Recent A1c 6.3. Adjusted Lantus  dosing due to hypoglycemia. Novolog  before meals to prevent hypoglycemia. - Continue Lantus : 15 units morning, 10 units night as needed per Endo - Continue Novolog  before meals. - Consider FMLA due to diabetes and transplant history. - Follow up in four to six months, continue w/ Endo

## 2023-12-31 NOTE — Assessment & Plan Note (Signed)
 She is encouraged to strive for BMI less than 30 to decrease cardiac risk. Advised to aim for at least 150 minutes of exercise per week.

## 2023-12-31 NOTE — Assessment & Plan Note (Addendum)
 Transplant performed in Nov 2021, managed with meds as below and regular nephrology follow-ups. No complications reported. - Continue prednisone  as prescribed. - Continue Bactrim Monday, Wednesday, Friday. - Continue tacrolimus  2 mg twice a day. - Continue azathioprine 50 mg and 75 mg. - Order liver and kidney function tests.

## 2024-01-04 ENCOUNTER — Telehealth: Payer: Self-pay | Admitting: Internal Medicine

## 2024-01-04 ENCOUNTER — Telehealth: Payer: Self-pay | Admitting: *Deleted

## 2024-01-04 NOTE — Telephone Encounter (Signed)
 Spoke with the patient regarding the referral to GYN oncology. Patient scheduled as new patient with Dr Eldonna on 9/8 at 9 am. Patient given an arrival time of 8:30 am.  Explained to the patient the the doctor will perform a pelvic exam at this visit. Patient given the policy that only one visitor allowed and that visitor must be over 16 yrs are allowed in the Cancer Center. Patient given the address/phone number for the clinic and that the center offers free valet service. Patient aware that masks required.

## 2024-01-04 NOTE — Telephone Encounter (Signed)
 Samantha [name] called in stating that she was referred to us  but we do not have a referral for her.

## 2024-01-04 NOTE — Telephone Encounter (Signed)
 I spoke with Samantha [name] whom stated that our OBGYN department has been tryig to reach her. I transferred her to that clinic to Michelle's line for scheduling of the GynOnc clinic.

## 2024-01-11 DIAGNOSIS — E1122 Type 2 diabetes mellitus with diabetic chronic kidney disease: Secondary | ICD-10-CM | POA: Diagnosis not present

## 2024-01-16 ENCOUNTER — Encounter: Payer: Self-pay | Admitting: Psychiatry

## 2024-01-16 ENCOUNTER — Inpatient Hospital Stay: Attending: Psychiatry | Admitting: Psychiatry

## 2024-01-16 VITALS — BP 125/59 | HR 71 | Temp 98.4°F | Resp 20 | Ht 67.0 in | Wt 234.0 lb

## 2024-01-16 DIAGNOSIS — Z79899 Other long term (current) drug therapy: Secondary | ICD-10-CM | POA: Diagnosis not present

## 2024-01-16 DIAGNOSIS — Z79631 Long term (current) use of antimetabolite agent: Secondary | ICD-10-CM | POA: Insufficient documentation

## 2024-01-16 DIAGNOSIS — Z79624 Long term (current) use of inhibitors of nucleotide synthesis: Secondary | ICD-10-CM | POA: Insufficient documentation

## 2024-01-16 DIAGNOSIS — Z7952 Long term (current) use of systemic steroids: Secondary | ICD-10-CM | POA: Insufficient documentation

## 2024-01-16 DIAGNOSIS — Z794 Long term (current) use of insulin: Secondary | ICD-10-CM | POA: Diagnosis not present

## 2024-01-16 DIAGNOSIS — N9089 Other specified noninflammatory disorders of vulva and perineum: Secondary | ICD-10-CM | POA: Insufficient documentation

## 2024-01-16 DIAGNOSIS — N185 Chronic kidney disease, stage 5: Secondary | ICD-10-CM | POA: Diagnosis not present

## 2024-01-16 DIAGNOSIS — D631 Anemia in chronic kidney disease: Secondary | ICD-10-CM | POA: Diagnosis not present

## 2024-01-16 DIAGNOSIS — I120 Hypertensive chronic kidney disease with stage 5 chronic kidney disease or end stage renal disease: Secondary | ICD-10-CM | POA: Insufficient documentation

## 2024-01-16 DIAGNOSIS — D84821 Immunodeficiency due to drugs: Secondary | ICD-10-CM | POA: Diagnosis not present

## 2024-01-16 DIAGNOSIS — B977 Papillomavirus as the cause of diseases classified elsewhere: Secondary | ICD-10-CM | POA: Diagnosis not present

## 2024-01-16 DIAGNOSIS — Z6836 Body mass index (BMI) 36.0-36.9, adult: Secondary | ICD-10-CM | POA: Diagnosis not present

## 2024-01-16 DIAGNOSIS — Z940 Kidney transplant status: Secondary | ICD-10-CM | POA: Diagnosis not present

## 2024-01-16 DIAGNOSIS — N871 Moderate cervical dysplasia: Secondary | ICD-10-CM | POA: Insufficient documentation

## 2024-01-16 DIAGNOSIS — D071 Carcinoma in situ of vulva: Secondary | ICD-10-CM | POA: Insufficient documentation

## 2024-01-16 DIAGNOSIS — Z7985 Long-term (current) use of injectable non-insulin antidiabetic drugs: Secondary | ICD-10-CM | POA: Diagnosis not present

## 2024-01-16 NOTE — Progress Notes (Unsigned)
 GYNECOLOGIC ONCOLOGY NEW PATIENT CONSULTATION  Date of Service: 01/16/2024 Referring Provider: Rubie Husky, MD   ASSESSMENT AND PLAN: Samantha [name] is a 58 y.o. woman with a history of persistent CIN-2, status post LEEP and cold knife conization, medically complicated by history of kidney transplant.  Incidentally additionally found on exam to have vulvar changes concerning for vulvar dysplasia.  Cervical dysplasia: Reviewed her Pap smear, colposcopy, and excisional procedure history in detail. Reviewed that surgical intervention with hysterectomy will not treat HPV virus.   Reviewed her risk factors for ongoing HPV infection and dysplasia to include immunosuppression and the history of her kidney transplant. Options for management for persistent CIN2 include continued close observation with Pap smear and colposcopy in 6 months given her recent conization with positive margins based on the ECC. Additionally, hysterectomy for persistent CIN-2 is reasonable.  She has increased risk of surgical complication given her history of kidney transplant.  However, on exam, given her 2 prior excisional procedures, she has little residual cervix which may make ongoing monitoring and additional procedures quite challenging.  Given this, feel that proceeding with hysterectomy may be reasonable. Did review that hysterectomy does not take away her risk of ongoing vaginal dysplasia.  Will continue to need ongoing Pap smears and monitoring in the future and will likely continue to require frequent colposcopies if she continues to have positive HPV. Discussed proceeding with MRI of the pelvis for surgical planning. Patient will return after MRI to finalize surgical plan.  May consider surgery in [facility] in order to coordinate with transplant surgeons as needed. 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 Given immunosuppression and history of HPV virus positive, and in the setting of these apparent changes, recommended colposcopy today and vulvar biopsy.  See procedure note below. We reviewed the nature of vulvar dysplasia and its relationship to HPV virus. A representative biopsy was taken on the right and the left. Will follow-up biopsy results and review potential treatment pending these results at time of patient follow-up.   A copy of this note was sent to the patient's referring provider.  [name], MD Gynecologic Oncology   Medical Decision Making I personally spent  TOTAL 60 minutes face-to-face and non-face-to-face in the care of this patient, which includes all pre, intra, and post visit time on the date of service.   ------------  CC: Cervical dysplasia  HISTORY OF PRESENT ILLNESS:  Samantha [name] is a 58 y.o. woman who is seen in consultation at the request of Rubie Husky, MD for evaluation of CIN2.  Patient has a history of abnormal Pap smears as follows: 03/02/2016: Pap NILM 06/10/2017: Pap ASCUS H, HPV negative 07/13/2017: Colpo visually normal, ECC benign 02/08/2019: Pap NILM, HR HPV positive 03/08/2019: Colpo visually normal, ECC benign 01/04/2022: Pap ASCUS, HPV 16 positive 01/26/2022: Colpo visually normal, ECC benign 02/15/2023: Pap L SIL, HPV 16 positive 03/25/2023: Colpo biopsy CIN-1, ECC CIN-2 09/02/23: LEEP residual CIN-2, positive margin at the endocervix 11/29/2023: CKC benign, LSIL/CIN-1 with focal area concerning for H SIL/CIN-2 on ECC  Patient's medical history is complicated by a history of chronic kidney disease, now status post kidney transplant on immunosuppression.  She was seen by OB/GYN on 12/22/2023 for discussion of possible hysterectomy.  Given her pelvic kidney, she was referred to gynecology for surgical complexity.  Today patient endorses  a history as above.  In  terms of her transplant history, she reports that her transplant nephrology team is at [facility].  She otherwise follows with Dr. Bernardino Gasman with [facility] locally in [place].  She underwent a cardiac catheterization in 2017.  She reports that this may have been a part of her workup for her kidney transplant.  She denies any chest pain or shortness of breath.  In terms of her diabetes her most recent A1c was on 12/15/2023 and was 6.3.  She denies any vulvar itching today.    PAST MEDICAL HISTORY: Past Medical History:  Diagnosis Date   Anemia in chronic kidney disease (CKD)    Chronic kidney disease, stage V (HCC)    s/p kidney transplant 03/28/2020   Hypertension    Kidney transplanted    Uncontrolled diabetes mellitus with microalbuminuria or microproteinuria     PAST SURGICAL HISTORY: Past Surgical History:  Procedure Laterality Date   CARDIAC CATHETERIZATION N/A 03/23/2016   Procedure: Left Heart Cath and Coronary Angiography;  Surgeon: Gordy Bergamo, MD;  Location: [facility] INVASIVE CV LAB;  Service: Cardiovascular;  Laterality: N/A;   CATARACT EXTRACTION W/ INTRAOCULAR LENS IMPLANT Bilateral    CERVICAL CONIZATION W/BX N/A 11/29/2023   Procedure: CONE BIOPSY, CERVIX;  Surgeon: Lilton Legions, DO;  Location: [facility] OR;  Service: Gynecology;  Laterality: N/A;   CESAREAN SECTION  2005   CESAREAN SECTION  1993   DILITATION & CURRETTAGE/HYSTROSCOPY WITH NOVASURE ABLATION N/A 10/24/2014   Procedure: DILATATION & CURETTAGE/HYSTEROSCOPY WITH NOVASURE ABLATION;  Surgeon: Gigi Botts, MD;  Location: [facility] ORS;  Service: Gynecology;  Laterality: N/A;   KIDNEY TRANSPLANT  03/28/2020   [facility]   LEEP N/A 09/02/2023   Procedure: LEEP (LOOP ELECTROSURGICAL EXCISION PROCEDURE);  Surgeon: Lilton Legions, DO;  Location: [facility] OR;  Service: Gynecology;  Laterality: N/A;   THYROIDECTOMY  2009   partial   TUBAL LIGATION  2005    OB/GYN HISTORY: OB History  Gravida Para Term Preterm  AB Living  3 2 1 1 1    SAB IAB Ectopic Multiple Live Births          # Outcome Date GA Lbr Len/2nd Weight Sex Type Anes PTL Lv  3 AB           2 Preterm      CS-Unspec     1 Term      CS-Unspec         Age at menarche: 4 Age at menopause: 6 Hx of HRT: OCPS in early 7s Hx of STI: no Last pap: see above History of abnormal pap smears: see above  SCREENING STUDIES:  Last mammogram: 2024 Last colonoscopy: 2023  MEDICATIONS:  Current Outpatient Medications:    atorvastatin  (LIPITOR) 80 MG tablet, Take 80 mg by mouth every evening., Disp: , Rfl:    azaTHIOprine (IMURAN) 50 MG tablet, Take 50 mg by mouth in the morning. 50 mg + 75 mg=125 mg daily, Disp: , Rfl:    Azathioprine 75 MG TABS, Take 75 mg by mouth in the morning. 75 mg + 50 mg=125 mg daily, Disp: , Rfl:    carvedilol (COREG) 6.25 MG tablet, Take 6.25 mg by mouth in the morning and at bedtime., Disp: , Rfl:    Continuous Glucose Receiver (DEXCOM G7 RECEIVER) DEVI, , Disp: , Rfl:    insulin  glargine (LANTUS  SOLOSTAR) 100 UNIT/ML Solostar Pen, Inject 15 units sq nightly (Patient taking differently: Inject 20 Units into the skin 2 (  two) times daily.), Disp: , Rfl:    insulin  lispro (HUMALOG  KWIKPEN) 100 UNIT/ML KwikPen, Per sliding scale through [facility] transplant team (Patient taking differently: Inject 0-10 Units into the skin in the morning, at noon, and at bedtime.), Disp: 15 mL, Rfl: 11   predniSONE  (DELTASONE ) 5 MG tablet, Take 5 mg by mouth in the morning., Disp: , Rfl:    sulfamethoxazole-trimethoprim (BACTRIM) 400-80 MG tablet, Take 1 tablet by mouth every Monday, Wednesday, and Friday. In the morning., Disp: , Rfl:    tacrolimus  (PROGRAF ) 1 MG capsule, Take 2 mg by mouth in the morning and at bedtime., Disp: , Rfl:    tirzepatide  (MOUNJARO ) 10 MG/0.5ML Pen, Inject 10 mg into the skin every Wednesday., Disp: , Rfl:    torsemide (DEMADEX) 20 MG tablet, Take 20 mg by mouth in the morning., Disp: , Rfl:    ALLERGIES: No Known Allergies  FAMILY HISTORY: Family History  Problem Relation Age of Onset   Hypertension Mother    Diabetes Mother    Kidney disease Mother    Crohn's disease Mother    Diabetes Father    Hypertension Father    Ulcers Father    Kidney disease Father    Breast cancer Neg Hx    Ovarian cancer Neg Hx    Endometrial cancer Neg Hx    Colon cancer Neg Hx     SOCIAL HISTORY: Social History   Socioeconomic History   Marital status: Divorced    Spouse name: Not on file   Number of children: Not on file   Years of education: Not on file   Highest education level: Not on file  Occupational History   Not on file  Tobacco Use   Smoking status: Never   Smokeless tobacco: Never  Vaping Use   Vaping status: Never Used  Substance and Sexual Activity   Alcohol use: No   Drug use: No   Sexual activity: Not Currently    Birth control/protection: Other-see comments    Comment: Ablation  Other Topics Concern   Not on file  Social History Narrative   Not on file   Social Drivers of Health   Financial Resource Strain: Low Risk  (04/14/2023)   Overall Financial Resource Strain (CARDIA)    Difficulty of Paying Living Expenses: Not hard at all  Food Insecurity: Low Risk  (08/01/2023)   Received from [facility]   Hunger Vital Sign    Within the past 12 months, you worried that your food would run out before you got money to buy more: Never true    Within the past 12 months, the food you bought just didn't last and you didn't have money to get more. : Never true  Transportation Needs: No Transportation Needs (08/01/2023)   Received from [facility]    In the past 12 months, has lack of reliable transportation kept you from medical appointments, meetings, work or from getting things needed for daily living? : No  Physical Activity: Inactive (04/14/2023)   Exercise Vital Sign    Days of Exercise per Week: 0 days    Minutes of Exercise per  Session: 0 min  Stress: No Stress Concern Present (04/14/2023)   [employer] of Occupational Health - Occupational Stress Questionnaire    Feeling of Stress : Not at all  Social Connections: Moderately Isolated (04/14/2023)   Social Connection and Isolation Panel    Frequency of Communication with Friends and Family: More than three  times a week    Frequency of Social Gatherings with Friends and Family: Once a week    Attends Religious Services: Never    Database administrator or Organizations: Yes    Attends Banker Meetings: 1 to 4 times per year    Marital Status: Divorced  Intimate Partner Violence: Not At Risk (04/14/2023)   Humiliation, Afraid, Rape, and Kick questionnaire    Fear of Current or Ex-Partner: No    Emotionally Abused: No    Physically Abused: No    Sexually Abused: No    REVIEW OF SYSTEMS: New patient intake form was reviewed.  Complete 10-system review is negative except for the following: leg swelling  PHYSICAL EXAM: BP (!) 125/59 (BP Location: Left Arm, Patient Position: Sitting)   Pulse 71   Temp 98.4 F (36.9 C) (Oral)   Resp 20   Ht 5' 7 (1.702 m)   Wt 234 lb (106.1 kg)   LMP  (LMP Unknown)   SpO2 99%   BMI 36.65 kg/m  Constitutional: No acute distress. Neuro/Psych: Alert, oriented.  Head and Neck: Normocephalic, atraumatic. Neck symmetric without masses. Sclera anicteric. Scar on neck from partial thyroidectomy Respiratory: Normal work of breathing. Clear to auscultation bilaterally. Cardiovascular: Regular rate and rhythm, no murmurs, rubs, or gallops. Abdomen: Normoactive bowel sounds. Soft, non-distended, non-tender to palpation. Well healed RLQ incisions Extremities: Grossly normal range of motion. Warm, well perfused. 1+ pitting edema bilaterall Skin: No rashes or lesions. Lymphatic: No cervical, supraclavicular, or inguinal adenopathy. Genitourinary: External genitalia with multifocal verrucous lesions bilaterall. Urethral  meatus without lesions or prolapse. On speculum exam, vagina withotu lesions. Cervix difficult to visualize due to little residual cervix. Bimanual exam reveals small residual cervix at apex, nearly flush. Exam chaperoned by Eleanor Epps, NP   VULVAR COLPOSCOPY PROCEDURE NOTE  Procedure Details: After appropriate verbal informed consent was obtained, a timeout was performed. Acetic acid  was applied to the vulva and the vulva was inspected with the colposcope with the findings as noted below. The vulva was then cleansed of the acetic acid  with water . The patient tolerated the procedure well.   Adequate Exam: yes  Biopsy Specimen: left labia majora, right labia minora  Condition: Stable. Patient tolerated procedure well.  Complications: None  Findings: multifocal areas on bilateral labia minor and majora with verrucous appearance. With acetic acid , multifocal acetowhite changes, most dominant of the anterior right labia minora and on the left labia majora adjacent to the junction with the labia minora   Colposcopic Impression: [id number]   LABORATORY AND RADIOLOGIC DATA: Outside medical records were reviewed to synthesize the above history, along with the history and physical obtained during the visit.  Outside laboratory, pathology, and imaging reports were reviewed, with pertinent results below.  I personally reviewed the outside images.  WBC  Date Value Ref Range Status  11/24/2023 8.4 4.0 - 10.5 K/uL Final   Hemoglobin  Date Value Ref Range Status  11/24/2023 12.2 12.0 - 15.0 g/dL Final  96/90/7978 8.2 (L) 11.1 - 15.9 g/dL Final   HCT  Date Value Ref Range Status  11/24/2023 39.3 36.0 - 46.0 % Final   Hematocrit  Date Value Ref Range Status  07/17/2019 26.1 (L) 34.0 - 46.6 % Final   Platelets  Date Value Ref Range Status  11/24/2023 282 150 - 400 K/uL Final  07/17/2019 375 150 - 450 x10E3/uL Final   Magnesium  Date Value Ref Range Status  06/20/2021 1.9 1.7 - 2.4 mg/dL  Final    Comment:    Performed at [facility], [address], [place], [place] [address]   Creatinine, Ser  Date Value Ref Range Status  11/24/2023 3.08 (H) 0.44 - 1.00 mg/dL Final   AST  Date Value Ref Range Status  08/29/2023 16 0 - 40 IU/L Final   ALT  Date Value Ref Range Status  08/29/2023 16 0 - 32 IU/L Final   CHL HPV  Date Value Ref Range Status  04/15/2023 Positive  Final    Surgical pathology (11/29/23): A. ENDOCERVIX, CURETTAGE:  - Low-grade squamous intraepithelial lesion (L SIL/CIN-1) with focal  areas concerning for high-grade squamous intraepithelial lesion (H  SIL/CIN-2).   B. CERVIX, DISTAL PORTION, CONE:  - Benign ectocervical mucosa  - No dysplasia or malignancy identified   C. CERVIX, PROXIMAL PORTION, CONE:  - Benign endocervical stroma  - No ectocervical mucosa identified   D. CERVIX, ENDOCERVICAL CANAL, CONE:  - Benign endocervical stroma  - No ectocervical mucosa identified

## 2024-01-16 NOTE — Patient Instructions (Signed)
 1) Today you have 2 biopsies taken of the vulva. You can use the peri bottle to rinse after toileting and pat the vulva dry. You may have some light spotting/bleeding or see a grayish discharge from the medication used to stop bleeding. Please call for heavy bleeding.  2) Plan on having a pelvic MRI.   3) We will see you back in the office after the MRI to discuss the plan moving forward.  4) Please call for any questions or concerns.

## 2024-01-17 ENCOUNTER — Encounter: Payer: Self-pay | Admitting: Psychiatry

## 2024-01-18 LAB — SURGICAL PATHOLOGY

## 2024-01-23 ENCOUNTER — Ambulatory Visit (HOSPITAL_COMMUNITY)
Admission: RE | Admit: 2024-01-23 | Discharge: 2024-01-23 | Disposition: A | Discharge status: Home / Self Care | Source: Ambulatory Visit | Attending: Psychiatry | Admitting: Psychiatry

## 2024-01-23 ENCOUNTER — Inpatient Hospital Stay (HOSPITAL_COMMUNITY): Admission: RE | Admit: 2024-01-23 | Source: Ambulatory Visit

## 2024-01-23 DIAGNOSIS — Z940 Kidney transplant status: Secondary | ICD-10-CM | POA: Diagnosis not present

## 2024-01-23 DIAGNOSIS — N879 Dysplasia of cervix uteri, unspecified: Secondary | ICD-10-CM | POA: Diagnosis not present

## 2024-01-23 DIAGNOSIS — D259 Leiomyoma of uterus, unspecified: Secondary | ICD-10-CM | POA: Diagnosis not present

## 2024-01-23 DIAGNOSIS — N871 Moderate cervical dysplasia: Secondary | ICD-10-CM | POA: Diagnosis not present

## 2024-01-23 DIAGNOSIS — N281 Cyst of kidney, acquired: Secondary | ICD-10-CM | POA: Diagnosis not present

## 2024-01-23 MED ORDER — GADOBUTROL 1 MMOL/ML IV SOLN
10.0000 mL | Freq: Once | INTRAVENOUS | Status: AC | PRN
  Administered 2024-01-23: 10 mL via INTRAVENOUS

## 2024-01-24 ENCOUNTER — Ambulatory Visit: Payer: Self-pay | Admitting: Psychiatry

## 2024-01-25 DIAGNOSIS — H31012 Macula scars of posterior pole (postinflammatory) (post-traumatic), left eye: Secondary | ICD-10-CM | POA: Diagnosis not present

## 2024-01-25 DIAGNOSIS — E113593 Type 2 diabetes mellitus with proliferative diabetic retinopathy without macular edema, bilateral: Secondary | ICD-10-CM | POA: Diagnosis not present

## 2024-01-25 DIAGNOSIS — H43822 Vitreomacular adhesion, left eye: Secondary | ICD-10-CM | POA: Diagnosis not present

## 2024-01-25 DIAGNOSIS — H3582 Retinal ischemia: Secondary | ICD-10-CM | POA: Diagnosis not present

## 2024-01-25 DIAGNOSIS — H4321 Crystalline deposits in vitreous body, right eye: Secondary | ICD-10-CM | POA: Diagnosis not present

## 2024-01-25 DIAGNOSIS — H34231 Retinal artery branch occlusion, right eye: Secondary | ICD-10-CM | POA: Diagnosis not present

## 2024-01-25 LAB — HM DIABETES EYE EXAM

## 2024-01-26 ENCOUNTER — Other Ambulatory Visit: Payer: Self-pay | Admitting: Internal Medicine

## 2024-01-30 ENCOUNTER — Encounter: Payer: Self-pay | Admitting: Psychiatry

## 2024-01-30 ENCOUNTER — Inpatient Hospital Stay (HOSPITAL_BASED_OUTPATIENT_CLINIC_OR_DEPARTMENT_OTHER): Admitting: Psychiatry

## 2024-01-30 VITALS — BP 138/74 | HR 64 | Temp 98.3°F | Resp 18 | Wt 235.2 lb

## 2024-01-30 DIAGNOSIS — D071 Carcinoma in situ of vulva: Secondary | ICD-10-CM | POA: Diagnosis not present

## 2024-01-30 DIAGNOSIS — Z7189 Other specified counseling: Secondary | ICD-10-CM | POA: Diagnosis not present

## 2024-01-30 DIAGNOSIS — N9089 Other specified noninflammatory disorders of vulva and perineum: Secondary | ICD-10-CM | POA: Diagnosis not present

## 2024-01-30 DIAGNOSIS — I120 Hypertensive chronic kidney disease with stage 5 chronic kidney disease or end stage renal disease: Secondary | ICD-10-CM | POA: Diagnosis not present

## 2024-01-30 DIAGNOSIS — N185 Chronic kidney disease, stage 5: Secondary | ICD-10-CM | POA: Diagnosis not present

## 2024-01-30 DIAGNOSIS — B977 Papillomavirus as the cause of diseases classified elsewhere: Secondary | ICD-10-CM | POA: Diagnosis not present

## 2024-01-30 DIAGNOSIS — N871 Moderate cervical dysplasia: Secondary | ICD-10-CM | POA: Diagnosis not present

## 2024-01-30 DIAGNOSIS — D631 Anemia in chronic kidney disease: Secondary | ICD-10-CM | POA: Diagnosis not present

## 2024-01-30 DIAGNOSIS — Z940 Kidney transplant status: Secondary | ICD-10-CM

## 2024-01-30 DIAGNOSIS — D84821 Immunodeficiency due to drugs: Secondary | ICD-10-CM | POA: Diagnosis not present

## 2024-01-30 MED ORDER — LIDOCAINE 5 % EX OINT: 1.0000 | TOPICAL_OINTMENT | CUTANEOUS | 1 refills | Status: DC | PRN

## 2024-01-30 MED ORDER — IMIQUIMOD 5 % EX CREA: TOPICAL_CREAM | CUTANEOUS | 3 refills | Status: AC

## 2024-01-30 NOTE — Patient Instructions (Signed)
 It was a pleasure to see you in clinic today. - We discussed starting aldara  cream for your vulva. - Return visit planned for 8 weeks to monitor progress of vulva and preop for surgery  Thank you very much for allowing me to provide care for you today.  I appreciate your confidence in choosing our Gynecologic Oncology team at [facility].  If you have any questions about your visit today please call our office or send us  a MyChart message and we will get back to you as soon as possible.

## 2024-01-30 NOTE — Progress Notes (Signed)
 Gynecologic Oncology Return Clinic Visit  Date of Service: 01/30/2024 Referring Provider: Rubie Husky, MD   Assessment & Plan: Samantha [name] is a 58 y.o. 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  Cervical dysplasia: - Pt wishes to proceed with hysterectomy for persistent CIN2 despite 2 excisional procedures. 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 -Will finalize surgery plan at next visit.   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 - Biopsy results returned with VIN 3. - Based on exam, extensive disease bilaterally. - We reviewed the nature of vulvar dysplasia. Surgical excision is the mainstay of treatment, but ablative therapy or pharmacologic treatment is an option for some patients in certain clinical scenarios. The goals of treatment of vulvar dysplasia are to prevent development of vulvar squamous carcinoma and relieve any associated symptoms, such as pain or itching. The goal is also preserve vulvar anatomy as best as possible. - Given her extent of disease, surgery would be extensive and radical.  Additionally, laser would require extensive treatment.  Given this, recommend initial topical treatment with Aldara . - Side effects of treatment and instructions for application reviewed. - Recommend treatment for 16 weeks.  Will plan to have patient follow-up in 8 weeks for repeat exam to monitor for progress.  RTC 8 weeks.  [name], MD Gynecologic Oncology   Medical Decision Making I personally spent  TOTAL 30 minutes face-to-face and non-face-to-face in the care of this patient, which includes all pre, intra,  and post visit time on the date of service.   ----------------------- Reason for Visit: Follow-up, treatment discussion  Treatment History: Patient has a history of abnormal Pap smears as follows: 03/02/2016: Pap NILM 06/10/2017: Pap ASCUS H, HPV negative 07/13/2017: Colpo visually normal, ECC benign 02/08/2019: Pap NILM, HR HPV positive 03/08/2019: Colpo visually normal, ECC benign 01/04/2022: Pap ASCUS, HPV 16 positive 01/26/2022: Colpo visually normal, ECC benign 02/15/2023: Pap L SIL, HPV 16 positive 03/25/2023: Colpo biopsy CIN-1, ECC CIN-2 09/02/23: LEEP residual CIN-2, positive margin at the endocervix 11/29/2023: CKC benign, LSIL/CIN-1 with focal area concerning for H SIL/CIN-2 on ECC   Patient's medical history is complicated by a history of chronic kidney disease, now status post kidney transplant on immunosuppression.  She was seen by OB/GYN on 12/22/2023 for discussion of possible hysterectomy.  Given her pelvic kidney, she was referred to gynecology for surgical complexity.   In terms of her transplant history, she reports that her transplant nephrology team is at [facility].  She otherwise follows with Dr. Bernardino Gasman with [facility] in [place].  She underwent a cardiac catheterization in 2017.  She reports that this may have been a part of her workup for her kidney transplant.  She denies any chest pain or shortness of breath.  In terms of her diabetes her most recent A1c was on 12/15/2023 and was 6.3.  She denies any vulvar itching today.  Interval History: No changes since last visit. No issues from biopsy sites.  Underwent pelvic MRI.   Past Medical/Surgical History: Past Medical History:  Diagnosis Date   Anemia in chronic kidney disease (CKD)    Chronic kidney disease, stage V (HCC)    s/p kidney transplant  03/28/2020   Hypertension    Kidney transplanted    Uncontrolled diabetes mellitus with microalbuminuria or microproteinuria     Past  Surgical History:  Procedure Laterality Date   CARDIAC CATHETERIZATION N/A 03/23/2016   Procedure: Left Heart Cath and Coronary Angiography;  Surgeon: Gordy Bergamo, MD;  Location: [facility] INVASIVE CV LAB;  Service: Cardiovascular;  Laterality: N/A;   CATARACT EXTRACTION W/ INTRAOCULAR LENS IMPLANT Bilateral    CERVICAL CONIZATION W/BX N/A 11/29/2023   Procedure: CONE BIOPSY, CERVIX;  Surgeon: Lilton Legions, DO;  Location: [facility] OR;  Service: Gynecology;  Laterality: N/A;   CESAREAN SECTION  2005   CESAREAN SECTION  1993   DILITATION & CURRETTAGE/HYSTROSCOPY WITH NOVASURE ABLATION N/A 10/24/2014   Procedure: DILATATION & CURETTAGE/HYSTEROSCOPY WITH NOVASURE ABLATION;  Surgeon: Gigi Botts, MD;  Location: [facility] ORS;  Service: Gynecology;  Laterality: N/A;   KIDNEY TRANSPLANT  03/28/2020   [facility]   LEEP N/A 09/02/2023   Procedure: LEEP (LOOP ELECTROSURGICAL EXCISION PROCEDURE);  Surgeon: Lilton Legions, DO;  Location: [facility] OR;  Service: Gynecology;  Laterality: N/A;   THYROIDECTOMY  2009   partial   TUBAL LIGATION  2005    Family History  Problem Relation Age of Onset   Hypertension Mother    Diabetes Mother    Kidney disease Mother    Crohn's disease Mother    Diabetes Father    Hypertension Father    Ulcers Father    Kidney disease Father    Breast cancer Neg Hx    Ovarian cancer Neg Hx    Endometrial cancer Neg Hx    Colon cancer Neg Hx     Social History   Socioeconomic History   Marital status: Divorced    Spouse name: Not on file   Number of children: Not on file   Years of education: Not on file   Highest education level: Not on file  Occupational History   Not on file  Tobacco Use   Smoking status: Never   Smokeless tobacco: Never  Vaping Use   Vaping status: Never Used  Substance and Sexual Activity   Alcohol use: No   Drug use: No   Sexual activity: Not Currently    Birth control/protection: Other-see comments    Comment: Ablation  Other Topics Concern    Not on file  Social History Narrative   Not on file   Social Drivers of Health   Financial Resource Strain: Low Risk  (04/14/2023)   Overall Financial Resource Strain (CARDIA)    Difficulty of Paying Living Expenses: Not hard at all  Food Insecurity: Low Risk  (08/01/2023)   Received from [facility]   Hunger Vital Sign    Within the past 12 months, you worried that your food would run out before you got money to buy more: Never true    Within the past 12 months, the food you bought just didn't last and you didn't have money to get more. : Never true  Transportation Needs: No Transportation Needs (08/01/2023)   Received from [employer]    In the past 12 months, has lack of reliable transportation kept you from medical appointments, meetings, work or from getting things needed for daily living? : No  Physical Activity: Inactive (04/14/2023)   Exercise Vital Sign    Days of Exercise per Week: 0 days    Minutes of Exercise per Session: 0 min  Stress: No Stress Concern Present (04/14/2023)   [employer] of  Occupational Health - Occupational Stress Questionnaire    Feeling of Stress : Not at all  Social Connections: Moderately Isolated (04/14/2023)   Social Connection and Isolation Panel    Frequency of Communication with Friends and Family: More than three times a week    Frequency of Social Gatherings with Friends and Family: Once a week    Attends Religious Services: Never    Database administrator or Organizations: Yes    Attends Engineer, structural: 1 to 4 times per year    Marital Status: Divorced    Current Medications:  Current Outpatient Medications:    imiquimod  (ALDARA ) 5 % cream, Apply topically 3 (three) times a week., Disp: 48 each, Rfl: 3   lidocaine  (XYLOCAINE ) 5 % ointment, Apply 1 Application topically as needed., Disp: 30 g, Rfl: 1   atorvastatin  (LIPITOR) 80 MG tablet, Take 80 mg by mouth every evening., Disp: , Rfl:     azaTHIOprine (IMURAN) 50 MG tablet, Take 50 mg by mouth in the morning. 50 mg + 75 mg=125 mg daily, Disp: , Rfl:    Azathioprine 75 MG TABS, Take 75 mg by mouth in the morning. 75 mg + 50 mg=125 mg daily, Disp: , Rfl:    carvedilol (COREG) 6.25 MG tablet, Take 6.25 mg by mouth in the morning and at bedtime., Disp: , Rfl:    Continuous Glucose Receiver (DEXCOM G7 RECEIVER) DEVI, , Disp: , Rfl:    insulin  glargine (LANTUS  SOLOSTAR) 100 UNIT/ML Solostar Pen, Inject 15 units sq nightly (Patient taking differently: Inject 20 Units into the skin 2 (two) times daily.), Disp: , Rfl:    insulin  lispro (HUMALOG  KWIKPEN) 100 UNIT/ML KwikPen, Per sliding scale through [facility] transplant team (Patient taking differently: Inject 0-10 Units into the skin in the morning, at noon, and at bedtime.), Disp: 15 mL, Rfl: 11   predniSONE  (DELTASONE ) 5 MG tablet, Take 5 mg by mouth in the morning., Disp: , Rfl:    sulfamethoxazole-trimethoprim (BACTRIM) 400-80 MG tablet, Take 1 tablet by mouth every Monday, Wednesday, and Friday. In the morning., Disp: , Rfl:    tacrolimus  (PROGRAF ) 1 MG capsule, Take 2 mg by mouth in the morning and at bedtime., Disp: , Rfl:    tirzepatide  (MOUNJARO ) 10 MG/0.5ML Pen, Inject 10 mg into the skin every Wednesday., Disp: , Rfl:    torsemide (DEMADEX) 20 MG tablet, Take 20 mg by mouth in the morning., Disp: , Rfl:   Review of Symptoms: Complete 10-system review is positive for: Dizziness, Leg swelling  Physical Exam: BP 138/74 (BP Location: Left Arm, Patient Position: Sitting)   Pulse 64   Temp 98.3 F (36.8 C) (Oral)   Resp 18   Wt 235 lb 3.2 oz (106.7 kg)   LMP  (LMP Unknown)   SpO2 98%   BMI 36.84 kg/m  General: Alert, oriented, no acute distress. HEENT: Normocephalic, atraumatic.  Chest: Normal work of breathing.  Extremities: Grossly normal range of motion.  Warm, well perfused.  1+ pitting edema equal bilaterally Skin: No rashes or lesions noted. GU: External genitalia  with multifocal verrucous lesions bilaterally.  Stable from prior.  Well-healed biopsy sites.. Exam chaperoned by Eleanor Epps, NP    Laboratory & Radiologic Studies: Surgical pathology (01/16/24): A. LEFT LABIA MINORA,  BIOPSY:  High-grade vulvar intraepithelial neoplasia, VIN-3 with condylomatous  features.  VIN-3 involves the edges of the biopsy.  Negative for invasive carcinoma.   B. RIGHT LABIA MINORA, BIOPSY:  High-grade vulvar intraepithelial  neoplasia, VIN-3 with condylomatous  features.  VIN-3 involves the edges of the biopsy.  Negative for invasive carcinoma.   MR Pelvis W Wo Contrast 01/23/2024  Narrative CLINICAL DATA:  Adnexal mass. Cervical dysplasia. Renal transplant patient.  EXAM: MRI PELVIS WITHOUT AND WITH CONTRAST  TECHNIQUE: Multiplanar multisequence MR imaging of the pelvis was performed both before and after administration of intravenous contrast.  CONTRAST:  10mL GADAVIST  GADOBUTROL  1 MMOL/ML IV SOLN  COMPARISON:  Noncontrast CT on 06/19/2021  FINDINGS: Lower Urinary Tract: No urinary bladder or urethral abnormality identified.  Bowel: Unremarkable pelvic bowel loops.  Vascular/Lymphatic: Unremarkable. No pathologically enlarged pelvic lymph nodes identified.  Reproductive:  -- Uterus: Measures 9.1 x 4.0 x 4.7 cm. Several small uterine fibroids are seen, largest measuring 1.6 cm. No endometrial lesions identified. Prior C-section scar noted. Cervix and vagina are normal in appearance.  -- Right ovary: Normal postmenopausal appearance. No ovarian or adnexal masses identified.  -- Left ovary: Normal postmenopausal appearance. No ovarian or adnexal masses identified.  Other: No peritoneal thickening or abnormal free fluid. Renal transplant seen in the right iliac fossa. Two small benign-appearing renal cysts are seen. No evidence of mass or hydronephrosis.  Musculoskeletal:  Unremarkable.  IMPRESSION: Several small uterine fibroids,  largest measuring 1.6 cm. No evidence of cervical mass.  Normal postmenopausal appearance of both ovaries. No adnexal mass identified.  Renal transplant in the right iliac fossa.   Electronically Signed By: Norleen DELENA Kil M.D. On: 01/24/2024 12:59

## 2024-02-01 DIAGNOSIS — Z940 Kidney transplant status: Secondary | ICD-10-CM | POA: Diagnosis not present

## 2024-02-01 DIAGNOSIS — E8770 Fluid overload, unspecified: Secondary | ICD-10-CM | POA: Diagnosis not present

## 2024-02-04 ENCOUNTER — Ambulatory Visit: Payer: Self-pay | Admitting: Internal Medicine

## 2024-02-06 ENCOUNTER — Other Ambulatory Visit: Payer: Self-pay | Admitting: Internal Medicine

## 2024-02-06 DIAGNOSIS — H349 Unspecified retinal vascular occlusion: Secondary | ICD-10-CM

## 2024-02-07 ENCOUNTER — Telehealth: Payer: Self-pay | Admitting: *Deleted

## 2024-02-07 NOTE — Telephone Encounter (Signed)
 Per Dr Eldonna fax surgical optimization form and records to the patient's PCP office Dr Jarold [phone]) and to her transplant team Harlene Rumple [phone])

## 2024-02-08 DIAGNOSIS — E113591 Type 2 diabetes mellitus with proliferative diabetic retinopathy without macular edema, right eye: Secondary | ICD-10-CM | POA: Diagnosis not present

## 2024-02-10 NOTE — Telephone Encounter (Signed)
 Received transplant team clearance

## 2024-02-10 NOTE — Progress Notes (Signed)
 GRAYLYN BUNNEY                                          MRN: [id number]   02/10/2024   The VBCI Quality Team Specialist reviewed this patient medical record for the purposes of chart review for care gap closure. The following were reviewed: abstraction for care gap closure-glycemic status assessment.    VBCI Quality Team

## 2024-02-13 NOTE — Progress Notes (Signed)
 Endocrinology Note Date of Visit: 02/13/2024  HPI  Samantha [name] has a hx of HTN, ESRD s/p renal transplant, HLD, anemia, vitamin D deficiency and type 2 diabetes. Past surgical history includes thyroid  nodule removal in 2010.    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    30 day BG average 156, with a time in range of 68%, with 3% low and 1% very low. GMI was 7.0%, her A1C through an outside lab 6 weeks ago was 6.5%   The lows tend to happen at night. She has already decreased her lantus  some, and has noticed improvement.    Current Regimen: Mounjaro  10 mg once weekly. Lantus  15-20 units Am, 10 units PM Novolog  5-14 units with meals Dexcom G7  Urine microalbumin 29- in care everywhere LDL in August 2024 was 50  BP is high today, however she hasn't taken her morning meds today.  No neuropathy. No wounds. Follows with [facility]. Had a laser treatment in office in the right eye.  No chest pain, no shortness of breath.   Review of Systems: An extended ROS was performed with pertinent positives and negatives noted in the HPI.   I have reviewed PMH, SurgH, SH and FH.  There have been no significant changes since the last visit other than those documented in the HPI.   Physical Exam Vitals:   02/13/24 0923  BP: (!) 157/59  BP Location: Left arm  Patient Position: Sitting  Pulse: 62  SpO2: 99%  Weight: 107 kg (235 lb)     General: A&O x 3 CV: RRR, no murmurs Resp: CTAB Psych: Normal mood Foot Exam:   Decreased sensation? no Pre-callous lesions? no Amputations? no Skin break? no Decreased perfusion? no  LABS  Component     Latest Ref Rng 12/15/2023  Sodium     136 - 145 mmol/L 144   Potassium     3.5 - 5.1 mmol/L 4.4   Chloride     98 - 107 mmol/L 110 (H)   CO2     21 - 31 mmol/L 30   Anion Gap     6 - 14 mmol/L 4 (L)   Glucose     70 - 99 mg/dL 71   Blood Urea  Nitrogen     7 - 25 mg/dL 42 (H)    Creatinine     0.60 - 1.20 mg/dL 7.11 (H)   eGFR     >40 mL/min/1.58m2 18 (L)   Albumin      3.5 - 5.7 g/dL 3.7   Total Protein     6.4 - 8.9 g/dL 7.0   Bilirubin Total     0.3 - 1.0 mg/dL 0.4   Alk Phos Total     34 - 104 U/L 86   Aspartate Aminotransferase (AST)     13 - 39 U/L 12 (L)   Alanine Aminotransferase     7 - 52 U/L 11   Calcium  Level Total     8.6 - 10.3 mg/dL 9.1   BUN/Creatinine Ratio     10.0 - 20.0  14.6   Hemoglobin A1c     <5.7 % 6.3 (H)   Estimated Average Glucose     mg/dL 865     Legend: (H) High (L) Low   ASSESSMENT/PLAN  #Type 2 DM She is doing well, GMI is down to 7.0%. She is about to increase her Mounjaro  up to 12.5 mg.  Continue Lantus  15-20 units Am, 10 units PM Continue Novolog  5-14 units with meals Increase mounjaro  to 12.5 mg. If tolerating, can increse to 15 mg.  Continue Dexcom.  Lipid profile with next labs with nephrology- lab slip provided  Return to clinic 6 months  Electronically signed by: Donnice Havens, MD 02/13/2024 9:58 AM

## 2024-02-15 ENCOUNTER — Ambulatory Visit (HOSPITAL_COMMUNITY)
Admission: RE | Admit: 2024-02-15 | Discharge: 2024-02-15 | Disposition: A | Discharge status: Home / Self Care | Source: Ambulatory Visit | Attending: Internal Medicine | Admitting: Internal Medicine

## 2024-02-15 DIAGNOSIS — H349 Unspecified retinal vascular occlusion: Secondary | ICD-10-CM | POA: Insufficient documentation

## 2024-02-23 ENCOUNTER — Other Ambulatory Visit: Payer: Self-pay | Admitting: Internal Medicine

## 2024-02-23 DIAGNOSIS — I779 Disorder of arteries and arterioles, unspecified: Secondary | ICD-10-CM

## 2024-02-23 DIAGNOSIS — I2510 Atherosclerotic heart disease of native coronary artery without angina pectoris: Secondary | ICD-10-CM

## 2024-02-27 ENCOUNTER — Ambulatory Visit: Payer: Self-pay | Admitting: Internal Medicine

## 2024-03-01 DIAGNOSIS — E559 Vitamin D deficiency, unspecified: Secondary | ICD-10-CM | POA: Diagnosis not present

## 2024-03-01 DIAGNOSIS — E1122 Type 2 diabetes mellitus with diabetic chronic kidney disease: Secondary | ICD-10-CM | POA: Diagnosis not present

## 2024-03-01 DIAGNOSIS — Z940 Kidney transplant status: Secondary | ICD-10-CM | POA: Diagnosis not present

## 2024-03-06 ENCOUNTER — Encounter: Payer: Self-pay | Admitting: Internal Medicine

## 2024-03-14 ENCOUNTER — Telehealth (HOSPITAL_BASED_OUTPATIENT_CLINIC_OR_DEPARTMENT_OTHER): Payer: Self-pay

## 2024-03-14 NOTE — Telephone Encounter (Signed)
   Pre-operative Risk Assessment    Patient Name: Samantha [name]  DOB: 1966-05-02 MRN: [id number]   Date of last office visit: NA Date of next office visit: 04/16/24 with Georganna Archer  _NEW PT APPOINTMENT  Request for Surgical Clearance    Procedure:  Robotic assisted laparoscopic hysterectoomy, bilateral salpingo-oophorectomy  Date of Surgery:  Clearance 04/24/24                                 Surgeon:  Dr. [name] Surgeon's Group or Practice Name:  [facility] Phone number:  [phone] Fax number:  [phone]   Type of Clearance Requested:   - Medical    Type of Anesthesia:  General    Additional requests/questions:    [name] [name]   03/14/2024, 12:20 PM

## 2024-03-14 NOTE — Telephone Encounter (Signed)
 Will update all parties pt has appt 04/16/24 with Dr. Floretta.

## 2024-03-14 NOTE — Telephone Encounter (Signed)
   Name: Samantha [name]  DOB: Sep 03, 1965  MRN: [id number]  Primary Cardiologist: None  Chart reviewed as part of pre-operative protocol coverage. The patient has an upcoming visit scheduled with Georganna Archer on 04/16/2024 at which time clearance can be addressed in case there are any issues that would impact surgical recommendations.  Robotic assisted laparoscopic hysterectoomy, bilateral salpingo-oophorectomy Is not scheduled until 04/24/2024 as below. I added preop FYI to appointment note so that provider is aware to address at time of outpatient visit.  Per office protocol the cardiology provider should forward their finalized clearance decision and recommendations regarding antiplatelet therapy to the requesting party below.     I will route this message as FYI to requesting party and remove this message from the preop box as separate preop APP input not needed at this time.   Please call with any questions.  Lamarr Satterfield, NP  03/14/2024, 12:39 PM

## 2024-03-22 ENCOUNTER — Encounter: Payer: Self-pay | Admitting: Psychiatry

## 2024-03-26 ENCOUNTER — Inpatient Hospital Stay: Attending: Psychiatry | Admitting: Psychiatry

## 2024-03-26 ENCOUNTER — Encounter: Payer: Self-pay | Admitting: Psychiatry

## 2024-03-26 ENCOUNTER — Inpatient Hospital Stay: Admitting: Gynecologic Oncology

## 2024-03-26 VITALS — BP 142/56 | HR 70 | Temp 98.7°F | Resp 19 | Ht 67.0 in | Wt 234.8 lb

## 2024-03-26 DIAGNOSIS — N871 Moderate cervical dysplasia: Secondary | ICD-10-CM

## 2024-03-26 DIAGNOSIS — Z940 Kidney transplant status: Secondary | ICD-10-CM | POA: Diagnosis not present

## 2024-03-26 DIAGNOSIS — Z7189 Other specified counseling: Secondary | ICD-10-CM

## 2024-03-26 DIAGNOSIS — D071 Carcinoma in situ of vulva: Secondary | ICD-10-CM | POA: Diagnosis not present

## 2024-03-26 MED ORDER — SENNOSIDES-DOCUSATE SODIUM 8.6-50 MG PO TABS: 2.0000 | ORAL_TABLET | Freq: Every day | ORAL | 0 refills | Status: AC

## 2024-03-26 NOTE — Patient Instructions (Addendum)
 Preparing for your Surgery  Plan for surgery on 04/24/2024 with Dr. [name] at [facility]. You will be scheduled for robotic assisted total laparoscopic hysterectomy (removal of the uterus and cervix), bilateral salpingo-oophorectomy (removal of both ovaries and fallopian tubes), and any other indicated procedures, possible CO2 laser application to the vulva.  PLAN TO HOLD YOUR MOUNJARO  AT LEAST 7 DAYS BEFORE SURGERY.  We reached out to your transplant team and they stated DO NOT HOLD or STOP any transplant medications before and after surgery.  Pre-operative Testing -You will receive a phone call from presurgical testing at [facility] to arrange for a pre-operative appointment and lab work.  -Bring your insurance card, copy of an advanced directive if applicable, medication list  -At that visit, you will be asked to sign a consent for a possible blood transfusion in case a transfusion becomes necessary during surgery.  The need for a blood transfusion is rare but having consent is a necessary part of your care.     -You should not be taking blood thinners or aspirin  at least ten days prior to surgery unless instructed by your surgeon.  -Do not take supplements such as fish oil (omega 3), red yeast rice, turmeric before your surgery. STOP TAKING AT LEAST 10 DAYS BEFORE SURGERY. You want to avoid medications with aspirin  in them including headache powders such as BC or Goody's), Excedrin migraine.  -If you are taking a GLP-1 medication/injection such as Ozempic , Mounjaro , Wegovy, this needs to be held before surgery for at least 7 days before.  Day Before Surgery at Home -You will be asked to take in a light diet the day before surgery. You will be advised you can have clear liquids up until 3 hours before your surgery.    Eat a light diet the day before surgery.  Examples including soups, broths, toast, yogurt, mashed potatoes.  AVOID GAS PRODUCING FOODS AND  BEVERAGES. Things to avoid include carbonated beverages (fizzy beverages, sodas), raw fruits and raw vegetables (uncooked), or beans.   If your bowels are filled with gas, your surgeon will have difficulty visualizing your pelvic organs which increases your surgical risks.  Your role in recovery Your role is to become active as soon as directed by your doctor, while still giving yourself time to heal.  Rest when you feel tired. You will be asked to do the following in order to speed your recovery:  - Cough and breathe deeply. This helps to clear and expand your lungs and can prevent pneumonia after surgery.  - STAY ACTIVE WHEN YOU GET HOME. Do mild physical activity. Walking or moving your legs help your circulation and body functions return to normal. Do not try to get up or walk alone the first time after surgery.   -If you develop swelling on one leg or the other, pain in the back of your leg, redness/warmth in one of your legs, please call the office or go to the Emergency Room to have a doppler to rule out a blood clot. For shortness of breath, chest pain-seek care in the Emergency Room as soon as possible. - Actively manage your pain. Managing your pain lets you move in comfort. We will ask you to rate your pain on a scale of zero to 10. It is your responsibility to tell your doctor or nurse where and how much you hurt so your pain can be treated.  Special Considerations -If you are diabetic, you may be placed on  insulin  after surgery to have closer control over your blood sugars to promote healing and recovery.  This does not mean that you will be discharged on insulin .  If applicable, your oral antidiabetics will be resumed when you are tolerating a solid diet.  -Your final pathology results from surgery should be available around one week after surgery and the results will be relayed to you when available.  -Dr. Olam Mill is the surgeon that assists your GYN Oncologist with  surgery.  If you end up staying the night, the next day after your surgery you will either see Dr. Viktoria, Dr. Eldonna, or Dr. Olam Mill.  -FMLA forms can be faxed to [phone] and please allow 5-7 business days for completion.  Pain Management After Surgery -You will be prescribed your pain medication and bowel regimen medications before surgery so that you can have these available when you are discharged from the hospital. The pain medication is for use ONLY AFTER surgery and a new prescription will not be given.   -Make sure that you have Tylenol  IF YOU ARE ABLE TO TAKE THESE MEDICATION at home to use on a regular basis after surgery for pain control.  -Review the attached handout on narcotic use and their risks and side effects.   Bowel Regimen -You will be prescribed Sennakot-S to take nightly to prevent constipation especially if you are taking the narcotic pain medication intermittently.  It is important to prevent constipation and drink adequate amounts of liquids. You can stop taking this medication when you are not taking pain medication and you are back on your normal bowel routine.  Risks of Surgery Risks of surgery are low but include bleeding, infection, damage to surrounding structures, re-operation, blood clots, and very rarely death.   Blood Transfusion Information (For the consent to be signed before surgery)  We will be checking your blood type before surgery so in case of emergencies, we will know what type of blood you would need.                                            WHAT IS A BLOOD TRANSFUSION?  A transfusion is the replacement of blood or some of its parts. Blood is made up of multiple cells which provide different functions. Red blood cells carry oxygen and are used for blood loss replacement. White blood cells fight against infection. Platelets control bleeding. Plasma helps clot blood. Other blood products are available for specialized needs,  such as hemophilia or other clotting disorders. BEFORE THE TRANSFUSION  Who gives blood for transfusions?  You may be able to donate blood to be used at a later date on yourself (autologous donation). Relatives can be asked to donate blood. This is generally not any safer than if you have received blood from a stranger. The same precautions are taken to ensure safety when a relative's blood is donated. Healthy volunteers who are fully evaluated to make sure their blood is safe. This is blood bank blood. Transfusion therapy is the safest it has ever been in the practice of medicine. Before blood is taken from a donor, a complete history is taken to make sure that person has no history of diseases nor engages in risky social behavior (examples are intravenous drug use or sexual activity with multiple partners). The donor's travel history is screened to minimize risk of transmitting infections,  such as malaria. The donated blood is tested for signs of infectious diseases, such as HIV and hepatitis. The blood is then tested to be sure it is compatible with you in order to minimize the chance of a transfusion reaction. If you or a relative donates blood, this is often done in anticipation of surgery and is not appropriate for emergency situations. It takes many days to process the donated blood. RISKS AND COMPLICATIONS Although transfusion therapy is very safe and saves many lives, the main dangers of transfusion include:  Getting an infectious disease. Developing a transfusion reaction. This is an allergic reaction to something in the blood you were given. Every precaution is taken to prevent this. The decision to have a blood transfusion has been considered carefully by your caregiver before blood is given. Blood is not given unless the benefits outweigh the risks.  AFTER SURGERY INSTRUCTIONS  Return to work: 4-6 weeks if applicable  Activity: 1. Be up and out of the bed during the day.  Take a nap if  needed.  You may walk up steps but be careful and use the hand rail.  Stair climbing will tire you more than you think, you may need to stop part way and rest.   2. No lifting or straining for 6 weeks over 10 pounds. No pushing, pulling, straining for 6 weeks.  3. No driving for 4-89 days when the following criteria have been met: Do not drive if you are taking narcotic pain medicine and make sure that your reaction time has returned.   4. You can shower as soon as the next day after surgery. Shower daily.  Use your regular soap and water  (not directly on the incision) and pat your incision(s) dry afterwards; don't rub.  No tub baths or submerging your body in water  until cleared by your surgeon. If you have the soap that was given to you by pre-surgical testing that was used before surgery, you do not need to use it afterwards because this can irritate your incisions.   5. No sexual activity and nothing in the vagina for 12 weeks.  6. You may experience a small amount of clear drainage from your incisions, which is normal.  If the drainage persists, increases, or changes color please call the office.  7. Do not use creams, lotions, or ointments such as neosporin on your incisions after surgery until advised by your surgeon because they can cause removal of the dermabond glue on your incisions.    8. You may experience vaginal spotting after surgery or when the stitches at the top of the vagina begin to dissolve.  The spotting is normal but if you experience heavy bleeding, call our office.  9. Take Tylenol  first for pain if you are able to take these medication and only use narcotic pain medication for severe pain not relieved by the Tylenol .  Monitor your Tylenol  intake to a max of 4,000 mg in a 24 hour period.   Diet: 1. Low sodium Heart Healthy Diet is recommended but you are cleared to resume your normal (before surgery) diet after your procedure.  2. It is safe to use a laxative, such as  Miralax or Colace, if you have difficulty moving your bowels before surgery. You have been prescribed Sennakot-S to take at bedtime every evening after surgery to keep bowel movements regular and to prevent constipation.    Wound Care: 1. Keep clean and dry.  Shower daily.  Reasons to call the Doctor:  Fever - Oral temperature greater than 100.4 degrees Fahrenheit Foul-smelling vaginal discharge Difficulty urinating Nausea and vomiting Increased pain at the site of the incision that is unrelieved with pain medicine. Difficulty breathing with or without chest pain New calf pain especially if only on one side Sudden, continuing increased vaginal bleeding with or without clots.   Contacts: For questions or concerns you should contact:  Dr. [name] at [phone]  Eleanor Epps, NP at [phone]  After Hours: call [phone] and have the GYN Oncologist paged/contacted (after 5 pm or on the weekends). You will speak with an after hours RN and let he or she know you have had surgery.  Messages sent via mychart are for non-urgent matters and are not responded to after hours so for urgent needs, please call the after hours number.

## 2024-03-26 NOTE — Progress Notes (Signed)
 Patient here for a follow up visit with Dr. Eldonna and for a pre-operative appointment prior to her scheduled surgery on 04/24/2024. She is scheduled for a robotic assisted total laparoscopic hysterectomy, possible CO2 laser application to the vulva.and any other indicated procedures . The surgery was discussed in detail.  See after visit summary for additional details.    Discussed post-op pain management in detail including the aspects of the enhanced recovery pathway.  Advised her that a new prescription would be sent in for Oxycodone  and it is only to be used for after her upcoming surgery.  We discussed the use of tylenol  post-op and to monitor for a maximum of 4,000 mg in a 24 hour period.  Also let her know that sennakot has been prescribed to be used after surgery and to hold if having loose stools.  Discussed bowel regimen in detail.     Discussed the use of SCDs and measures to take at home to prevent DVT including frequent mobility.  Reportable signs and symptoms of DVT discussed. Post-operative instructions discussed and expectations for after surgery. Incisional care discussed as well including reportable signs and symptoms including erythema, drainage, wound separation.     30 minutes spent with the patient.  Verbalizing understanding of material discussed. No needs or concerns voiced at the end of the visit.   Advised patient to call for any needs.  Advised that her post-operative medications had been prescribed and could be picked up at any time.    This appointment is included in the global surgical bundle as pre-operative teaching and has no charge.

## 2024-03-26 NOTE — Progress Notes (Signed)
 Gynecologic Oncology Return Clinic Visit  Date of Service: 03/26/2024 Referring Provider: Rubie Husky, MD   Assessment & Plan: Samantha [name] is a 58 y.o. 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  Cervical dysplasia: - Reviewed pros and cons of surgical management of persistent cervical dysplasia. On exam, cervix is flush and difficult to visualize which may limit ability to safely monitor in the future. Would not likely be able to safely undergo any additional excisional procedures. - >6wk since last excisional procedure. - Will proceed with hysterectomy. Reviewed pros/cons of oophorectomy. Pt declines oophorectomy. Patient was consented for: robotic assisted total laparoscopic hysterectomy, bilateral salpingectomy, possible CO2 laser of the vulva on 04/24/24.  The risks of surgery were discussed in detail and she understands these to including but not limited to bleeding requiring a blood transfusion, infection, injury to adjacent organs (including but not limited to the bowels, bladder, ureters, nerves, blood vessels), thromboembolic events, wound separation, hernia, vaginal cuff separation, unforseen complication, possible need for re-exploration, and medical complications such as heart attack, stroke, pneumonia.  If the patient experiences any of these events, she understands that her hospitalization or recovery may be prolonged and that she may need to take additional medications for a prolonged period. The patient will receive DVT and antibiotic prophylaxis as indicated. She voiced a clear understanding. She had the opportunity to ask questions and informed consent was obtained today. She wishes to proceed.  Has been cleared by transplant for surgery. Will not hold any  meds Her METs are >4.  All preoperative instructions were reviewed. Postoperative expectations were also reviewed. Written handouts were provided to the patient.    VIN3: - Biopsy proven VIN 3. - Based on exam, extensive disease bilaterally. - Topical treatment with Aldara  recommended - Started after last visit on 01/30/24, approximately 8 weeks.  - Exam today without significant change - Education provided on proper application - Will consider CO2 laser ablation at time of hysterectomy pending additional improvements not until surgery. See above  RTC postop  [name], MD Gynecologic Oncology   Medical Decision Making I personally spent  TOTAL 30 minutes face-to-face and non-face-to-face in the care of this patient, which includes all pre, intra, and post visit time on the date of service.   ----------------------- Reason for Visit: Follow-up, treatment discussion  Treatment History: Patient has a history of abnormal Pap smears as follows: 03/02/2016: Pap NILM 06/10/2017: Pap ASCUS H, HPV negative 07/13/2017: Colpo visually normal, ECC benign 02/08/2019: Pap NILM, HR HPV positive 03/08/2019: Colpo visually normal, ECC benign 01/04/2022: Pap ASCUS, HPV 16 positive 01/26/2022: Colpo visually normal, ECC benign 02/15/2023: Pap L SIL, HPV 16 positive 03/25/2023: Colpo biopsy CIN-1, ECC CIN-2 09/02/23: LEEP residual CIN-2, positive margin at the endocervix 11/29/2023: CKC benign, LSIL/CIN-1 with focal area concerning for H SIL/CIN-2 on ECC   Patient's medical history is complicated by a history of chronic kidney disease, now status post kidney transplant on immunosuppression.  She was seen by OB/GYN on 12/22/2023 for discussion of possible hysterectomy.  Given her pelvic kidney, she was referred to gynecology for surgical complexity.   In terms of her transplant history, she reports that her transplant nephrology team is at [facility].  She otherwise follows with Dr. Bernardino Gasman with [facility] locally in [place].  She underwent a cardiac catheterization in  2017.  She reports that this may have been a part of her workup for her kidney transplant.  She denies any chest pain or shortness of breath.  In terms of her diabetes her most recent A1c was on 12/15/2023 and was 6.3.  She denies any vulvar itching today.  Interval History: No bleeding. Pelvic pain. Usin0g cream, no issues, no burni0gn or ulcers Can't tetll if better on vulva No itching   Past Medical/Surgical History: Past Medical History:  Diagnosis Date   Anemia in chronic kidney disease (CKD)    Chronic kidney disease, stage V (HCC)    s/p kidney transplant 03/28/2020   Hypertension    Kidney transplanted    Uncontrolled diabetes mellitus with microalbuminuria or microproteinuria     Past Surgical History:  Procedure Laterality Date   CARDIAC CATHETERIZATION N/A 03/23/2016   Procedure: Left Heart Cath and Coronary Angiography;  Surgeon: Gordy Bergamo, MD;  Location: [facility] INVASIVE CV LAB;  Service: Cardiovascular;  Laterality: N/A;   CATARACT EXTRACTION W/ INTRAOCULAR LENS IMPLANT Bilateral    CERVICAL CONIZATION W/BX N/A 11/29/2023   Procedure: CONE BIOPSY, CERVIX;  Surgeon: Lilton Legions, DO;  Location: [facility] OR;  Service: Gynecology;  Laterality: N/A;   CESAREAN SECTION  2005   CESAREAN SECTION  1993   DILITATION & CURRETTAGE/HYSTROSCOPY WITH NOVASURE ABLATION N/A 10/24/2014   Procedure: DILATATION & CURETTAGE/HYSTEROSCOPY WITH NOVASURE ABLATION;  Surgeon: Gigi Botts, MD;  Location: [facility] ORS;  Service: Gynecology;  Laterality: N/A;   KIDNEY TRANSPLANT  03/28/2020   [facility]   LEEP N/A 09/02/2023   Procedure: LEEP (LOOP ELECTROSURGICAL EXCISION PROCEDURE);  Surgeon: Lilton Legions, DO;  Location[facility] OR;  Service: Gynecology;  Laterality: N/A;   THYROIDECTOMY  2009   partial   TUBAL LIGATION  2005    Family History  Problem Relation Age of Onset   Hypertension Mother     Diabetes Mother    Kidney disease Mother    Crohn's disease Mother    Diabetes Father    Hypertension Father    Ulcers Father    Kidney disease Father    Breast cancer Neg Hx    Ovarian cancer Neg Hx    Endometrial cancer Neg Hx    Colon cancer Neg Hx     Social History   Socioeconomic History   Marital status: Divorced    Spouse name: Not on file   Number of children: Not on file   Years of education: Not on file   Highest education level: Not on file  Occupational History   Not on file  Tobacco Use   Smoking status: Never   Smokeless tobacco: Never  Vaping Use   Vaping status: Never Used  Substance and Sexual Activity   Alcohol use: No   Drug use: No   Sexual activity: Not Currently    Birth control/protection: Other-see comments    Comment: Ablation  Other Topics Concern   Not on file  Social History Narrative   Not on file   Social Drivers of Health   Financial Resource Strain: Low Risk  (04/14/2023)   Overall Financial Resource Strain (CARDIA)    Difficulty of Paying Living Expenses: Not hard at all  Food Insecurity: Low Risk  (08/01/2023)   Received from [facility]   Hunger Vital Sign    Within the past 12 months, you worried that your food would run out before you got money to buy more: Never true    Within the past 12  months, the food you bought just didn't last and you didn't have money to get more. : Never true  Transportation Needs: No Transportation Needs (08/01/2023)   Received from [facility]   Transportation    In the past 12 months, has lack of reliable transportation kept you from medical appointments, meetings, work or from getting things needed for daily living? : No  Physical Activity: Inactive (04/14/2023)   Exercise Vital Sign    Days of Exercise per Week: 0 days    Minutes of Exercise per Session: 0 min  Stress: No Stress Concern Present (04/14/2023)   [employer] of Occupational Health - Occupational Stress Questionnaire     Feeling of Stress : Not at all  Social Connections: Moderately Isolated (04/14/2023)   Social Connection and Isolation Panel    Frequency of Communication with Friends and Family: More than three times a week    Frequency of Social Gatherings with Friends and Family: Once a week    Attends Religious Services: Never    Database Administrator or Organizations: Yes    Attends Engineer, Structural: 1 to 4 times per year    Marital Status: Divorced    Current Medications:  Current Outpatient Medications:    atorvastatin  (LIPITOR) 80 MG tablet, Take 80 mg by mouth every evening., Disp: , Rfl:    azaTHIOprine (IMURAN) 50 MG tablet, Take 50 mg by mouth in the morning. 50 mg + 75 mg=125 mg daily, Disp: , Rfl:    Azathioprine 75 MG TABS, Take 75 mg by mouth in the morning. 75 mg + 50 mg=125 mg daily, Disp: , Rfl:    carvedilol (COREG) 6.25 MG tablet, Take 6.25 mg by mouth in the morning and at bedtime., Disp: , Rfl:    Continuous Glucose Receiver (DEXCOM G7 RECEIVER) DEVI, , Disp: , Rfl:    imiquimod  (ALDARA ) 5 % cream, Apply topically 3 (three) times a week., Disp: 48 each, Rfl: 3   insulin  glargine (LANTUS  SOLOSTAR) 100 UNIT/ML Solostar Pen, Inject 15 units sq nightly, Disp: , Rfl:    insulin  lispro (HUMALOG  KWIKPEN) 100 UNIT/ML KwikPen, Per sliding scale through [facility] transplant team, Disp: 15 mL, Rfl: 11   lidocaine  (XYLOCAINE ) 5 % ointment, Apply 1 Application topically as needed., Disp: 30 g, Rfl: 1   MOUNJARO  12.5 MG/0.5ML Pen, , Disp: , Rfl:    NIFEdipine  (PROCARDIA  XL/NIFEDICAL XL) 60 MG 24 hr tablet, Take 60 mg by mouth daily., Disp: , Rfl:    predniSONE  (DELTASONE ) 5 MG tablet, Take 5 mg by mouth in the morning., Disp: , Rfl:    sulfamethoxazole-trimethoprim (BACTRIM) 400-80 MG tablet, Take 1 tablet by mouth every Monday, Wednesday, and Friday. In the morning., Disp: , Rfl:    tacrolimus  (PROGRAF ) 1 MG capsule, Take 2 mg by mouth in the morning and at bedtime., Disp: , Rfl:     torsemide (DEMADEX) 20 MG tablet, Take 20 mg by mouth in the morning., Disp: , Rfl:   Review of Symptoms: Complete 10-system review is positive for: none  Physical Exam: BP (!) 147/62 (BP Location: Left Arm, Patient Position: Sitting)   Pulse 70   Temp 98.7 F (37.1 C) (Oral)   Resp 19   Ht 5' 7 (1.702 m)   Wt 234 lb 12.8 oz (106.5 kg)   LMP  (LMP Unknown)   SpO2 97%   BMI 36.77 kg/m  General: Alert, oriented, no acute distress. HEENT: Normocephalic, atraumatic.  Chest: Normal work of  breathing. CTAB CV: RRR Abdomen: Soft, normoactive bowel sounds, non TTP Extremities: Grossly normal range of motion.  Warm, well perfused.  1+ pitting edema equal bilaterally Skin: No rashes or lesions noted. GU: External genitalia with multifocal verrucous lesions bilaterally.  Stable from prior.  Speculum exam with flush cervix, normal vaginal mucosa. Bimanual exam with flush cervix with some irregularity to surface, likely 2/2 prior excision procedures. Uterus and adnexa not distinctly palpated due to body habitus. Exam chaperoned by Kimberly Jordan, CMA   Laboratory & Radiologic Studies: None

## 2024-03-26 NOTE — H&P (View-Only) (Signed)
 Gynecologic Oncology Return Clinic Visit  Date of Service: 03/26/2024 Referring Provider: Rubie Husky, MD   Assessment & Plan: Samantha [name] is a 58 y.o. 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  Cervical dysplasia: - Reviewed pros and cons of surgical management of persistent cervical dysplasia. On exam, cervix is flush and difficult to visualize which may limit ability to safely monitor in the future. Would not likely be able to safely undergo any additional excisional procedures. - >6wk since last excisional procedure. - Will proceed with hysterectomy. Reviewed pros/cons of oophorectomy. Pt declines oophorectomy. Patient was consented for: robotic assisted total laparoscopic hysterectomy, bilateral salpingectomy, possible CO2 laser of the vulva on 04/24/24.  The risks of surgery were discussed in detail and she understands these to including but not limited to bleeding requiring a blood transfusion, infection, injury to adjacent organs (including but not limited to the bowels, bladder, ureters, nerves, blood vessels), thromboembolic events, wound separation, hernia, vaginal cuff separation, unforseen complication, possible need for re-exploration, and medical complications such as heart attack, stroke, pneumonia.  If the patient experiences any of these events, she understands that her hospitalization or recovery may be prolonged and that she may need to take additional medications for a prolonged period. The patient will receive DVT and antibiotic prophylaxis as indicated. She voiced a clear understanding. She had the opportunity to ask questions and informed consent was obtained today. She wishes to proceed.  Has been cleared by transplant for surgery. Will not hold any  meds Her METs are >4.  All preoperative instructions were reviewed. Postoperative expectations were also reviewed. Written handouts were provided to the patient.    VIN3: - Biopsy proven VIN 3. - Based on exam, extensive disease bilaterally. - Topical treatment with Aldara  recommended - Started after last visit on 01/30/24, approximately 8 weeks.  - Exam today without significant change - Education provided on proper application - Will consider CO2 laser ablation at time of hysterectomy pending additional improvements not until surgery. See above  RTC postop  [name], MD Gynecologic Oncology   Medical Decision Making I personally spent  TOTAL 30 minutes face-to-face and non-face-to-face in the care of this patient, which includes all pre, intra, and post visit time on the date of service.   ----------------------- Reason for Visit: Follow-up, treatment discussion  Treatment History: Patient has a history of abnormal Pap smears as follows: 03/02/2016: Pap NILM 06/10/2017: Pap ASCUS H, HPV negative 07/13/2017: Colpo visually normal, ECC benign 02/08/2019: Pap NILM, HR HPV positive 03/08/2019: Colpo visually normal, ECC benign 01/04/2022: Pap ASCUS, HPV 16 positive 01/26/2022: Colpo visually normal, ECC benign 02/15/2023: Pap L SIL, HPV 16 positive 03/25/2023: Colpo biopsy CIN-1, ECC CIN-2 09/02/23: LEEP residual CIN-2, positive margin at the endocervix 11/29/2023: CKC benign, LSIL/CIN-1 with focal area concerning for H SIL/CIN-2 on ECC   Patient's medical history is complicated by a history of chronic kidney disease, now status post kidney transplant on immunosuppression.  She was seen by OB/GYN on 12/22/2023 for discussion of possible hysterectomy.  Given her pelvic kidney, she was referred to gynecology for surgical complexity.   In terms of her transplant history, she reports that her transplant nephrology team is at [facility].  She otherwise follows with Dr. Bernardino Gasman with [facility] locally in [place].  She underwent a cardiac catheterization in  2017.  She reports that this may have been a part of her workup for her kidney transplant.  She denies any chest pain or shortness of breath.  In terms of her diabetes her most recent A1c was on 12/15/2023 and was 6.3.  She denies any vulvar itching today.  Interval History: No bleeding. Pelvic pain. Usin0g cream, no issues, no burni0gn or ulcers Can't tetll if better on vulva No itching   Past Medical/Surgical History: Past Medical History:  Diagnosis Date   Anemia in chronic kidney disease (CKD)    Chronic kidney disease, stage V (HCC)    s/p kidney transplant 03/28/2020   Hypertension    Kidney transplanted    Uncontrolled diabetes mellitus with microalbuminuria or microproteinuria     Past Surgical History:  Procedure Laterality Date   CARDIAC CATHETERIZATION N/A 03/23/2016   Procedure: Left Heart Cath and Coronary Angiography;  Surgeon: Gordy Bergamo, MD;  Location: [facility] INVASIVE CV LAB;  Service: Cardiovascular;  Laterality: N/A;   CATARACT EXTRACTION W/ INTRAOCULAR LENS IMPLANT Bilateral    CERVICAL CONIZATION W/BX N/A 11/29/2023   Procedure: CONE BIOPSY, CERVIX;  Surgeon: Lilton Legions, DO;  Location: [facility] OR;  Service: Gynecology;  Laterality: N/A;   CESAREAN SECTION  2005   CESAREAN SECTION  1993   DILITATION & CURRETTAGE/HYSTROSCOPY WITH NOVASURE ABLATION N/A 10/24/2014   Procedure: DILATATION & CURETTAGE/HYSTEROSCOPY WITH NOVASURE ABLATION;  Surgeon: Gigi Botts, MD;  Location: [facility] ORS;  Service: Gynecology;  Laterality: N/A;   KIDNEY TRANSPLANT  03/28/2020   [facility]   LEEP N/A 09/02/2023   Procedure: LEEP (LOOP ELECTROSURGICAL EXCISION PROCEDURE);  Surgeon: Lilton Legions, DO;  Location: [facility] OR;  Service: Gynecology;  Laterality: N/A;   THYROIDECTOMY  2009   partial   TUBAL LIGATION  2005    Family History  Problem Relation Age of Onset   Hypertension Mother     Diabetes Mother    Kidney disease Mother    Crohn's disease Mother    Diabetes Father    Hypertension Father    Ulcers Father    Kidney disease Father    Breast cancer Neg Hx    Ovarian cancer Neg Hx    Endometrial cancer Neg Hx    Colon cancer Neg Hx     Social History   Socioeconomic History   Marital status: Divorced    Spouse name: Not on file   Number of children: Not on file   Years of education: Not on file   Highest education level: Not on file  Occupational History   Not on file  Tobacco Use   Smoking status: Never   Smokeless tobacco: Never  Vaping Use   Vaping status: Never Used  Substance and Sexual Activity   Alcohol use: No   Drug use: No   Sexual activity: Not Currently    Birth control/protection: Other-see comments    Comment: Ablation  Other Topics Concern   Not on file  Social History Narrative   Not on file   Social Drivers of Health   Financial Resource Strain: Low Risk  (04/14/2023)   Overall Financial Resource Strain (CARDIA)    Difficulty of Paying Living Expenses: Not hard at all  Food Insecurity: Low Risk  (08/01/2023)   Received from [facility]   Hunger Vital Sign    Within the past 12 months, you worried that your food would run out before you got money to buy more: Never true    Within the past 12  months, the food you bought just didn't last and you didn't have money to get more. : Never true  Transportation Needs: No Transportation Needs (08/01/2023)   Received from [facility]   Transportation    In the past 12 months, has lack of reliable transportation kept you from medical appointments, meetings, work or from getting things needed for daily living? : No  Physical Activity: Inactive (04/14/2023)   Exercise Vital Sign    Days of Exercise per Week: 0 days    Minutes of Exercise per Session: 0 min  Stress: No Stress Concern Present (04/14/2023)   [employer] of Occupational Health - Occupational Stress Questionnaire     Feeling of Stress : Not at all  Social Connections: Moderately Isolated (04/14/2023)   Social Connection and Isolation Panel    Frequency of Communication with Friends and Family: More than three times a week    Frequency of Social Gatherings with Friends and Family: Once a week    Attends Religious Services: Never    Database Administrator or Organizations: Yes    Attends Engineer, Structural: 1 to 4 times per year    Marital Status: Divorced    Current Medications:  Current Outpatient Medications:    atorvastatin  (LIPITOR) 80 MG tablet, Take 80 mg by mouth every evening., Disp: , Rfl:    azaTHIOprine (IMURAN) 50 MG tablet, Take 50 mg by mouth in the morning. 50 mg + 75 mg=125 mg daily, Disp: , Rfl:    Azathioprine 75 MG TABS, Take 75 mg by mouth in the morning. 75 mg + 50 mg=125 mg daily, Disp: , Rfl:    carvedilol (COREG) 6.25 MG tablet, Take 6.25 mg by mouth in the morning and at bedtime., Disp: , Rfl:    Continuous Glucose Receiver (DEXCOM G7 RECEIVER) DEVI, , Disp: , Rfl:    imiquimod  (ALDARA ) 5 % cream, Apply topically 3 (three) times a week., Disp: 48 each, Rfl: 3   insulin  glargine (LANTUS  SOLOSTAR) 100 UNIT/ML Solostar Pen, Inject 15 units sq nightly, Disp: , Rfl:    insulin  lispro (HUMALOG  KWIKPEN) 100 UNIT/ML KwikPen, Per sliding scale through [facility] transplant team, Disp: 15 mL, Rfl: 11   lidocaine  (XYLOCAINE ) 5 % ointment, Apply 1 Application topically as needed., Disp: 30 g, Rfl: 1   MOUNJARO  12.5 MG/0.5ML Pen, , Disp: , Rfl:    NIFEdipine  (PROCARDIA  XL/NIFEDICAL XL) 60 MG 24 hr tablet, Take 60 mg by mouth daily., Disp: , Rfl:    predniSONE  (DELTASONE ) 5 MG tablet, Take 5 mg by mouth in the morning., Disp: , Rfl:    sulfamethoxazole-trimethoprim (BACTRIM) 400-80 MG tablet, Take 1 tablet by mouth every Monday, Wednesday, and Friday. In the morning., Disp: , Rfl:    tacrolimus  (PROGRAF ) 1 MG capsule, Take 2 mg by mouth in the morning and at bedtime., Disp: , Rfl:     torsemide (DEMADEX) 20 MG tablet, Take 20 mg by mouth in the morning., Disp: , Rfl:   Review of Symptoms: Complete 10-system review is positive for: none  Physical Exam: BP (!) 147/62 (BP Location: Left Arm, Patient Position: Sitting)   Pulse 70   Temp 98.7 F (37.1 C) (Oral)   Resp 19   Ht 5' 7 (1.702 m)   Wt 234 lb 12.8 oz (106.5 kg)   LMP  (LMP Unknown)   SpO2 97%   BMI 36.77 kg/m  General: Alert, oriented, no acute distress. HEENT: Normocephalic, atraumatic.  Chest: Normal work of  breathing. CTAB CV: RRR Abdomen: Soft, normoactive bowel sounds, non TTP Extremities: Grossly normal range of motion.  Warm, well perfused.  1+ pitting edema equal bilaterally Skin: No rashes or lesions noted. GU: External genitalia with multifocal verrucous lesions bilaterally.  Stable from prior.  Speculum exam with flush cervix, normal vaginal mucosa. Bimanual exam with flush cervix with some irregularity to surface, likely 2/2 prior excision procedures. Uterus and adnexa not distinctly palpated due to body habitus. Exam chaperoned by Kimberly Jordan, CMA   Laboratory & Radiologic Studies: None

## 2024-03-27 ENCOUNTER — Other Ambulatory Visit: Payer: Self-pay | Admitting: Gynecologic Oncology

## 2024-03-27 DIAGNOSIS — D071 Carcinoma in situ of vulva: Secondary | ICD-10-CM

## 2024-03-27 DIAGNOSIS — N871 Moderate cervical dysplasia: Secondary | ICD-10-CM

## 2024-03-27 MED ORDER — OXYCODONE HCL 5 MG PO TABS: 5.0000 mg | ORAL_TABLET | Freq: Two times a day (BID) | ORAL | 0 refills | Status: AC | PRN

## 2024-03-27 NOTE — Progress Notes (Signed)
 Oxycodone  sent in for use post-operatively from surgery on 04/24/24 with Dr. Eldonna. Patient reported being able to take oxycodone . Dose adjusted given kidney function/hx

## 2024-04-11 ENCOUNTER — Telehealth: Payer: Self-pay

## 2024-04-11 ENCOUNTER — Telehealth: Payer: Self-pay | Admitting: Oncology

## 2024-04-11 NOTE — Telephone Encounter (Signed)
 The office received FMLA forms from pt's employer [employer]. Requested information was provided and emailed to Ms. Mack-Johnson at [email or web address]

## 2024-04-11 NOTE — Telephone Encounter (Signed)
 Samantha [name] called and had a question about surgery on 04/24/2024.  She said she had a tubal ligation and is wondering if her fallopian tubes would still be attached to her uterus.  If they would are not, she would like to just have the hysterectomy to avoid extra surgery.  Advised I will find out and call her back.

## 2024-04-12 NOTE — Telephone Encounter (Signed)
 Called Samantha [name] and let her know that the fallopian tubes will be removed as part of the uterus at the time of surgery.  Discussed that the fallopian tubes are attached to the uterus. She verbalized understanding and is ok with have the fallopian tubes removed.

## 2024-04-15 NOTE — Progress Notes (Signed)
  Cardiology Office Note:   Date:  04/15/2024  ID:  Samantha UHLS, DOB [birth year], MRN [id number] PCP: Samantha Medici, MD  [name] Providers Cardiologist:  None { Chief Complaint: No chief complaint on file.     History of Present Illness:   Samantha [name] is a 58 y.o. female with a PMH of HTN, HLD, DM2, ESRD s/p KT (2021), obesity, and mild nonobstructive CAS who presents as a new patient referral by Dr. Medici Samantha for preoperative cardiovascular risk assessment.  She underwent bilateral carotid artery duplex which revealed bilateral CAS at 1-39%.  The patient is scheduled to undergo total hysterectomy due to cervical neoplasia.  Patient underwent a normal LHC in 2017.   Past Medical History:  Diagnosis Date   Anemia in chronic kidney disease (CKD)    Chronic kidney disease, stage V (HCC)    s/p kidney transplant 03/28/2020   Hypertension    Kidney transplanted    Uncontrolled diabetes mellitus with microalbuminuria or microproteinuria      Studies Reviewed:    EKG: ***       Cardiac Studies & Procedures   ______________________________________________________________________________________________ CARDIAC CATHETERIZATION  CARDIAC CATHETERIZATION 03/23/2016  Conclusion  LV end diastolic pressure is mildly elevated at 19 mm Hg. No LV gram performed to preserve contrast.  Normal coronary arteries, right dominant. 15 mL contrast used.  Patient's findings are consistent with nonischemic hypertensive heart disease.  Patient be discharged home today, with outpatient follow-up. Needs better control of blood pressure.  Findings Coronary Findings Diagnostic  Dominance: Right  Left Main Vessel was injected. Vessel is normal in caliber. Vessel is angiographically normal.  Left Anterior Descending Vessel was injected. Vessel is normal in caliber. Vessel is angiographically normal.  Left Circumflex Vessel was injected. Vessel is  normal in caliber. Vessel is angiographically normal.  Right Coronary Artery Vessel was injected. Vessel is normal in caliber. Vessel is angiographically normal.  Intervention  No interventions have been documented.              ______________________________________________________________________________________________      Risk Assessment/Calculations:   {Does this patient have ATRIAL FIBRILLATION?:[phone]} No BP recorded.  {Refresh Note OR Click here to enter BP  :1}***        Physical Exam:     VS:  LMP  (LMP Unknown)  ***    Wt Readings from Last 3 Encounters:  03/26/24 234 lb 12.8 oz (106.5 kg)  01/30/24 235 lb 3.2 oz (106.7 kg)  01/16/24 234 lb (106.1 kg)     GEN: Well nourished, well developed, in no acute distress NECK: No JVD; No carotid bruits CARDIAC: ***RRR, no murmurs, rubs, gallops RESPIRATORY:  Clear to auscultation without rales, wheezing or rhonchi  ABDOMEN: Soft, non-tender, non-distended, normal bowel sounds EXTREMITIES:  Warm and well perfused, no edema; No deformity, 2+ radial pulses PSYCH: Normal mood and affect   Assessment & Plan Bilateral carotid artery stenosis Start aspirin  81 mg daily Mixed hyperlipidemia Start Zetia      {Are you ordering a CV Procedure (e.g. stress test, cath, DCCV, TEE, etc)?   Press F2        :[id number]}   This note was written with the assistance of a dictation microphone or AI dictation software. Please excuse any typos or grammatical errors.   Signed, Georganna Archer, MD 04/15/2024 8:35 PM     [facility]

## 2024-04-15 NOTE — Assessment & Plan Note (Signed)
 Start Zetia

## 2024-04-16 ENCOUNTER — Other Ambulatory Visit: Payer: Self-pay | Admitting: Gynecologic Oncology

## 2024-04-16 ENCOUNTER — Encounter: Payer: Self-pay | Admitting: Student in an Organized Health Care Education/Training Program

## 2024-04-16 ENCOUNTER — Ambulatory Visit: Attending: Cardiology | Admitting: Student in an Organized Health Care Education/Training Program

## 2024-04-16 ENCOUNTER — Other Ambulatory Visit (HOSPITAL_COMMUNITY): Payer: Self-pay

## 2024-04-16 VITALS — BP 170/76 | HR 64 | Ht 67.75 in | Wt 240.4 lb

## 2024-04-16 DIAGNOSIS — I6523 Occlusion and stenosis of bilateral carotid arteries: Secondary | ICD-10-CM

## 2024-04-16 DIAGNOSIS — E782 Mixed hyperlipidemia: Secondary | ICD-10-CM

## 2024-04-16 DIAGNOSIS — N871 Moderate cervical dysplasia: Secondary | ICD-10-CM

## 2024-04-16 DIAGNOSIS — D071 Carcinoma in situ of vulva: Secondary | ICD-10-CM

## 2024-04-16 DIAGNOSIS — I10 Essential (primary) hypertension: Secondary | ICD-10-CM | POA: Diagnosis not present

## 2024-04-16 DIAGNOSIS — Z01810 Encounter for preprocedural cardiovascular examination: Secondary | ICD-10-CM

## 2024-04-16 DIAGNOSIS — I517 Cardiomegaly: Secondary | ICD-10-CM | POA: Diagnosis not present

## 2024-04-16 DIAGNOSIS — R011 Cardiac murmur, unspecified: Secondary | ICD-10-CM | POA: Insufficient documentation

## 2024-04-16 MED ORDER — NIFEDIPINE ER 90 MG PO TB24: 90.0000 mg | ORAL_TABLET | Freq: Every day | ORAL | 3 refills | Status: AC

## 2024-04-16 MED ORDER — ASPIRIN 81 MG PO TBEC: 81.0000 mg | DELAYED_RELEASE_TABLET | Freq: Every day | ORAL | 3 refills | Status: AC

## 2024-04-16 NOTE — Assessment & Plan Note (Signed)
-   Noted to have a heart murmur on physical exam.  Will get echocardiogram. Complete echocardiogram

## 2024-04-16 NOTE — Assessment & Plan Note (Signed)
-   Blood pressure is wildly uncontrolled today, but she had just taken her blood pressure medicine prior to coming. - Repeat blood pressure check later in the visit showed a systolic blood pressure in the 150s. - Her goal BP is <130/80. - Given her CKD I am a bit limited in what antihypertensives I can offer her. - I will start by increasing her nifedipine  to 90 mg once a day and see how that works.  I have room to go up on her carvedilol as well. Increase nifedipine  to 90 mg daily Follow-up in 1 month for HTN

## 2024-04-16 NOTE — Patient Instructions (Signed)
 Medication Instructions:  START Aspirin  81 mg   INCREASE Nifedipine  to 90 mg daily   *If you need a refill on your cardiac medications before your next appointment, please call your pharmacy*  CHECK BLOOD PRESSURE TWICE DAILY FOR 2 WEEKS AND PLEASE ADVISE OFFICE OF RESULTS WHEN COMPLETED  Blood Pressure Record Sheet To take your blood pressure, you will need a blood pressure machine. You can buy a blood pressure machine (blood pressure monitor) at your clinic, drug store, or online. When choosing one, consider: An automatic monitor that has an arm cuff. A cuff that wraps snugly around your upper arm. You should be able to fit only one finger between your arm and the cuff. A device that stores blood pressure reading results. Do not choose a monitor that measures your blood pressure from your wrist or finger. Follow your health care provider's instructions for how to take your blood pressure. To use this form: Take your blood pressure medications every day These measurements should be taken when you have been at rest for at least 10-15 min Take at least 2 readings with each blood pressure check. This makes sure the results are correct. Wait 1-2 minutes between measurements. Write down the results in the spaces on this form. Keep in mind it should always be recorded systolic over diastolic. Both numbers are important.  Repeat this every day for 2-3 weeks, or as told by your health care provider.  Make a follow-up appointment with your health care provider to discuss the results.  Blood Pressure Log Date Medications taken? (Y/N) Blood Pressure Time of Day                                                                                                         Testing/Procedures: Echocardiogram  Your physician has requested that you have an echocardiogram. Echocardiography is a painless test that uses sound waves to create images of your heart. It provides your doctor  with information about the size and shape of your heart and how well your heart's chambers and valves are working. This procedure takes approximately one hour. There are no restrictions for this procedure. Please do NOT wear cologne, perfume, aftershave, or lotions (deodorant is allowed). Please arrive 15 minutes prior to your appointment time.  Please note: We ask at that you not bring children with you during ultrasound (echo/ vascular) testing. Due to room size and safety concerns, children are not allowed in the ultrasound rooms during exams. Our front office staff cannot provide observation of children in our lobby area while testing is being conducted. An adult accompanying a patient to their appointment will only be allowed in the ultrasound room at the discretion of the ultrasound technician under special circumstances. We apologize for any inconvenience.   Follow-Up: At [facility], you and your health needs are our priority.  As part of our continuing mission to provide you with exceptional heart care, our providers are all part of one team.  This team includes your primary Cardiologist (physician) and Advanced Practice Providers or APPs (Physician  Assistants and Nurse Practitioners) who all work together to provide you with the care you need, when you need it.  Your next appointment:   1 month(s)  Provider:   Georganna Archer, MD

## 2024-04-16 NOTE — Telephone Encounter (Signed)
Received cardiology clearance

## 2024-04-18 ENCOUNTER — Telehealth: Payer: Self-pay | Admitting: *Deleted

## 2024-04-18 NOTE — Patient Instructions (Addendum)
 SURGICAL WAITING ROOM VISITATION Patients having surgery or a procedure may have no more than 2 support people in the waiting area - these visitors may rotate in the visitor waiting room.   If the patient needs to stay at the hospital during part of their recovery, the visitor guidelines for inpatient rooms apply.  PRE-OP VISITATION  Pre-op nurse will coordinate an appropriate time for 1 support person to accompany the patient in pre-op.  This support person may not rotate.  This visitor will be contacted when the time is appropriate for the visitor to come back in the pre-op area.  Temporary Visitor Restrictions   Children ages 33 and under will not be able to visit patients in [facility] under most circumstances. Visitation is not restricted outside of hospitals unless noted otherwise in the [facility] and Location Specific Visitation Guidelines at :       [email or web address]  Visitors with respiratory illnesses are discouraged from visiting and should remain at home.  You are not required to quarantine at this time prior to your surgery. However, you must do this: Hand Hygiene often Do NOT share personal items Notify your provider if you are in close contact with someone who has COVID or you develop fever 100.4 or greater, new onset of sneezing, cough, sore throat, shortness of breath or body aches.  If you test positive for Covid or have been in contact with anyone that has tested positive in the last 10 days please notify you surgeon.    Your procedure is scheduled on:  TUESDAY  04-24-2024  Report to [facility] Main Entrance: [address] entrance where the [employer] is available.   Report to admitting at:  05:15   AM  Call this number if you have any questions or problems the morning of surgery [phone]  FOLLOW ANY ADDITIONAL PRE OP INSTRUCTIONS YOU RECEIVED FROM YOUR SURGEON'S OFFICE!!!  Eat a light diet the day before surgery.  Examples including soups,  broths, toast, yogurt, mashed potatoes.  AVOID GAS PRODUCING FOODS. Things to avoid include carbonated beverages (fizzy beverages, sodas), raw fruits and raw vegetables (uncooked), or beans.   Do not eat food after Midnight the night prior to your surgery/procedure.  After Midnight you may have the following liquids until  04:30 AM DAY OF SURGERY  Clear Liquid Diet Water  Black Coffee (sugar ok, NO MILK/CREAM OR CREAMERS)  Tea (sugar ok, NO MILK/CREAM OR CREAMERS) regular and decaf                             Plain Jell-O  with no fruit (NO RED)                                           Fruit ices (not with fruit pulp, NO RED)                                     Popsicles (NO RED)  Juice: NO CITRUS JUICES: only apple, WHITE grape, WHITE cranberry Sports drinks like Gatorade or Powerade (NO RED)                Oral Hygiene is also important to reduce your risk of infection.        Remember - BRUSH YOUR TEETH THE MORNING OF SURGERY WITH YOUR REGULAR TOOTHPASTE  Do NOT smoke after Midnight the night before surgery.  STOP TAKING all Vitamins, Herbs and supplements 1 week before your surgery.   ASPIRIN  81 mg-    Take ONLY these medicines the morning of surgery with A SIP OF WATER : Nifedipine  (Adalat ), Carvedilol, Tacrolimus  (Prograf ), Azathioprine (Imuran), prednisone ,  Eye drops,   DO NOT TAKE Torsemide on the morning of your surgery                   You may not have any metal on your body including hair pins, jewelry, and body piercing  Do not wear make-up, lotions, powders, perfumes or deodorant  Do not wear nail polish including gel and S&S, artificial / acrylic nails, or any other type of covering on natural nails including finger and toenails. If you have artificial nails, gel coating, etc., that needs to be removed by a nail salon, Please have this removed prior to surgery. Not doing so may mean that your surgery  could be cancelled or delayed if the Surgeon or anesthesia staff feels like they are unable to monitor you safely.   Do not shave 48 hours prior to surgery to avoid nicks in your skin which may contribute to postoperative infections.   Contacts, Hearing Aids, dentures or bridgework may not be worn into surgery. DENTURES WILL BE REMOVED PRIOR TO SURGERY PLEASE DO NOT APPLY Poly grip OR ADHESIVES!!!  You may bring a small overnight bag with you on the day of surgery, only pack items that are not valuable. [facility] IS NOT RESPONSIBLE   FOR VALUABLES THAT ARE LOST OR STOLEN.   Patients discharged on the day of surgery will not be allowed to drive home.  Someone NEEDS to stay with you for the first 24 hours after anesthesia.  Do not bring your home medications to the hospital. The Pharmacy will dispense medications listed on your medication list to you during your admission in the Hospital.  Special Instructions: Bring a copy of your healthcare power of attorney and living will documents the day of surgery, if you wish to have them scanned into your [place] [facility] Records- EPIC  Please read over the following fact sheets you were given: IF YOU HAVE QUESTIONS ABOUT YOUR PRE-OP INSTRUCTIONS, PLEASE CALL [phone]   [facility] - Preparing for Surgery        Before surgery, you can play an important role.  Because skin is not sterile, your skin needs to be as free of germs as possible.  You can reduce the number of germs on your skin by washing with CHG (chlorahexidine gluconate) soap before surgery.  CHG is an antiseptic cleaner which kills germs and bonds with the skin to continue killing germs even after washing. Please DO NOT use if you have an allergy to CHG or antibacterial soaps.  If your skin becomes reddened/irritated stop using the CHG and inform your nurse when you arrive at Short Stay. Do not shave (including legs and underarms) for at least 48 hours prior to the first CHG shower.   You may shave your face/neck.  Please follow these instructions  carefully:  1.  Shower with CHG Soap the night before surgery ONLY (DO NOT USE THE CHG SOAP THE MORNING OF SURGERY).  2.  If you choose to wash your hair, wash your hair first as usual with your normal  shampoo.  3.  After you shampoo, rinse your hair and body thoroughly to remove the shampoo.                             4.  Use CHG as you would any other liquid soap.  You can apply chg directly to the skin and wash.  Gently with a scrungie or clean washcloth.  5.  Apply the CHG Soap to your body ONLY FROM THE NECK DOWN.   Do not use on face/ open                           Wound or open sores. Avoid contact with eyes, ears mouth and genitals (private parts).                       Wash face,  Genitals (private parts) with your normal soap.             6.  Wash thoroughly, paying special attention to the area where your  surgery  will be performed.  7.  Thoroughly rinse your body with warm water  from the neck down.  8.  DO NOT shower/wash with your normal soap after using and rinsing off the CHG Soap.                9.  Pat yourself dry with a clean towel.            10.  Wear clean pajamas.            11.  Place clean sheets on your bed the night of your first shower and do not  sleep with pets.  Day of Surgery : Do not apply any CHG, lotions/deodorants the morning of surgery.  Please wear clean clothes to the hospital/surgery center.   FAILURE TO FOLLOW THESE INSTRUCTIONS MAY RESULT IN THE CANCELLATION OF YOUR SURGERY  PATIENT SIGNATURE_________________________________  NURSE SIGNATURE__________________________________  ________________________________________________________________________

## 2024-04-18 NOTE — Progress Notes (Signed)
 COVID Vaccine received:  []  No [x]  Yes Date of any COVID positive Test in last 90 days:  none  PCP - Catheryn Slocumb, MD  Cardiologist - Georganna Archer, MD cardiac clearance in 04-16-24 note even without new ECHO (scheduled for 05-18-24)  Nephrologist- Bernardino Gasman, MD   [facility] Transplant Team- Emery Portland, MD [place], [place]  [phone]  Endocrinology- Donnice Havens, MD  at [facility]  Chest x-ray - 06-19-2021  2v  Epic EKG - 04-16-24  Epic  Stress Test -  ECHO - scheduled for 05-17-2024 Cardiac Cath - 03-23-2016  LHC / CORS by Dr. Ladona  CT Coronary Calcium  score:   Pacemaker / ICD device [x]  No []  Yes   Spinal Cord Stimulator:[x]  No []  Yes       History of Sleep Apnea? [x]  No []  Yes   CPAP used?- [x]  No []  Yes    Medication on DOS: Nifedipine  (Adalat ), Carvedilol, Tacrolimus  (Prograf ), Azathioprine (Imuran), prednisone ,  Eye drops,   Hold IND:Unmdzfpiz  Patient has: []  NO Hx DM   []  Pre-DM   []  DM1  [x]   DM2 Does the patient monitor blood sugar?   []  N/A   []  No [x]  Yes  Last A1c was:  6.6  on  03-01-24 per Nephrology scanned note in Media    Does patient have a Freestyle [place] or Dexcom? []  No [x]  Yes    MOUNJARO  - injects Wednesday   last 04-11-2024 Insulin  glargine (Lantus )  Insulin  Lispro (Humalog ) SS   Blood Thinner / Instructions:  none Aspirin  Instructions:  ASA 81mg  has only taken x 2 days so she will stop  Activity level: Able to walk up 2 flights of stairs without becoming significantly short of breath or having chest pain?  []  No   [x]    Yes  Patient can perform ADLs without assistance. []  No   [x]   Yes  Comments: Patient is a Consulting Civil Engineer for [employer].   Anesthesia review: HTN, Heart murmur (II/VI systolic murmur heard loudest at LLSB)- ECHO scheduled for 05-18-24, uncontrolled DM2, ESRD s/p renal transplant  (03-28-2020 at [facility]) now with CKD4 in the new failing Transplant, anemia, Diabetic  retinopathy  Patient denies any S&S of respiratory illness or Covid - no shortness of breath, fever, cough or chest pain at PAT appointment.  Patient verbalized understanding and agreement to the Pre-Surgical Instructions that were given to them at this PAT appointment. Patient was also educated of the need to review these PAT instructions again prior to her surgery.I reviewed the appropriate phone numbers to call if they have any and questions or concerns.

## 2024-04-18 NOTE — Telephone Encounter (Signed)
 Samantha [name] called regarding her surgery on 12/16. Originally she did not want to have her ovaries removed during surgery. She called the office today to state that she has changed her mind and her plan is to have her ovaries removed while she is having her hysterectomy.

## 2024-04-19 ENCOUNTER — Other Ambulatory Visit: Payer: Self-pay

## 2024-04-19 ENCOUNTER — Encounter (HOSPITAL_COMMUNITY): Payer: Self-pay

## 2024-04-19 ENCOUNTER — Encounter (HOSPITAL_COMMUNITY)
Admission: RE | Admit: 2024-04-19 | Discharge: 2024-04-19 | Disposition: A | Source: Ambulatory Visit | Attending: Psychiatry | Admitting: Psychiatry

## 2024-04-19 VITALS — BP 160/65 | HR 68 | Temp 98.8°F | Resp 20 | Ht 67.75 in | Wt 240.0 lb

## 2024-04-19 DIAGNOSIS — Z01818 Encounter for other preprocedural examination: Secondary | ICD-10-CM

## 2024-04-19 DIAGNOSIS — N289 Disorder of kidney and ureter, unspecified: Secondary | ICD-10-CM

## 2024-04-19 DIAGNOSIS — Z794 Long term (current) use of insulin: Secondary | ICD-10-CM

## 2024-04-19 DIAGNOSIS — N879 Dysplasia of cervix uteri, unspecified: Secondary | ICD-10-CM

## 2024-04-19 DIAGNOSIS — Z79899 Other long term (current) drug therapy: Secondary | ICD-10-CM

## 2024-04-19 LAB — COMPREHENSIVE METABOLIC PANEL WITH GFR
ALT: 10 U/L (ref 0–44)
AST: 16 U/L (ref 15–41)
Albumin: 3.8 g/dL (ref 3.5–5.0)
Alkaline Phosphatase: 104 U/L (ref 38–126)
Anion gap: 11 (ref 5–15)
BUN: 46 mg/dL — ABNORMAL HIGH (ref 6–20)
CO2: 21 mmol/L — ABNORMAL LOW (ref 22–32)
Calcium: 9 mg/dL (ref 8.9–10.3)
Chloride: 108 mmol/L (ref 98–111)
Creatinine, Ser: 2.52 mg/dL — ABNORMAL HIGH (ref 0.44–1.00)
GFR, Estimated: 22 mL/min — ABNORMAL LOW (ref >60)
Glucose, Bld: 208 mg/dL — ABNORMAL HIGH (ref 70–99)
Potassium: 4.4 mmol/L (ref 3.5–5.1)
Sodium: 140 mmol/L (ref 135–145)
Total Bilirubin: 0.3 mg/dL (ref 0.0–1.2)
Total Protein: 8 g/dL (ref 6.5–8.1)

## 2024-04-19 LAB — CBC
HCT: 38.5 % (ref 36.0–46.0)
Hemoglobin: 11.8 g/dL — ABNORMAL LOW (ref 12.0–15.0)
MCH: 32.4 pg (ref 26.0–34.0)
MCHC: 30.6 g/dL (ref 30.0–36.0)
MCV: 105.8 fL — ABNORMAL HIGH (ref 80.0–100.0)
Platelets: 276 K/uL (ref 150–400)
RBC: 3.64 MIL/uL — ABNORMAL LOW (ref 3.87–5.11)
RDW: 15.4 % (ref 11.5–15.5)
WBC: 8 K/uL (ref 4.0–10.5)
nRBC: 0 % (ref 0.0–0.2)

## 2024-04-19 LAB — GLUCOSE, CAPILLARY: Glucose-Capillary: 214 mg/dL — ABNORMAL HIGH (ref 70–99)

## 2024-04-20 ENCOUNTER — Encounter (HOSPITAL_COMMUNITY): Payer: Self-pay

## 2024-04-20 ENCOUNTER — Telehealth: Payer: Self-pay | Admitting: *Deleted

## 2024-04-20 NOTE — Anesthesia Preprocedure Evaluation (Signed)
 Anesthesia Evaluation  Patient identified by MRN, date of birth, ID band Patient awake    Reviewed: Allergy & Precautions, NPO status , Patient's Chart, lab work & pertinent test results  Airway Mallampati: III  TM Distance: >3 FB Neck ROM: Full    Dental  (+) Teeth Intact, Dental Advisory Given   Pulmonary  Snores at night, no sleep study, no witnessed apneas   Pulmonary exam normal breath sounds clear to auscultation       Cardiovascular hypertension (140/63 preop), Pt. on medications Normal cardiovascular exam Rhythm:Regular Rate:Normal   seen by Cardiology on 04/16/24 for pre op clearance. She has hx of abnormal NM stress test in 2016 and underwent LHC in 2017 that showed normal coronaries. Echo was ordered for heart murmur auscultated on exam. Of note she had an echo as part of pre op w/u for transplant which showed normal LVEF, aortic valve sclerosis, mild TR (2021). Patient was cleared for surgery  Has repeat echo scheduled for Jan 2026   Neuro/Psych negative neurological ROS  negative psych ROS   GI/Hepatic negative GI ROS, Neg liver ROS,,,  Endo/Other  diabetes, Well Controlled, Type 2, Insulin  Dependent  BMI 37 A1c 6.6 Insulin  10 units lantus  this AM (20units is normal dose), did not take PM dose last night FS 160 in preop   Renal/GU CRF and ESRFRenal disease (s/p kidney transplant 2021)Baseline Cr 2.8, Cr 2.5 today  negative genitourinary   Musculoskeletal negative musculoskeletal ROS (+)    Abdominal  (+) + obese  Peds  Hematology negative hematology ROS (+) Hb 11.6   Anesthesia Other Findings Mounjaro  LD: 12/2 (>7d)  Reproductive/Obstetrics negative OB ROS                              Anesthesia Physical Anesthesia Plan  ASA: 3  Anesthesia Plan: General   Post-op Pain Management: Tylenol  PO (pre-op)*, Ketamine  IV* and Dilaudid  IV   Induction: Intravenous  PONV Risk  Score and Plan: 4 or greater and Ondansetron , Dexamethasone , Midazolam  and Treatment may vary due to age or medical condition  Airway Management Planned: Oral ETT  Additional Equipment: None  Intra-op Plan:   Post-operative Plan: Extubation in OR  Informed Consent: I have reviewed the patients History and Physical, chart, labs and discussed the procedure including the risks, benefits and alternatives for the proposed anesthesia with the patient or authorized representative who has indicated his/her understanding and acceptance.     Dental advisory given  Plan Discussed with: CRNA  Anesthesia Plan Comments: (2 PIVs)         Anesthesia Quick Evaluation

## 2024-04-20 NOTE — Telephone Encounter (Signed)
 Left message no answer

## 2024-04-20 NOTE — Telephone Encounter (Signed)
 Received cardio clearance

## 2024-04-20 NOTE — Telephone Encounter (Signed)
 I spoke with Ms. Qazi and reviewed her glucose level from 12/11. She was advise to avoid sugary, simple carbs to help keep her glucose controlled to assist with healing after surgery and decrease risk of infection, complications. She is agreeable with the plan of care.

## 2024-04-20 NOTE — Progress Notes (Signed)
 Case: [id number] Date/Time: 04/24/24 0715   Procedures:      HYSTERECTOMY, TOTAL, ROBOT-ASSISTED, LAPAROSCOPIC, WITH BILATERAL SALPINGO-OOPHORECTOMY (Bilateral) - with BSO     DESTRUCTION, LESION, GENITALIA - possible co2 laser of vulva   Anesthesia type: General   Pre-op diagnosis:      N87.9 CERVICAL DYSPLASIA     VULVAR DYSPLASIA   [facility] ROOM 05 / [facility]   Surgeons: Eldonna Mays, MD       DISCUSSION: Samantha [name] is a 58 yo female with PMH of HTN, ESRD s/p kidney transplant (2021), IDDM (A1c 6.6 in Oct 2025), anemia.  Patient with hx of ESRD and is s/p kidney transplant in 03/[facility] at [facility]. Last seen on 12/15/23 by transplant team. Hx of CMV viremia. She is on Tacrolimus , Imuran, Prednisone .   Patient follows with Nephrology for ESRD. Last seen on 03/20/24. Creatinine baseline is around 2.8 in stage 4 range. She was advised to reach out to transplant team to see if she would be eligible for another kidney transplant.  Patient seen by Cardiology on 04/16/24 for pre op clearance. She has hx of abnormal NM stress test in 2016 and underwent LHC in 2017 that showed normal coronaries. Echo was ordered for heart murmur auscultated on exam. Of note she had an echo as part of pre op w/u for transplant which showed normal LVEF, aortic valve sclerosis, mild TR. Patient was cleared for surgery:  Preoperative cardiovascular examination - Patient presents for preoperative cardiovascular risk assessment prior to a total hysterectomy. -Her RCRI = 2 which places her perioperative risk for MACE = 6.6%.  The patient is able to perform >4 METs without limitation, so no additional cardiac evaluation prior to surgery is warranted at this time.   VS: BP (!) 160/65 Comment: right arm sitting  Pulse 68   Temp 37.1 C (Oral)   Resp 20   Ht 5' 7.75 (1.721 m)   Wt 108.9 kg   LMP  (LMP Unknown)   SpO2 100%   BMI 36.76 kg/m   PROVIDERS: Jarold Medici, MD   LABS: Labs reviewed:  Acceptable for surgery. (all labs ordered are listed, but only abnormal results are displayed)  Labs Reviewed  CBC - Abnormal; Notable for the following components:      Result Value   RBC 3.64 (*)    Hemoglobin 11.8 (*)    MCV 105.8 (*)    All other components within normal limits  COMPREHENSIVE METABOLIC PANEL WITH GFR - Abnormal; Notable for the following components:   CO2 21 (*)    Glucose, Bld 208 (*)    BUN 46 (*)    Creatinine, Ser 2.52 (*)    GFR, Estimated 22 (*)    All other components within normal limits  GLUCOSE, CAPILLARY - Abnormal; Notable for the following components:   Glucose-Capillary 214 (*)    All other components within normal limits  TYPE AND SCREEN     IMAGES:   EKG 04/16/2024:  Normal sinus rhythm Left ventricular hypertrophy with repolarization abnormality   Echo 09/17/2019 ([facility]):  SUMMARY The left ventricular size is normal. There is mild concentric left ventricular hypertrophy. Left ventricular systolic function is normal. LV ejection fraction = 60-65%. Left ventricular filling pattern is prolonged relaxation. The left ventricular wall motion is normal. The right ventricle is normal in size and function. The left atrium is mildly dilated. There is aortic valve sclerosis. There is mild tricuspid regurgitation. The IVC is normal in size with an inspiratory  collapse of greater then 50%, suggesting normal right atrial pressure. There is trivial pericardial effusion. There is no significant change in comparison with the last study.  NM stress test 09/17/2019 ([facility]):  Impression  1.  No inducible ischemia. 2.  Normal left ventricular function.  LHC 03/23/2016:  LV end diastolic pressure is mildly elevated at 19 mm Hg. No LV gram performed to preserve contrast. Normal coronary arteries, right dominant. 15 mL contrast used. Patient's findings are consistent with nonischemic hypertensive heart disease.   Patient be discharged home  today, with outpatient follow-up. Needs better control of blood pressure.  Past Medical History:  Diagnosis Date   Anemia in chronic kidney disease (CKD)    Chronic kidney disease, stage V (HCC)    s/p kidney transplant 03/28/2020   Diabetic retinopathy (HCC)    Heart murmur    has ECHO scheduled for 05-18-24   Hx of goiter 2009   had thyroidectomy  on no medication for replacement   Hypertension    Kidney transplanted    Uncontrolled diabetes mellitus with microalbuminuria or microproteinuria     Past Surgical History:  Procedure Laterality Date   CARDIAC CATHETERIZATION N/A 03/23/2016   Procedure: Left Heart Cath and Coronary Angiography;  Surgeon: Gordy Bergamo, MD;  [facility] INVASIVE CV LAB;  Service: Cardiovascular;  Laterality: N/A;   CATARACT EXTRACTION W/ INTRAOCULAR LENS IMPLANT Bilateral    CERVICAL CONIZATION W/BX N/A 11/29/2023   Procedure: CONE BIOPSY, CERVIX;  Surgeon: Lilton Legions, DO;  Location: [facility] OR;  Service: Gynecology;  Laterality: N/A;   CESAREAN SECTION  2005   CESAREAN SECTION  1993   DILITATION & CURRETTAGE/HYSTROSCOPY WITH NOVASURE ABLATION N/A 10/24/2014   Procedure: DILATATION & CURETTAGE/HYSTEROSCOPY WITH NOVASURE ABLATION;  Surgeon: Gigi Botts, MD;  Location: [facility]S;  Service: Gynecology;  Laterality: N/A;   KIDNEY TRANSPLANT  03/28/2020   [facility]   LEEP N/A 09/02/2023   Procedure: LEEP (LOOP ELECTROSURGICAL EXCISION PROCEDURE);  Surgeon: Lilton Legions, DO;  Location: [facility] OR;  Service: Gynecology;  Laterality: N/A;   THYROIDECTOMY  2009   partial   TUBAL LIGATION  2005    MEDICATIONS:  aspirin  EC 81 MG tablet   atorvastatin  (LIPITOR) 80 MG tablet   azaTHIOprine (IMURAN) 50 MG tablet   Azathioprine 75 MG TABS   azelastine  (OPTIVAR ) 0.05 % ophthalmic solution   carvedilol (COREG) 6.25 MG tablet   Continuous Glucose Receiver (DEXCOM G7 RECEIVER) DEVI   imiquimod  (ALDARA ) 5 % cream   insulin  glargine (LANTUS  SOLOSTAR) 100 UNIT/ML  Solostar Pen   insulin  lispro (HUMALOG  KWIKPEN) 100 UNIT/ML KwikPen   lidocaine  (XYLOCAINE ) 5 % ointment   MOUNJARO  12.5 MG/0.5ML Pen   NIFEdipine  (ADALAT  CC) 90 MG 24 hr tablet   oxyCODONE  (OXY IR/ROXICODONE ) 5 MG immediate release tablet   predniSONE  (DELTASONE ) 5 MG tablet   senna-docusate (SENOKOT-S) 8.6-50 MG tablet   sulfamethoxazole-trimethoprim (BACTRIM) 400-80 MG tablet   tacrolimus  (PROGRAF ) 1 MG capsule   torsemide (DEMADEX) 20 MG tablet   No current facility-administered medications for this encounter.   Burnard CHRISTELLA Odis DEVONNA [facility] Surgical Short Stay/Anesthesiology [facility] Phone [phone] 04/20/2024 11:00 AM

## 2024-04-23 ENCOUNTER — Telehealth: Payer: Self-pay | Admitting: *Deleted

## 2024-04-23 NOTE — Telephone Encounter (Signed)
 2nd attempt to reach patient for pre-op call. Left voicemail requesting call back to [phone].

## 2024-04-23 NOTE — Telephone Encounter (Signed)
 Attempted to reach patient for pre-op call. Left voicemail requesting call back.

## 2024-04-23 NOTE — Telephone Encounter (Signed)
 Telephone call to check on pre-operative status.  Patient compliant with pre-operative instructions.  Reinforced nothing to eat after midnight. Clear liquids until 0415. Patient to arrive at 0515.  No questions or concerns voiced.  Instructed to call for any needs.

## 2024-04-24 ENCOUNTER — Encounter (HOSPITAL_COMMUNITY): Payer: Self-pay | Admitting: Psychiatry

## 2024-04-24 ENCOUNTER — Ambulatory Visit (HOSPITAL_COMMUNITY)
Admission: RE | Admit: 2024-04-24 | Discharge: 2024-04-24 | Disposition: A | Attending: Psychiatry | Admitting: Psychiatry

## 2024-04-24 ENCOUNTER — Ambulatory Visit (HOSPITAL_COMMUNITY): Payer: Self-pay | Admitting: Anesthesiology

## 2024-04-24 ENCOUNTER — Encounter: Admission: RE | Disposition: A | Payer: Self-pay | Attending: Psychiatry

## 2024-04-24 ENCOUNTER — Encounter (HOSPITAL_COMMUNITY): Payer: Self-pay | Admitting: Medical

## 2024-04-24 DIAGNOSIS — N184 Chronic kidney disease, stage 4 (severe): Secondary | ICD-10-CM | POA: Diagnosis not present

## 2024-04-24 DIAGNOSIS — Z01818 Encounter for other preprocedural examination: Secondary | ICD-10-CM

## 2024-04-24 DIAGNOSIS — E1121 Type 2 diabetes mellitus with diabetic nephropathy: Secondary | ICD-10-CM

## 2024-04-24 DIAGNOSIS — D069 Carcinoma in situ of cervix, unspecified: Secondary | ICD-10-CM | POA: Diagnosis not present

## 2024-04-24 DIAGNOSIS — D071 Carcinoma in situ of vulva: Secondary | ICD-10-CM | POA: Diagnosis not present

## 2024-04-24 DIAGNOSIS — N879 Dysplasia of cervix uteri, unspecified: Secondary | ICD-10-CM

## 2024-04-24 DIAGNOSIS — Z794 Long term (current) use of insulin: Secondary | ICD-10-CM | POA: Diagnosis not present

## 2024-04-24 DIAGNOSIS — N871 Moderate cervical dysplasia: Secondary | ICD-10-CM | POA: Diagnosis present

## 2024-04-24 DIAGNOSIS — I120 Hypertensive chronic kidney disease with stage 5 chronic kidney disease or end stage renal disease: Secondary | ICD-10-CM | POA: Diagnosis not present

## 2024-04-24 DIAGNOSIS — I129 Hypertensive chronic kidney disease with stage 1 through stage 4 chronic kidney disease, or unspecified chronic kidney disease: Secondary | ICD-10-CM | POA: Diagnosis not present

## 2024-04-24 DIAGNOSIS — N185 Chronic kidney disease, stage 5: Secondary | ICD-10-CM | POA: Diagnosis not present

## 2024-04-24 DIAGNOSIS — D259 Leiomyoma of uterus, unspecified: Secondary | ICD-10-CM | POA: Diagnosis not present

## 2024-04-24 DIAGNOSIS — Z940 Kidney transplant status: Secondary | ICD-10-CM

## 2024-04-24 DIAGNOSIS — R87613 High grade squamous intraepithelial lesion on cytologic smear of cervix (HGSIL): Secondary | ICD-10-CM | POA: Diagnosis not present

## 2024-04-24 DIAGNOSIS — E1122 Type 2 diabetes mellitus with diabetic chronic kidney disease: Secondary | ICD-10-CM | POA: Diagnosis not present

## 2024-04-24 HISTORY — PX: LESION DESTRUCTION: SHX5132

## 2024-04-24 HISTORY — PX: ROBOTIC ASSISTED TOTAL HYSTERECTOMY WITH BILATERAL SALPINGO OOPHERECTOMY: SHX6086

## 2024-04-24 LAB — TYPE AND SCREEN
ABO/RH(D): O POS
Antibody Screen: NEGATIVE

## 2024-04-24 LAB — GLUCOSE, CAPILLARY
Glucose-Capillary: 160 mg/dL — ABNORMAL HIGH (ref 70–99)
Glucose-Capillary: 175 mg/dL — ABNORMAL HIGH (ref 70–99)
Glucose-Capillary: 204 mg/dL — ABNORMAL HIGH (ref 70–99)
Glucose-Capillary: 230 mg/dL — ABNORMAL HIGH (ref 70–99)
Glucose-Capillary: 174 mg/dL — ABNORMAL HIGH (ref 70–99)

## 2024-04-24 SURGERY — HYSTERECTOMY, TOTAL, ROBOT-ASSISTED, LAPAROSCOPIC, WITH BILATERAL SALPINGO-OOPHORECTOMY
Anesthesia: General

## 2024-04-24 MED ORDER — FENTANYL CITRATE (PF) 100 MCG/2ML IJ SOLN
INTRAMUSCULAR | Status: DC | PRN
  Administered 2024-04-24: 08:00:00 100 ug via INTRAVENOUS

## 2024-04-24 MED ORDER — HEPARIN SODIUM (PORCINE) 5000 UNIT/ML IJ SOLN
5000.0000 [IU] | INTRAMUSCULAR | Status: AC
  Administered 2024-04-24: 07:00:00 5000 [IU] via SUBCUTANEOUS
  Filled 2024-04-24: qty 1

## 2024-04-24 MED ORDER — STERILE WATER FOR IRRIGATION IR SOLN
Status: DC | PRN
  Administered 2024-04-24: 09:00:00 1000 mL

## 2024-04-24 MED ORDER — CHLORHEXIDINE GLUCONATE 0.12 % MT SOLN
15.0000 mL | Freq: Once | OROMUCOSAL | Status: AC
  Administered 2024-04-24: 07:00:00 15 mL via OROMUCOSAL

## 2024-04-24 MED ORDER — HYDROMORPHONE HCL 1 MG/ML IJ SOLN
INTRAMUSCULAR | Status: DC | PRN
  Administered 2024-04-24 (×2): 1 mg via INTRAVENOUS

## 2024-04-24 MED ORDER — MIDAZOLAM HCL 5 MG/5ML IJ SOLN
INTRAMUSCULAR | Status: DC | PRN
  Administered 2024-04-24 (×2): 1 mg via INTRAVENOUS

## 2024-04-24 MED ORDER — PHENYLEPHRINE HCL (PRESSORS) 10 MG/ML IV SOLN
INTRAVENOUS | Status: AC
  Filled 2024-04-24: qty 1

## 2024-04-24 MED ORDER — MIDAZOLAM HCL 2 MG/2ML IJ SOLN
INTRAMUSCULAR | Status: AC
  Filled 2024-04-24: qty 2

## 2024-04-24 MED ORDER — HYDROMORPHONE HCL 1 MG/ML IJ SOLN: 0.2500 mg | INTRAMUSCULAR | Status: DC | PRN

## 2024-04-24 MED ORDER — LIDOCAINE HCL 2 % IJ SOLN
INTRAMUSCULAR | Status: AC
  Filled 2024-04-24: qty 20

## 2024-04-24 MED ORDER — ONDANSETRON HCL 4 MG/2ML IJ SOLN
INTRAMUSCULAR | Status: DC | PRN
  Administered 2024-04-24: 08:00:00 4 mg via INTRAVENOUS

## 2024-04-24 MED ORDER — BUPIVACAINE HCL (PF) 0.25 % IJ SOLN
INTRAMUSCULAR | Status: AC
  Filled 2024-04-24: qty 30

## 2024-04-24 MED ORDER — PROMETHAZINE (PHENERGAN) 6.25MG IN NS 50ML IVPB
6.2500 mg | INTRAVENOUS | Status: DC | PRN
  Filled 2024-04-24: qty 50

## 2024-04-24 MED ORDER — SCOPOLAMINE 1 MG/3DAYS TD PT72
1.0000 | MEDICATED_PATCH | TRANSDERMAL | Status: DC
  Administered 2024-04-24: 15:00:00 1 mg via TRANSDERMAL

## 2024-04-24 MED ORDER — OXYCODONE HCL 5 MG/5ML PO SOLN: 5.0000 mg | Freq: Once | ORAL | Status: DC | PRN

## 2024-04-24 MED ORDER — INSULIN ASPART 100 UNIT/ML IJ SOLN
4.0000 [IU] | Freq: Once | INTRAMUSCULAR | Status: AC
  Administered 2024-04-24: 11:00:00 4 [IU] via SUBCUTANEOUS

## 2024-04-24 MED ORDER — ALBUTEROL SULFATE HFA 108 (90 BASE) MCG/ACT IN AERS
INHALATION_SPRAY | RESPIRATORY_TRACT | Status: DC | PRN
  Administered 2024-04-24 (×2): 4 via RESPIRATORY_TRACT
  Administered 2024-04-24: 09:00:00 8 via RESPIRATORY_TRACT

## 2024-04-24 MED ORDER — LIDOCAINE HCL (CARDIAC) PF 100 MG/5ML IV SOSY
PREFILLED_SYRINGE | INTRAVENOUS | Status: DC | PRN
  Administered 2024-04-24: 08:00:00 60 mg via INTRAVENOUS

## 2024-04-24 MED ORDER — SUGAMMADEX SODIUM 200 MG/2ML IV SOLN
INTRAVENOUS | Status: DC | PRN
  Administered 2024-04-24: 11:00:00 200 mg via INTRAVENOUS

## 2024-04-24 MED ORDER — INSULIN ASPART 100 UNIT/ML IJ SOLN
0.0000 [IU] | INTRAMUSCULAR | Status: AC | PRN
  Administered 2024-04-24: 09:00:00 4 [IU] via SUBCUTANEOUS
  Administered 2024-04-24: 11:00:00 8 [IU] via SUBCUTANEOUS

## 2024-04-24 MED ORDER — ROCURONIUM BROMIDE 100 MG/10ML IV SOLN
INTRAVENOUS | Status: DC | PRN
  Administered 2024-04-24: 08:00:00 100 mg via INTRAVENOUS
  Administered 2024-04-24: 09:00:00 40 mg via INTRAVENOUS

## 2024-04-24 MED ORDER — ONDANSETRON HCL 4 MG/2ML IJ SOLN
INTRAMUSCULAR | Status: AC
  Filled 2024-04-24: qty 2

## 2024-04-24 MED ORDER — ACETAMINOPHEN 500 MG PO TABS
1000.0000 mg | ORAL_TABLET | ORAL | Status: AC
  Administered 2024-04-24: 07:00:00 1000 mg via ORAL
  Filled 2024-04-24: qty 2

## 2024-04-24 MED ORDER — SODIUM CHLORIDE 0.9 % IV SOLN: INTRAVENOUS | Status: DC

## 2024-04-24 MED ORDER — PHENYLEPHRINE HCL-NACL 20-0.9 MG/250ML-% IV SOLN
INTRAVENOUS | Status: DC | PRN
  Administered 2024-04-24: 08:00:00 50 ug/min via INTRAVENOUS

## 2024-04-24 MED ORDER — PROPOFOL 1000 MG/100ML IV EMUL
INTRAVENOUS | Status: AC
  Filled 2024-04-24: qty 100

## 2024-04-24 MED ORDER — INSULIN ASPART 100 UNIT/ML IJ SOLN
INTRAMUSCULAR | Status: AC
  Filled 2024-04-24: qty 4

## 2024-04-24 MED ORDER — SILVER NITRATE-POT NITRATE 75-25 % EX MISC
CUTANEOUS | Status: AC
  Filled 2024-04-24: qty 10

## 2024-04-24 MED ORDER — ONDANSETRON HCL 4 MG PO TABS: 4.0000 mg | ORAL_TABLET | Freq: Three times a day (TID) | ORAL | 0 refills | Status: AC | PRN

## 2024-04-24 MED ORDER — SILVER SULFADIAZINE 1 % EX CREA
TOPICAL_CREAM | Freq: Every day | CUTANEOUS | Status: DC
  Filled 2024-04-24: qty 50

## 2024-04-24 MED ORDER — SUGAMMADEX SODIUM 200 MG/2ML IV SOLN
INTRAVENOUS | Status: AC
  Filled 2024-04-24: qty 2

## 2024-04-24 MED ORDER — BUPIVACAINE HCL 0.25 % IJ SOLN
INTRAMUSCULAR | Status: DC | PRN
  Administered 2024-04-24: 10:00:00 30 mL

## 2024-04-24 MED ORDER — LACTATED RINGERS IV SOLN: INTRAVENOUS | Status: DC | PRN

## 2024-04-24 MED ORDER — FENTANYL CITRATE (PF) 100 MCG/2ML IJ SOLN
INTRAMUSCULAR | Status: AC
  Filled 2024-04-24: qty 2

## 2024-04-24 MED ORDER — KETAMINE HCL 50 MG/5ML IJ SOSY
PREFILLED_SYRINGE | INTRAMUSCULAR | Status: AC
  Filled 2024-04-24: qty 5

## 2024-04-24 MED ORDER — KETAMINE HCL 50 MG/5ML IJ SOSY
PREFILLED_SYRINGE | INTRAMUSCULAR | Status: DC | PRN
  Administered 2024-04-24 (×2): 20 mg via INTRAVENOUS
  Administered 2024-04-24: 08:00:00 10 mg via INTRAVENOUS

## 2024-04-24 MED ORDER — LACTATED RINGERS IR SOLN
Status: DC | PRN
  Administered 2024-04-24: 09:00:00 1000 mL

## 2024-04-24 MED ORDER — PHENYLEPHRINE HCL (PRESSORS) 10 MG/ML IV SOLN
INTRAVENOUS | Status: DC | PRN
  Administered 2024-04-24: 08:00:00 160 ug via INTRAVENOUS
  Administered 2024-04-24 (×3): 80 ug via INTRAVENOUS

## 2024-04-24 MED ORDER — PROPOFOL 10 MG/ML IV BOLUS
INTRAVENOUS | Status: DC | PRN
  Administered 2024-04-24: 08:00:00 25 ug/kg/min via INTRAVENOUS
  Administered 2024-04-24: 08:00:00 200 mg via INTRAVENOUS

## 2024-04-24 MED ORDER — ROCURONIUM BROMIDE 10 MG/ML (PF) SYRINGE
PREFILLED_SYRINGE | INTRAVENOUS | Status: AC
  Filled 2024-04-24: qty 10

## 2024-04-24 MED ORDER — MEPIVACAINE HCL (PF) 2 % IJ SOLN
INTRAMUSCULAR | Status: DC | PRN
  Administered 2024-04-24: 09:00:00 3 mL via INTRATHECAL

## 2024-04-24 MED ORDER — SODIUM CHLORIDE 0.9 % IV SOLN
12.5000 mg | Freq: Once | INTRAVENOUS | Status: AC
  Administered 2024-04-24: 15:00:00 12.5 mg via INTRAVENOUS
  Filled 2024-04-24: qty 12.5

## 2024-04-24 MED ORDER — LACTATED RINGERS IV SOLN: INTRAVENOUS | Status: DC

## 2024-04-24 MED ORDER — METRONIDAZOLE 500 MG/100ML IV SOLN
500.0000 mg | INTRAVENOUS | Status: DC
  Filled 2024-04-24: qty 100

## 2024-04-24 MED ORDER — CEFAZOLIN SODIUM-DEXTROSE 2-4 GM/100ML-% IV SOLN
2.0000 g | INTRAVENOUS | Status: AC
  Administered 2024-04-24: 08:00:00 2 g via INTRAVENOUS
  Filled 2024-04-24: qty 100

## 2024-04-24 MED ORDER — ONDANSETRON HCL 4 MG/2ML IJ SOLN
4.0000 mg | Freq: Once | INTRAMUSCULAR | Status: AC | PRN
  Administered 2024-04-24: 14:00:00 4 mg via INTRAVENOUS

## 2024-04-24 MED ORDER — ORAL CARE MOUTH RINSE: 15.0000 mL | Freq: Once | OROMUCOSAL | Status: AC

## 2024-04-24 MED ORDER — AMISULPRIDE (ANTIEMETIC) 5 MG/2ML IV SOLN
10.0000 mg | Freq: Once | INTRAVENOUS | Status: AC | PRN
  Administered 2024-04-24: 13:00:00 10 mg via INTRAVENOUS

## 2024-04-24 MED ORDER — ACETAMINOPHEN 500 MG PO TABS: 1000.0000 mg | ORAL_TABLET | Freq: Once | ORAL | Status: DC

## 2024-04-24 MED ORDER — SCOPOLAMINE 1 MG/3DAYS TD PT72
MEDICATED_PATCH | TRANSDERMAL | Status: AC
  Filled 2024-04-24: qty 1

## 2024-04-24 MED ORDER — STERILE WATER FOR INJECTION IJ SOLN
INTRAMUSCULAR | Status: AC
  Filled 2024-04-24: qty 10

## 2024-04-24 MED ORDER — ACETIC ACID 5 % SOLN
Status: AC
  Filled 2024-04-24: qty 12

## 2024-04-24 MED ORDER — OXYCODONE HCL 5 MG PO TABS: 5.0000 mg | ORAL_TABLET | Freq: Once | ORAL | Status: DC | PRN

## 2024-04-24 MED ORDER — ACETIC ACID 5 % SOLN
Status: DC | PRN
  Administered 2024-04-24: 10:00:00 1 via TOPICAL

## 2024-04-24 MED ORDER — HYDROMORPHONE HCL 2 MG/ML IJ SOLN
INTRAMUSCULAR | Status: AC
  Filled 2024-04-24: qty 1

## 2024-04-24 MED ORDER — AMISULPRIDE (ANTIEMETIC) 5 MG/2ML IV SOLN
INTRAVENOUS | Status: AC
  Filled 2024-04-24: qty 4

## 2024-04-24 SURGICAL SUPPLY — 69 items
APPLICATOR SURGIFLO ENDO (HEMOSTASIS) IMPLANT
BAG LAPAROSCOPIC 12 15 PORT 16 (BASKET) IMPLANT
BLADE SURG SZ10 CARB STEEL (BLADE) IMPLANT
CHLORAPREP W/TINT 26 (MISCELLANEOUS) ×2 IMPLANT
COVER BACK TABLE 60X90IN (DRAPES) ×2 IMPLANT
COVER TIP SHEARS 8 DVNC (MISCELLANEOUS) ×2 IMPLANT
DERMABOND ADVANCED .7 DNX12 (GAUZE/BANDAGES/DRESSINGS) ×2 IMPLANT
DRAPE ARM DVNC X/XI (DISPOSABLE) ×8 IMPLANT
DRAPE COLUMN DVNC XI (DISPOSABLE) ×2 IMPLANT
DRAPE SHEET LG 3/4 BI-LAMINATE (DRAPES) ×2 IMPLANT
DRAPE SURG IRRIG POUCH 19X23 (DRAPES) ×2 IMPLANT
DRIVER NDL MEGA SUTCUT DVNCXI (INSTRUMENTS) ×2 IMPLANT
DRIVER NDLE MEGA SUTCUT DVNCXI (INSTRUMENTS) ×2 IMPLANT
DRSG OPSITE POSTOP 4X6 (GAUZE/BANDAGES/DRESSINGS) IMPLANT
DRSG OPSITE POSTOP 4X8 (GAUZE/BANDAGES/DRESSINGS) IMPLANT
ELECT PENCIL ROCKER SW 15FT (MISCELLANEOUS) IMPLANT
ELECT REM PT RETURN 15FT ADLT (MISCELLANEOUS) ×2 IMPLANT
FORCEPS BPLR FENES DVNC XI (FORCEP) ×2 IMPLANT
FORCEPS PROGRASP DVNC XI (FORCEP) ×2 IMPLANT
GAUZE 4X4 16PLY ~~LOC~~+RFID DBL (SPONGE) ×2 IMPLANT
GLOVE BIO SURGEON STRL SZ 6.5 (GLOVE) ×2 IMPLANT
GLOVE BIOGEL PI IND STRL 6.5 (GLOVE) ×4 IMPLANT
GLOVE BIOGEL PI MICRO STRL 6 (GLOVE) ×8 IMPLANT
GOWN STRL REUS W/ TWL LRG LVL3 (GOWN DISPOSABLE) ×8 IMPLANT
GRASPER SUT TROCAR 14GX15 (MISCELLANEOUS) IMPLANT
HOLDER FOLEY CATH W/STRAP (MISCELLANEOUS) IMPLANT
IRRIGATION SUCT STRKRFLW 2 WTP (MISCELLANEOUS) ×2 IMPLANT
KIT PROCEDURE DVNC SI (MISCELLANEOUS) IMPLANT
KIT TURNOVER KIT A (KITS) ×2 IMPLANT
LIGASURE IMPACT 36 18CM CVD LR (INSTRUMENTS) IMPLANT
MANIPULATOR ADVINCU DEL 3.0 PL (MISCELLANEOUS) IMPLANT
MANIPULATOR ADVINCU DEL 3.5 PL (MISCELLANEOUS) IMPLANT
MANIPULATOR UTERINE 4.5 ZUMI (MISCELLANEOUS) IMPLANT
NDL HYPO 21X1.5 SAFETY (NEEDLE) ×2 IMPLANT
NDL INSUFFLATION 14GA 120MM (NEEDLE) IMPLANT
NDL SPNL 20GX3.5 QUINCKE YW (NEEDLE) IMPLANT
NEEDLE HYPO 21X1.5 SAFETY (NEEDLE) ×2 IMPLANT
NEEDLE INSUFFLATION 14GA 120MM (NEEDLE) IMPLANT
NEEDLE SPNL 20GX3.5 QUINCKE YW (NEEDLE) IMPLANT
OBTURATOR OPTICALSTD 8 DVNC (TROCAR) ×2 IMPLANT
PACK ROBOT GYN CUSTOM WL (TRAY / TRAY PROCEDURE) ×2 IMPLANT
PAD ARMBOARD POSITIONER FOAM (MISCELLANEOUS) ×2 IMPLANT
PAD POSITIONING PINK XL (MISCELLANEOUS) ×2 IMPLANT
PORT ACCESS TROCAR AIRSEAL 12 (TROCAR) IMPLANT
SCISSORS LAP 5X45 EPIX DISP (ENDOMECHANICALS) IMPLANT
SCISSORS MNPLR CVD DVNC XI (INSTRUMENTS) ×2 IMPLANT
SCRUB CHG 4% DYNA-HEX 4OZ (MISCELLANEOUS) ×4 IMPLANT
SEAL UNIV 5-12 XI (MISCELLANEOUS) ×8 IMPLANT
SET TRI-LUMEN FLTR TB AIRSEAL (TUBING) ×2 IMPLANT
SPIKE FLUID TRANSFER (MISCELLANEOUS) ×2 IMPLANT
SPONGE T-LAP 18X18 ~~LOC~~+RFID (SPONGE) IMPLANT
SURGIFLO W/THROMBIN 8M KIT (HEMOSTASIS) IMPLANT
SUT MNCRL AB 4-0 PS2 18 (SUTURE) IMPLANT
SUT PDS AB 1 TP1 54 (SUTURE) IMPLANT
SUT VIC AB 0 CT1 27XBRD ANTBC (SUTURE) IMPLANT
SUT VIC AB 2-0 CT1 TAPERPNT 27 (SUTURE) IMPLANT
SUT VIC AB 4-0 PS2 18 (SUTURE) ×4 IMPLANT
SUT VICRYL 0 27 CT2 27 ABS (SUTURE) ×2 IMPLANT
SYR 10ML LL (SYRINGE) IMPLANT
SYSTEM BAG RETRIEVAL 10MM (BASKET) IMPLANT
SYSTEM WOUND ALEXIS 18CM MED (MISCELLANEOUS) IMPLANT
TRAP SPECIMEN MUCUS 40CC (MISCELLANEOUS) IMPLANT
TRAY FOLEY MTR SLVR 16FR STAT (SET/KITS/TRAYS/PACK) ×2 IMPLANT
TROCAR PORT AIRSEAL 5X120 (TROCAR) IMPLANT
TROCAR XCEL NON-BLD 5MMX100MML (ENDOMECHANICALS) IMPLANT
TUBING SMOKE EVAC (TUBING) IMPLANT
UNDERPAD 30X36 HEAVY ABSORB (UNDERPADS AND DIAPERS) ×4 IMPLANT
WATER STERILE IRR 1000ML POUR (IV SOLUTION) ×2 IMPLANT
YANKAUER SUCT BULB TIP 10FT TU (MISCELLANEOUS) IMPLANT

## 2024-04-24 NOTE — Op Note (Signed)
 GYNECOLOGIC ONCOLOGY OPERATIVE NOTE  Date of Service: 04/24/2024  Preoperative Diagnosis: High grade cervical dysplasia, high grade vulvar dysplasia, history of kidney transplant  Postoperative Diagnosis: Same  Procedures: Robotic-assisted total laparoscopic hysterectomy, bilateral salpingectomy, CO2 laser ablation of the vulva  Surgeon: [name], MD  Assistants: Olam Mill, MD and (an MD assistant was necessary for tissue manipulation, management of robotic instrumentation, retraction and positioning due to the complexity of the case and hospital policies)  Anesthesia: General  Estimated Blood Loss: 50 mL    Fluids: 1000 ml, crystalloid  Urine Output: 150 ml, clear yellow  Findings: On speculum exam, cervix flush with vaginal apex with little clear visible cervix remaining.  Stenosis encountered with attempt at manipulator placement with manipulator ultimately placed with likely false track into the anterior uterine wall but with no perforation.  However, during case with manipulation perforation did occur at the anterior uterine fundus.  On entry to abdomen, upper abdominal survey with adhesions of the omentum to the upper anterior abdominal wall, smooth diaphragm, liver, stomach and normal appearing omentum and bowel. In the pelvis, band of adhesion of the uterus to the anterior abdominal wall consistent with cesarean section scar.  Filmy adhesions of the rectosigmoid colon to the posterior cul-de-sac.  Filmy adhesions of the bladder to the anterior uterus.  Evidence of prior bilateral tubal ligation.  Normal-appearing bilateral ovaries.  Pelvic kidney in the right iliac fossa with evidence of anastomoses retroperitoneally.  On vulvar exam with acetic acid  placed, extensive acetowhite changes of verrucous lesions scattered throughout bilateral labia, predominantly on the medial and lateral surfaces of bilateral labia minora, the clitoral hood, and the medial surfaces of the  bilateral labia majora.  Additional areas extending onto the posterior fourchette approaching the vaginal mucosa and on the perineal body.  Additional likely acetowhite changes on the anus and posterior to the anus.  Specimens:  ID Type Source Tests Collected by Time Destination  1 : UTERUS,CERVIX AND BILATERAL FALLOPIAN TUBES Tissue PATH Gyn benign resection SURGICAL PATHOLOGY Masters Hoy, MD 04/24/2024 [phone]     Complications:  None  Indications for Procedure: Samantha [name] is a 58 y.o. woman with a history of cervical dysplasia in the setting of a kidney transplant.  She has undergone multiple cervical excisional procedures with minimal residual cervix remaining making any additional surveillance of her cervix challenging.  Decision made to proceed with hysterectomy.  Additionally, patient has biopsy-proven high-grade vulvar dysplasia with little progress on imiquimod  treatment.  Prior to the procedure, all risks, benefits, and alternatives were discussed and informed surgical consent was signed.  Procedure: Patient was taken to the operating room where general anesthesia was achieved.  She was positioned in dorsal lithotomy and prepped and draped.  A foley catheter was inserted into the bladder.  The cervix was dilated and an Advincula uterine manipulator with a colpotomy ring was inserted into the uterus.  A 12 mm incision was made in the left upper quadrant near Palmer's point.  The abdomen was entered with a 5 mm OptiView trocar under direct visualization.  The abdomen was insufflated, the patient placed in steep Trendelenburg, and additional trocars were placed as follows: an 8mm trocar superior to the umbilicus, two 8 mm robotic trocars in the right abdomen, and one 8 mm robotic trocar in the left abdomen.  The left upper quadrant trocar was removed and replaced with a 12 mm airseal trocar.  All trocars were placed under direct visualization.  The bowels were moved into  the upper  abdomen.  The DaVinci robotic surgical system was brought to the patient's bedside and docked.  The right round ligament was transected and the retroperitoneum entered. Anastomoses to the right pelvic transplanted kidney were observed transperitoneally.  The retroperitoneum was not entered extensively to minimize disruption to the transplanted kidney.  The fimbriated end of the right fallopian tube was grasped and elevated.  The underlying mesosalpinx was serially cauterized and transected towards the uterine cornua.  A window was made in the medial leaf of the broad ligament near the uterine cornua.  The right utero-ovarian ligament was isolated, cauterized, and transected.  The posterior peritoneum was opened to the KOH ring tracking towards the uterus to stay far from the area of the transplanted kidney.  At this time, the uterine manipulator was noted to only be located in the lower uterine segment.  The manipulator was replaced and was advanced further towards the fundus but was noted to likely be tracking in a false track in the anterior uterine wall.  The left round ligament was transected and the retroperitoneum entered.  The fimbriated end of the left loping tube was grasped and elevated.  The underlying mesosalpinx was serially cauterized and transected for the uterine cornua.  An window was made in the medial leaf of the broad ligament near the uterine cornua.  The left utero-ovarian ligament was isolated, cauterized, and transected.  The posterior peritoneum was opened to the KOH ring.  With additional manipulation of the uterus, the manipulator was noted to have perforation through the anterior uterine fundus.  The band of scar tissue from the anterior uterine wall to the anterior pelvic wall was isolated, cauterized and transected.  Adhesions of the bladder to the anterior uterus were lysed sharply.  On the left, the anterior peritoneum was opened and the bladder flap was created.  The left uterine  artery was skeletonized, cauterized, and transected at the level of the KOH ring. Additional cautery was used in a C-shaped fashion to allow the remainder of the broad, cardinal, and uterosacral ligaments with the uterine vessels to be transected and fall away from the KOH ring. A similar procedure was performed on the right side, hugging to the uterine cervix.  The uterine manipulator was removed and an EEA sizer was placed in the anterior vaginal fornix.  An anterior colpotomy was made.  The cervix was then visualized vaginally.  The manger of the colpotomy was made circumferentially following the contours of the cervix.  The uterine specimen was removed through the vagina.  The vaginal cuff was closed with a running stitch of 0 Vicryl suture.  The pelvis was irrigated and all operative sites were found to be hemostatic.  All instruments were removed and the robot was taken from the patient's bedside. The fascia at the 12 mm incision was closed with 0 Vicryl using a PMI device. The abdomen was desufflated and all ports were removed. The skin at all incisions was closed with 4-0 Vicryl to reapproximate the subcutaneous tissue and 4-0 monocryl in a subcuticular fashion followed by surgical glue.  Next, attention was turned to the vulva. A thorough exam of the vulva was done after placing a moist sponge with acetic acid  on the vulva with the findings as noted above. The area to be removed was outlined to a 5mm margin. Using the CO2 laser, energy was applied with 8 Watts of continuous power.  The ablation was performed down to a second surgical layer.  The wound  bed was hemostatic. Silvadene  cream was applied to the wound bed.   Patient tolerated the procedure well. Sponge, lap, and instrument counts were correct.  Patient received 2 gm of Ancef  and 500mg  of metronidazole  prior to skin incision for routine perioperative antibiotic prophylaxis.  She was extubated and taken to the PACU in stable  condition.  [name], MD Gynecologic Oncology

## 2024-04-24 NOTE — Anesthesia Procedure Notes (Signed)
 Procedure Name: Intubation Date/Time: 04/24/2024 7:37 AM  Performed by: Lanning Cena RAMAN, CRNAPre-anesthesia Checklist: Patient identified, Emergency Drugs available, Patient being monitored, Suction available and Timeout performed Patient Re-evaluated:Patient Re-evaluated prior to induction Oxygen Delivery Method: Circle system utilized Preoxygenation: Pre-oxygenation with 100% oxygen Induction Type: IV induction Ventilation: Mask ventilation without difficulty Laryngoscope Size: Miller and 2 Grade View: Grade II Tube type: Oral Tube size: 7.5 mm Number of attempts: 1 Airway Equipment and Method: Stylet Placement Confirmation: ETT inserted through vocal cords under direct vision, positive ETCO2, CO2 detector and breath sounds checked- equal and bilateral Secured at: 21 cm Tube secured with: Tape Dental Injury: Teeth and Oropharynx as per pre-operative assessment

## 2024-04-24 NOTE — Transfer of Care (Signed)
 Immediate Anesthesia Transfer of Care Note  Patient: Samantha [name]  Procedure(s) Performed: ROBOTIC ASSISTED TOTAL LAPAROSCOPIC HYSTERECTOMY, BILATERAL SALPINGECTOMY (Bilateral) CO2 LASER ABLATION OF THE VULVA  Patient Location: PACU  Anesthesia Type:General  Level of Consciousness: awake and oriented  Airway & Oxygen Therapy: Patient Spontanous Breathing and Patient connected to face mask oxygen  Post-op Assessment: Report given to RN and Post -op Vital signs reviewed and stable  Post vital signs: Reviewed and stable  Last Vitals:  Vitals Value Taken Time  BP 138/69 04/24/24 11:03  Temp    Pulse 60 04/24/24 11:06  Resp 17 04/24/24 11:06  SpO2 91 % 04/24/24 11:06  Vitals shown include unfiled device data.  Last Pain:  Vitals:   04/24/24 0637  TempSrc:   PainSc: 0-No pain         Complications: No notable events documented.

## 2024-04-24 NOTE — Anesthesia Postprocedure Evaluation (Signed)
 Anesthesia Post Note  Patient: Oaklynn L Mack-Johnson  Procedure(s) Performed: ROBOTIC ASSISTED TOTAL LAPAROSCOPIC HYSTERECTOMY, BILATERAL SALPINGECTOMY (Bilateral) CO2 LASER ABLATION OF THE VULVA     Patient location during evaluation: PACU Anesthesia Type: General Level of consciousness: awake and alert, oriented and patient cooperative Pain management: pain level controlled Vital Signs Assessment: post-procedure vital signs reviewed and stable Respiratory status: spontaneous breathing, nonlabored ventilation and respiratory function stable Cardiovascular status: blood pressure returned to baseline and stable Postop Assessment: no apparent nausea or vomiting Anesthetic complications: no   No notable events documented.  Last Vitals:  Vitals:   04/24/24 1139 04/24/24 1145  BP:  122/61  Pulse: (!) 55 (!) 53  Resp: 11 10  Temp:    SpO2: 95% 97%    Last Pain:  Vitals:   04/24/24 1145  TempSrc:   PainSc: 0-No pain                 Almarie CHRISTELLA Marchi

## 2024-04-24 NOTE — Significant Event (Signed)
 At timeout, discrepancy noted between prior clinic consent discussion and OR consent in regards to patient's ovaries. Several areas of documentation in the chart regarding pt's wishes to minimize surgical extent and to leave ovaries in situ. One note per call to nurse that patient would want ovaries removed. Given this discrepancy, call to pt's designated emergency contact, Alfonzo, who reports that the patient had expressed to him that she wanted surgical extent minimized and to leave ovaries in situ. Given that this is consistent with the discussion I had with the patient preoperatively, decision made to proceed with salpingectomy only and to leave ovaries in situ.

## 2024-04-24 NOTE — Discharge Instructions (Addendum)
 AFTER SURGERY INSTRUCTIONS   Return to work: 4-6 weeks if applicable   Activity: 1. Be up and out of the bed during the day.  Take a nap if needed.  You may walk up steps but be careful and use the hand rail.  Stair climbing will tire you more than you think, you may need to stop part way and rest.    2. No lifting or straining for 6 weeks over 10 pounds. No pushing, pulling, straining for 6 weeks.   3. No driving for 4-89 days when the following criteria have been met: Do not drive if you are taking narcotic pain medicine and make sure that your reaction time has returned.    4. You can shower as soon as the next day after surgery. Shower daily.  Use your regular soap and water  (not directly on the incision) and pat your incision(s) dry afterwards; don't rub.  No tub baths or submerging your body in water  until cleared by your surgeon. If you have the soap that was given to you by pre-surgical testing that was used before surgery, you do not need to use it afterwards because this can irritate your incisions.    5. No sexual activity and nothing in the vagina for 12 weeks.   6. You may experience a small amount of clear drainage from your incisions, which is normal.  If the drainage persists, increases, or changes color please call the office.   7. Do not use creams, lotions, or ointments such as neosporin on your incisions after surgery until advised by your surgeon because they can cause removal of the dermabond glue on your incisions.     8. You may experience vaginal spotting after surgery or when the stitches at the top of the vagina begin to dissolve.  The spotting is normal but if you experience heavy bleeding, call our office.   9. Take Tylenol  first for pain if you are able to take these medication and only use narcotic pain medication for severe pain not relieved by the Tylenol .  Monitor your Tylenol  intake to a max of 4,000 mg in a 24 hour period.    10. Resume aspirin   tomorrow Diet: 1. Low sodium Heart Healthy Diet is recommended but you are cleared to resume your normal (before surgery) diet after your procedure.   2. It is safe to use a laxative, such as Miralax or Colace, if you have difficulty moving your bowels before surgery. You have been prescribed Sennakot-S to take at bedtime every evening after surgery to keep bowel movements regular and to prevent constipation.     Wound Care: 1. Keep clean and dry.  Shower daily. 2. You can apply SILVADENE  CREAM to the vulva 1-2x a day as needed. Let us  know if you need a refill.   Reasons to call the Doctor: Fever - Oral temperature greater than 100.4 degrees Fahrenheit Foul-smelling vaginal discharge Difficulty urinating Nausea and vomiting Increased pain at the site of the incision that is unrelieved with pain medicine. Difficulty breathing with or without chest pain New calf pain especially if only on one side Sudden, continuing increased vaginal bleeding with or without clots.   Contacts: For questions or concerns you should contact:   Dr. [name] at [phone]   Eleanor Epps, NP at [phone]   After Hours: call [phone] and have the GYN Oncologist paged/contacted (after 5 pm or on the weekends). You will speak with an after hours RN and let he  or she know you have had surgery.   Messages sent via mychart are for non-urgent matters and are not responded to after hours so for urgent needs, please call the after hours number.

## 2024-04-24 NOTE — Interval H&P Note (Signed)
 History and Physical Interval Note:  04/24/2024 7:13 AM  Samantha [name]  has presented today for surgery, with the diagnosis of N87.9 CERVICAL DYSPLASIA VULVAR DYSPLASIA.  The various methods of treatment have been discussed with the patient and family. After consideration of risks, benefits and other options for treatment, the patient has consented to  Procedures with comments: HYSTERECTOMY, TOTAL, ROBOT-ASSISTED, LAPAROSCOPIC, WITH BILATERAL SALPINGO-OOPHORECTOMY (Bilateral) - with BSO DESTRUCTION, LESION, GENITALIA (N/A) - possible co2 laser of vulva as a surgical intervention.  The patient's history has been reviewed, patient examined, no change in status, stable for surgery.  I have reviewed the patient's chart and labs.  Questions were answered to the patient's satisfaction.     Cam Dauphin

## 2024-04-25 ENCOUNTER — Telehealth: Payer: Self-pay | Admitting: *Deleted

## 2024-04-25 ENCOUNTER — Encounter (HOSPITAL_COMMUNITY): Payer: Self-pay | Admitting: Psychiatry

## 2024-04-25 NOTE — Telephone Encounter (Signed)
 Spoke with Ms. Mack-Johnson this morning. She states she is eating, drinking and urinating well. She has not had a BM yet but is passing gas. She is taking senokot as prescribed and encouraged her to drink plenty of water . She denies fever or chills. Incisions are dry and intact. She rates her pain 2/10. Her pain is controlled with tylenol .    Instructed to call office with any fever, chills, purulent drainage, uncontrolled pain or any other questions or concerns. Patient verbalizes understanding.   Pt aware of post op appointments as well as the office number [phone] and after hours number [phone] to call if she has any questions or concerns

## 2024-04-26 LAB — SURGICAL PATHOLOGY

## 2024-04-30 ENCOUNTER — Other Ambulatory Visit: Payer: Self-pay | Admitting: Gynecologic Oncology

## 2024-04-30 ENCOUNTER — Telehealth: Payer: Self-pay

## 2024-04-30 DIAGNOSIS — D071 Carcinoma in situ of vulva: Secondary | ICD-10-CM

## 2024-04-30 MED ORDER — SILVER SULFADIAZINE 1 % EX CREA: 1.0000 | TOPICAL_CREAM | Freq: Two times a day (BID) | CUTANEOUS | 0 refills | Status: AC

## 2024-04-30 NOTE — Telephone Encounter (Signed)
-----   Message from Eleanor Epps, NP sent at 04/30/2024  4:08 PM EST ----- Please let the patient know the results of her pathology:  -They noted CIN-2 (precancer changes, dysplasia) within the cervix.  There was no cancer seen within the cervix. - There was no precancer or cancer seen in the lining of the uterus. -In the muscle wall of the uterus they note fibroids with no cancer within those. -With both fallopian tubes, there is no sign of cancer or endometriosis.

## 2024-04-30 NOTE — Telephone Encounter (Signed)
 S/PRobotic-assisted total laparoscopic hysterectomy, bilateral salpingectomy, CO2 laser ablation of the vulva on 12/16 with Dr. Eldonna    Per Samantha Epps Np, pt is aware of the pathology report and voiced an understanding.   She is asking for a refill of the burn cream that was given for her to use at home. She states she is not completely out but uses it each time she goes to the bathroom and does not want to run out. She states she has not looked at the site with a mirror. She does wear a pad but notes there is no bleeding/oozing outside of the normal healing. No fever/chills. No pain since the day after surgery.  Post op appointment scheduled for 12/29

## 2024-05-01 NOTE — Telephone Encounter (Signed)
 Samantha [name] is aware to continue using the Silvadene  cream and aware a refill has been sent to her pharmacy. She was thankful for the call

## 2024-05-07 ENCOUNTER — Inpatient Hospital Stay: Attending: Psychiatry | Admitting: Psychiatry

## 2024-05-07 ENCOUNTER — Encounter: Payer: Self-pay | Admitting: Psychiatry

## 2024-05-07 VITALS — BP 136/68 | HR 66 | Temp 98.1°F | Resp 19 | Wt 236.0 lb

## 2024-05-07 DIAGNOSIS — L24A9 Irritant contact dermatitis due friction or contact with other specified body fluids: Secondary | ICD-10-CM | POA: Insufficient documentation

## 2024-05-07 DIAGNOSIS — Z90722 Acquired absence of ovaries, bilateral: Secondary | ICD-10-CM | POA: Insufficient documentation

## 2024-05-07 DIAGNOSIS — Z7189 Other specified counseling: Secondary | ICD-10-CM

## 2024-05-07 DIAGNOSIS — N871 Moderate cervical dysplasia: Secondary | ICD-10-CM | POA: Insufficient documentation

## 2024-05-07 DIAGNOSIS — D071 Carcinoma in situ of vulva: Secondary | ICD-10-CM | POA: Diagnosis not present

## 2024-05-07 DIAGNOSIS — Z90710 Acquired absence of both cervix and uterus: Secondary | ICD-10-CM | POA: Insufficient documentation

## 2024-05-07 DIAGNOSIS — Z940 Kidney transplant status: Secondary | ICD-10-CM | POA: Diagnosis not present

## 2024-05-07 MED ORDER — AMOXICILLIN-POT CLAVULANATE 875-125 MG PO TABS: 1.0000 | ORAL_TABLET | Freq: Two times a day (BID) | ORAL | 0 refills | Status: AC

## 2024-05-07 MED ORDER — LIDOCAINE 5 % EX OINT: 1.0000 | TOPICAL_OINTMENT | CUTANEOUS | 3 refills | Status: AC | PRN

## 2024-05-07 NOTE — Progress Notes (Signed)
 Gynecologic Oncology Return Clinic Visit  Date of Service: 05/07/2024 Referring Provider: Rubie Husky, MD   Assessment & Plan: Samantha [name] is a 58 y.o. 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  Postop: - Pt recovering well from surgery and healing appropriately postoperatively - Ongoing postoperative expectations and precautions reviewed. Continue with no lifting >10lbs through 6 weeks postoperatively  CIN2 - Intraoperative findings and pathology results reviewed. - Positive ectocervical margin with CIN2 - recommend repeat pap smear in 635mo. May ultimately require additional treatment vaginally  VIN3: - S/p imiquimod  treatment with limited response - Treated with laser in OR. Given extent of disease, may require additional treatment in OR in the future. - Refill for topical lidocaine  sent - Recommend sitz baths 2x daily to aide with healing - Rx for augmentin BID x7d sent given exudate. - Return precautions reviewed.   RTC 35mo.  [name], MD Gynecologic Oncology   Medical Decision Making I personally spent  TOTAL 25 minutes face-to-face and non-face-to-face in the care of this patient, which includes all pre, intra, and post visit time on the date of service. The discussion of treatment of cervical and vulvar dysplasia is beyond the scope of routine postoperative care.   ----------------------- Reason for Visit: Postop/Treatment discussion  Treatment History: Patient has a history of abnormal Pap smears as follows: 03/02/2016: Pap NILM 06/10/2017: Pap ASCUS H, HPV negative 07/13/2017: Colpo visually normal, ECC benign 02/08/2019: Pap NILM, HR HPV positive 03/08/2019: Colpo visually normal, ECC benign 01/04/2022: Pap ASCUS, HPV 16 positive 01/26/2022: Colpo visually normal, ECC benign 02/15/2023: Pap L SIL, HPV 16  positive 03/25/2023: Colpo biopsy CIN-1, ECC CIN-2 09/02/23: LEEP residual CIN-2, positive margin at the endocervix 11/29/2023: CKC benign, LSIL/CIN-1 with focal area concerning for H SIL/CIN-2 on ECC   Patient's medical history is complicated by a history of chronic kidney disease, now status post kidney transplant on immunosuppression.  In terms of her transplant history, she reports that her transplant nephrology team is at [facility].  She otherwise follows with Dr. Bernardino Gasman with [facility] locally in [place].  She underwent a cardiac catheterization in 2017.  She reports that this may have been a part of her workup for her kidney transplant.  She denies any chest pain or shortness of breath.  In terms of her diabetes her most recent A1c was on 12/15/2023 and was 6.3.  She denies any vulvar itching today.    Interval History: Pt reports that she is recovering well from surgery. She is not needing anything anymore for pain for the hysterectomy. She has been using lidocaine  ointment for the vulva which helps, needs a refill. She is eating and drinking okay, but has less appetite. Occasional nausea but nothing persistent. She is voiding without issue and having regular bowel movements. Some small spotting from the outside of the vulva.   Past Medical/Surgical History: Past Medical History:  Diagnosis Date   Anemia in chronic kidney disease (CKD)    Chronic kidney disease, stage V (HCC)    s/p kidney transplant 03/28/2020   Diabetic retinopathy (HCC)    Heart murmur    has ECHO scheduled for 05-18-24   Hx of goiter 2009   had thyroidectomy  on no medication for replacement   Hypertension    Kidney transplanted    Uncontrolled diabetes mellitus with microalbuminuria or microproteinuria  Past Surgical History:  Procedure Laterality Date   CARDIAC CATHETERIZATION N/A 03/23/2016   Procedure: Left Heart Cath and Coronary Angiography;  Surgeon: Gordy Bergamo, MD;  Location: [facility]  INVASIVE CV LAB;  Service: Cardiovascular;  Laterality: N/A;   CATARACT EXTRACTION W/ INTRAOCULAR LENS IMPLANT Bilateral    CERVICAL CONIZATION W/BX N/A 11/29/2023   Procedure: CONE BIOPSY, CERVIX;  Surgeon: Lilton Legions, DO;  Location: [facility] OR;  Service: Gynecology;  Laterality: N/A;   CESAREAN SECTION  2005   CESAREAN SECTION  1993   DILITATION & CURRETTAGE/HYSTROSCOPY WITH NOVASURE ABLATION N/A 10/24/2014   Procedure: DILATATION & CURETTAGE/HYSTEROSCOPY WITH NOVASURE ABLATION;  Surgeon: Gigi Botts, MD;  Location: [facility] ORS;  Service: Gynecology;  Laterality: N/A;   KIDNEY TRANSPLANT  03/28/2020   [facility]   LEEP N/A 09/02/2023   Procedure: LEEP (LOOP ELECTROSURGICAL EXCISION PROCEDURE);  Surgeon: Lilton Legions, DO;  Location: [facility] OR;  Service: Gynecology;  Laterality: N/A;   LESION DESTRUCTION N/A 04/24/2024   Procedure: CO2 LASER ABLATION OF THE VULVA;  Surgeon: Eldonna Mays, MD;  Location: [facility] ORS;  Service: Gynecology;  Laterality: N/A;  possible co2 laser of vulva   ROBOTIC ASSISTED TOTAL HYSTERECTOMY WITH BILATERAL SALPINGO OOPHERECTOMY Bilateral 04/24/2024   Procedure: ROBOTIC ASSISTED TOTAL LAPAROSCOPIC HYSTERECTOMY, BILATERAL SALPINGECTOMY;  Surgeon: Eldonna Mays, MD;  Location: [facility] ORS;  Service: Gynecology;  Laterality: Bilateral;  with BSO   THYROIDECTOMY  2009   partial   TUBAL LIGATION  2005    Family History  Problem Relation Age of Onset   Hypertension Mother    Diabetes Mother    Kidney disease Mother    Crohn's disease Mother    Diabetes Father    Hypertension Father    Ulcers Father    Kidney disease Father    Breast cancer Neg Hx    Ovarian cancer Neg Hx    Endometrial cancer Neg Hx    Colon cancer Neg Hx     Social History   Socioeconomic History   Marital status: Divorced    Spouse name: Not on file   Number of children: Not on file   Years of education: Not on file   Highest education level: Not on file  Occupational History   Not  on file  Tobacco Use   Smoking status: Never   Smokeless tobacco: Never  Vaping Use   Vaping status: Never Used  Substance and Sexual Activity   Alcohol use: No   Drug use: No   Sexual activity: Not Currently    Birth control/protection: Other-see comments    Comment: Ablation  Other Topics Concern   Not on file  Social History Narrative   Not on file   Social Drivers of Health   Tobacco Use: Low Risk (04/24/2024)   Patient History    Smoking Tobacco Use: Never    Smokeless Tobacco Use: Never    Passive Exposure: Not on file  Financial Resource Strain: Low Risk (04/14/2023)   Overall Financial Resource Strain (CARDIA)    Difficulty of Paying Living Expenses: Not hard at all  Food Insecurity: Low Risk (08/01/2023)   Received from [facility]   Epic    Within the past 12 months, you worried that your food would run out before you got money to buy more: Never true    Within the past 12 months, the food you bought just didn't last and you didn't have money to get more. : Never true  Transportation Needs: No Transportation Needs (08/01/2023)  Received from [facility]    In the past 12 months, has lack of reliable transportation kept you from medical appointments, meetings, work or from getting things needed for daily living? : No  Physical Activity: Inactive (04/14/2023)   Exercise Vital Sign    Days of Exercise per Week: 0 days    Minutes of Exercise per Session: 0 min  Stress: No Stress Concern Present (04/14/2023)   [employer] of Occupational Health - Occupational Stress Questionnaire    Feeling of Stress : Not at all  Social Connections: Moderately Isolated (04/14/2023)   Social Connection and Isolation Panel    Frequency of Communication with Friends and Family: More than three times a week    Frequency of Social Gatherings with Friends and Family: Once a week    Attends Religious Services: Never    Database Administrator or Organizations: Yes     Attends Banker Meetings: 1 to 4 times per year    Marital Status: Divorced  Depression (PHQ2-9): Low Risk (12/21/2023)   Depression (PHQ2-9)    PHQ-2 Score: 0  Alcohol Screen: Low Risk (04/14/2023)   Alcohol Screen    Last Alcohol Screening Score (AUDIT): 0  Housing: Low Risk (08/01/2023)   Received from [facility]   Epic    What is your living situation today?: I have a steady place to live    Think about the place you live. Do you have problems with any of the following? Choose all that apply:: None/None on this list  Utilities: Low Risk (08/01/2023)   Received from [facility]   Utilities    In the past 12 months has the electric, gas, oil, or water  company threatened to shut off services in your home? : No  Health Literacy: Adequate Health Literacy (04/14/2023)   B1300 Health Literacy    Frequency of need for help with medical instructions: Never    Current Medications: Current Medications[1]  Review of Symptoms: Complete 10-system review is positive for: Less appetite, fatigue, nausea  Physical Exam: BP (!) 143/49 (BP Location: Left Arm, Patient Position: Sitting)   Pulse 66   Temp 98.1 F (36.7 C) (Oral)   Resp 19   Wt 236 lb (107 kg)   LMP  (LMP Unknown)   SpO2 100%   BMI 36.15 kg/m  General: Alert, oriented, no acute distress. HEENT: Normocephalic, atraumatic. Neck symmetric without masses. Sclera anicteric.  Chest: Normal work of breathing. Clear to auscultation bilaterally.   Cardiovascular: Regular rate and rhythm, no murmurs. Abdomen: Soft, nontender.  Normoactive bowel sounds.  No masses appreciated.  Well-healing incisions. Extremities: Grossly normal range of motion.  Warm, well perfused.  No edema bilaterally. Skin: No rashes or lesions noted. GU: External genitalia with evidence of recent laser ablation with some inflammation and exudate.  Speculum exam with some yellow discharge, cuff intact.  Bimanual exam reveals intact vaginal cuff.  Exam chaperoned by Kimberly Jordan, CMA   Laboratory & Radiologic Studies: Surgical pathology (04/24/24): A. UTERUS, CERVIX, FALLOPIAN TUBE, BILATERAL, HYSTERECTOMY:  Cervix     High-grade squamous intraepithelial lesion, CIN-2 (high-grade  dysplasia).     CIN-2 involves the ectocervical margin in the 3-6 o'clock quadrant.      Negative for invasive carcinoma.  Endometrium     Inactive endometrium.      Negative for endometrial intraepithelial neoplasia (EIN) and  malignancy.  Myometrium     Leiomyomata with degenerative changes and calcifications.  Negative for malignancy.  Right fallopian tube      Unremarkable.      Negative for endometriosis and malignancy.  Left fallopian tube      Findings consistent with previous ligation.      Negative for endometriosis and malignancy.      [1]  Current Outpatient Medications:    amoxicillin-clavulanate (AUGMENTIN) 875-125 MG tablet, Take 1 tablet by mouth 2 (two) times daily for 7 days., Disp: 14 tablet, Rfl: 0   aspirin  EC 81 MG tablet, Take 1 tablet (81 mg total) by mouth daily. Swallow whole., Disp: 90 tablet, Rfl: 3   atorvastatin  (LIPITOR) 80 MG tablet, Take 80 mg by mouth every evening., Disp: , Rfl:    azaTHIOprine (IMURAN) 50 MG tablet, Take 50 mg by mouth in the morning., Disp: , Rfl:    Azathioprine 75 MG TABS, Take 75 mg by mouth in the morning., Disp: , Rfl:    azelastine  (OPTIVAR ) 0.05 % ophthalmic solution, Place 1 drop into both eyes daily as needed (stye treatment)., Disp: , Rfl:    carvedilol (COREG) 6.25 MG tablet, Take 6.25 mg by mouth in the morning and at bedtime., Disp: , Rfl:    Continuous Glucose Receiver (DEXCOM G7 RECEIVER) DEVI, , Disp: , Rfl:    imiquimod  (ALDARA ) 5 % cream, Apply topically 3 (three) times a week., Disp: 48 each, Rfl: 3   insulin  glargine (LANTUS  SOLOSTAR) 100 UNIT/ML Solostar Pen, Inject 15 units sq nightly (Patient taking differently: Inject 15 Units into the skin 2 (two) times daily.),  Disp: , Rfl:    insulin  lispro (HUMALOG  KWIKPEN) 100 UNIT/ML KwikPen, Per sliding scale through [facility] transplant team, Disp: 15 mL, Rfl: 11   lidocaine  (XYLOCAINE ) 5 % ointment, Apply 1 Application topically as needed., Disp: 30 g, Rfl: 3   MOUNJARO  12.5 MG/0.5ML Pen, Inject 12.5 mg into the skin once a week., Disp: , Rfl:    NIFEdipine  (ADALAT  CC) 90 MG 24 hr tablet, Take 1 tablet (90 mg total) by mouth daily., Disp: 90 tablet, Rfl: 3   ondansetron  (ZOFRAN ) 4 MG tablet, Take 1 tablet (4 mg total) by mouth every 8 (eight) hours as needed for nausea or vomiting., Disp: 10 tablet, Rfl: 0   oxyCODONE  (OXY IR/ROXICODONE ) 5 MG immediate release tablet, Take 1 tablet (5 mg total) by mouth every 12 (twelve) hours as needed for severe pain (pain score 7-10). For AFTER surgery only, do not take and drive, Disp: 10 tablet, Rfl: 0   predniSONE  (DELTASONE ) 5 MG tablet, Take 5 mg by mouth in the morning., Disp: , Rfl:    senna-docusate (SENOKOT-S) 8.6-50 MG tablet, Take 2 tablets by mouth at bedtime. For AFTER surgery, do not take if having diarrhea, Disp: 30 tablet, Rfl: 0   silver  sulfADIAZINE  (SILVADENE ) 1 % cream, Apply 1 Application topically 2 (two) times daily. Apply to the lasered area on the vulva, Disp: 50 g, Rfl: 0   sulfamethoxazole-trimethoprim (BACTRIM) 400-80 MG tablet, Take 1 tablet by mouth every Monday, Wednesday, and Friday. In the morning., Disp: , Rfl:    tacrolimus  (PROGRAF ) 1 MG capsule, Take 2 mg by mouth in the morning and at bedtime., Disp: , Rfl:    torsemide (DEMADEX) 20 MG tablet, Take 20 mg by mouth in the morning., Disp: , Rfl:

## 2024-05-07 NOTE — Patient Instructions (Signed)
 It was a pleasure to see you in clinic today. - Recommend sitz baths twice a day - Refill for lidocaine  sent - Prescription for antibiotic sent. Take twice a day for 7 days - Return visit planned for 3 months  Thank you very much for allowing me to provide care for you today.  I appreciate your confidence in choosing our Gynecologic Oncology team at [facility].  If you have any questions about your visit today please call our office or send us  a MyChart message and we will get back to you as soon as possible.

## 2024-05-18 ENCOUNTER — Ambulatory Visit (HOSPITAL_COMMUNITY)

## 2024-05-21 ENCOUNTER — Inpatient Hospital Stay: Admitting: Psychiatry

## 2024-05-23 LAB — OPHTHALMOLOGY REPORT-SCANNED

## 2024-05-24 ENCOUNTER — Ambulatory Visit: Admitting: Student in an Organized Health Care Education/Training Program

## 2024-06-13 ENCOUNTER — Ambulatory Visit

## 2024-06-13 VITALS — Ht 67.75 in | Wt 230.0 lb

## 2024-06-13 DIAGNOSIS — Z0000 Encounter for general adult medical examination without abnormal findings: Secondary | ICD-10-CM

## 2024-06-13 NOTE — Patient Instructions (Signed)
 Samantha [name],  Thank you for taking the time for your Medicare Wellness Visit. I appreciate your continued commitment to your health goals. Please review the care plan we discussed, and feel free to reach out if I can assist you further.  Please note that Annual Wellness Visits do not include a physical exam. Some assessments may be limited, especially if the visit was conducted virtually. If needed, we may recommend an in-person follow-up with your provider.  Ongoing Care Seeing your primary care provider every 3 to 6 months helps us  monitor your health and provide consistent, personalized care.   Referrals If a referral was made during today's visit and you haven't received any updates within two weeks, please contact the referred provider directly to check on the status.  Recommended Screenings:  Health Maintenance  Topic Date Due   Hepatitis B Vaccine (2 of 2 - CpG 2-dose series) 02/26/2020   COVID-19 Vaccine (5 - 2025-26 season) 01/09/2024   Hemoglobin A1C  04/08/2024   Medicare Annual Wellness Visit  04/13/2024   Kidney health urinalysis for diabetes  06/22/2024   DTaP/Tdap/Td vaccine (2 - Td or Tdap) 07/19/2024   Complete foot exam   12/20/2024   Breast Cancer Screening  02/14/2025   Yearly kidney function blood test for diabetes  04/19/2025   Eye exam for diabetics  05/23/2025   Pneumococcal Vaccine for age over 72 (3 of 3 - PCV20 or PCV21) 05/21/2026   Colon Cancer Screening  01/23/2032   Flu Shot  Completed   HPV Vaccine (No Doses Required) Completed   Hepatitis C Screening  Completed   HIV Screening  Completed   Zoster (Shingles) Vaccine  Completed   Meningitis B Vaccine  Aged Out       06/13/2024    4:13 PM  Advanced Directives  Does Patient Have a Medical Advance Directive? No  Would patient like information on creating a medical advance directive? No - Patient declined    Vision: Annual vision screenings are recommended for early detection of glaucoma,  cataracts, and diabetic retinopathy. These exams can also reveal signs of chronic conditions such as diabetes and high blood pressure.  Dental: Annual dental screenings help detect early signs of oral cancer, gum disease, and other conditions linked to overall health, including heart disease and diabetes.  Please see the attached documents for additional preventive care recommendations.

## 2024-06-13 NOTE — Progress Notes (Signed)
 "  Chief Complaint  Patient presents with   Medicare Wellness     Subjective:   KARESHA TRZCINSKI is a 59 y.o. female who presents for a Medicare Annual Wellness Visit.  Visit info / Clinical Intake: Medicare Wellness Visit Type:: Subsequent Annual Wellness Visit Persons participating in visit and providing information:: patient Medicare Wellness Visit Mode:: Video Since this visit was completed virtually, some vitals may be partially provided or unavailable. Missing vitals are due to the limitations of the virtual format.: Documented vitals are patient reported If Telephone or Video please confirm:: I connected with patient using audio/video enable telemedicine. I verified patient identity with two identifiers, discussed telehealth limitations, and patient agreed to proceed. Patient Location:: home Provider Location:: office Interpreter Needed?: No Pre-visit prep was completed: yes AWV questionnaire completed by patient prior to visit?: no Living arrangements:: with family/others Patient's Overall Health Status Rating: good Typical amount of pain: none Does pain affect daily life?: no Are you currently prescribed opioids?: no  Dietary Habits and Nutritional Risks How many meals a day?: 2 Eats fruit and vegetables daily?: yes Most meals are obtained by: preparing own meals In the last 2 weeks, have you had any of the following?: none Diabetic:: (!) yes Any non-healing wounds?: no How often do you check your BS?: continuous glucose monitor Would you like to be referred to a Nutritionist or for Diabetic Management? : no  Functional Status Activities of Daily Living (to include ambulation/medication): Independent Ambulation: Independent Medication Administration: Independent Home Management (perform basic housework or laundry): Independent Manage your own finances?: yes Primary transportation is: driving Concerns about vision?: no *vision screening is required for  WTM* Concerns about hearing?: no  Fall Screening Falls in the past year?: 0 Number of falls in past year: 0 Was there an injury with Fall?: 0 Fall Risk Category Calculator: 0 Patient Fall Risk Level: Low Fall Risk  Fall Risk Patient at Risk for Falls Due to: Medication side effect Fall risk Follow up: Falls prevention discussed; Education provided; Falls evaluation completed  Home and Transportation Safety: All rugs have non-skid backing?: N/A, no rugs All stairs or steps have railings?: N/A, no stairs Grab bars in the bathtub or shower?: (!) no Have non-skid surface in bathtub or shower?: (!) no Good home lighting?: yes Regular seat belt use?: yes Hospital stays in the last year:: no  Cognitive Assessment Difficulty concentrating, remembering, or making decisions? : no Will 6CIT or Mini Cog be Completed: yes What year is it?: 0 points What month is it?: 0 points Give patient an address phrase to remember (5 components): [address] [place] About what time is it?: 0 points Count backwards from 20 to 1: 0 points Say the months of the year in reverse: 0 points Repeat the address phrase from earlier: 2 points 6 CIT Score: 2 points  Advance Directives (For Healthcare) Does Patient Have a Medical Advance Directive?: No Would patient like information on creating a medical advance directive?: No - Patient declined  Reviewed/Updated  Reviewed/Updated: Reviewed All (Medical, Surgical, Family, Medications, Allergies, Care Teams, Patient Goals)    Allergies (verified) Patient has no known allergies.   Current Medications (verified) Outpatient Encounter Medications as of 06/13/2024  Medication Sig   aspirin  EC 81 MG tablet Take 1 tablet (81 mg total) by mouth daily. Swallow whole.   atorvastatin  (LIPITOR) 80 MG tablet Take 80 mg by mouth every evening.   azaTHIOprine (IMURAN) 50 MG tablet Take 50 mg by mouth in  the morning.   Azathioprine 75 MG TABS Take 75 mg by mouth in  the morning.   azelastine  (OPTIVAR ) 0.05 % ophthalmic solution Place 1 drop into both eyes daily as needed (stye treatment).   carvedilol (COREG) 6.25 MG tablet Take 6.25 mg by mouth in the morning and at bedtime.   Continuous Glucose Receiver (DEXCOM G7 RECEIVER) DEVI    insulin  glargine (LANTUS  SOLOSTAR) 100 UNIT/ML Solostar Pen Inject 15 units sq nightly (Patient taking differently: Inject 15 Units into the skin 2 (two) times daily.)   insulin  lispro (HUMALOG  KWIKPEN) 100 UNIT/ML KwikPen Per sliding scale through [facility] transplant team   lidocaine  (XYLOCAINE ) 5 % ointment Apply 1 Application topically as needed.   NIFEdipine  (ADALAT  CC) 90 MG 24 hr tablet Take 1 tablet (90 mg total) by mouth daily.   predniSONE  (DELTASONE ) 5 MG tablet Take 5 mg by mouth in the morning.   sulfamethoxazole-trimethoprim (BACTRIM) 400-80 MG tablet Take 1 tablet by mouth every Monday, Wednesday, and Friday. In the morning.   tacrolimus  (PROGRAF ) 1 MG capsule Take 2 mg by mouth in the morning and at bedtime.   torsemide (DEMADEX) 20 MG tablet Take 20 mg by mouth in the morning.   imiquimod  (ALDARA ) 5 % cream Apply topically 3 (three) times a week. (Patient not taking: Reported on 06/13/2024)   MOUNJARO  12.5 MG/0.5ML Pen Inject 12.5 mg into the skin once a week. (Patient not taking: Reported on 06/13/2024)   ondansetron  (ZOFRAN ) 4 MG tablet Take 1 tablet (4 mg total) by mouth every 8 (eight) hours as needed for nausea or vomiting. (Patient not taking: Reported on 06/13/2024)   oxyCODONE  (OXY IR/ROXICODONE ) 5 MG immediate release tablet Take 1 tablet (5 mg total) by mouth every 12 (twelve) hours as needed for severe pain (pain score 7-10). For AFTER surgery only, do not take and drive (Patient not taking: Reported on 06/13/2024)   senna-docusate (SENOKOT-S) 8.6-50 MG tablet Take 2 tablets by mouth at bedtime. For AFTER surgery, do not take if having diarrhea (Patient not taking: Reported on 06/13/2024)   silver  sulfADIAZINE   (SILVADENE ) 1 % cream Apply 1 Application topically 2 (two) times daily. Apply to the lasered area on the vulva (Patient not taking: Reported on 06/13/2024)   No facility-administered encounter medications on file as of 06/13/2024.    History: Past Medical History:  Diagnosis Date   Anemia in chronic kidney disease (CKD)    Chronic kidney disease, stage V (HCC)    s/p kidney transplant 03/28/2020   Diabetic retinopathy (HCC)    Heart murmur    has ECHO scheduled for 05-18-24   Hx of goiter 2009   had thyroidectomy  on no medication for replacement   Hypertension    Kidney transplanted    Uncontrolled diabetes mellitus with microalbuminuria or microproteinuria    Past Surgical History:  Procedure Laterality Date   CARDIAC CATHETERIZATION N/A 03/23/2016   Procedure: Left Heart Cath and Coronary Angiography;  Surgeon: Gordy Bergamo, MD;  Location: [facility] INVASIVE CV LAB;  Service: Cardiovascular;  Laterality: N/A;   CATARACT EXTRACTION W/ INTRAOCULAR LENS IMPLANT Bilateral    CERVICAL CONIZATION W/BX N/A 11/29/2023   Procedure: CONE BIOPSY, CERVIX;  Surgeon: Lilton Legions, DO;  Location: [facility] OR;  Service: Gynecology;  Laterality: N/A;   CESAREAN SECTION  2005   CESAREAN SECTION  1993   DILITATION & CURRETTAGE/HYSTROSCOPY WITH NOVASURE ABLATION N/A 10/24/2014   Procedure: DILATATION & CURETTAGE/HYSTEROSCOPY WITH NOVASURE ABLATION;  Surgeon: Gigi Botts, MD;  Location: [facility] ORS;  Service: Gynecology;  Laterality: N/A;   KIDNEY TRANSPLANT  03/28/2020   [facility]   LEEP N/A 09/02/2023   Procedure: LEEP (LOOP ELECTROSURGICAL EXCISION PROCEDURE);  Surgeon: Lilton Legions, DO;  Location: [facility] OR;  Service: Gynecology;  Laterality: N/A;   LESION DESTRUCTION N/A 04/24/2024   Procedure: CO2 LASER ABLATION OF THE VULVA;  Surgeon: Eldonna Mays, MD;  Location: [facility] ORS;  Service: Gynecology;  Laterality: N/A;  possible co2 laser of vulva   ROBOTIC ASSISTED TOTAL HYSTERECTOMY WITH BILATERAL SALPINGO  OOPHERECTOMY Bilateral 04/24/2024   Procedure: ROBOTIC ASSISTED TOTAL LAPAROSCOPIC HYSTERECTOMY, BILATERAL SALPINGECTOMY;  Surgeon: Eldonna Mays, MD;  Location: [facility] ORS;  Service: Gynecology;  Laterality: Bilateral;  with BSO   THYROIDECTOMY  2009   partial   TUBAL LIGATION  2005   Family History  Problem Relation Age of Onset   Hypertension Mother    Diabetes Mother    Kidney disease Mother    Crohn's disease Mother    Diabetes Father    Hypertension Father    Ulcers Father    Kidney disease Father    Breast cancer Neg Hx    Ovarian cancer Neg Hx    Endometrial cancer Neg Hx    Colon cancer Neg Hx    Social History   Occupational History   Not on file  Tobacco Use   Smoking status: Never   Smokeless tobacco: Never  Vaping Use   Vaping status: Never Used  Substance and Sexual Activity   Alcohol use: No   Drug use: No   Sexual activity: Not Currently    Birth control/protection: Other-see comments    Comment: Ablation   Tobacco Counseling Counseling given: Not Answered  SDOH Screenings   Food Insecurity: No Food Insecurity (06/13/2024)  Housing: Low Risk (06/13/2024)  Transportation Needs: No Transportation Needs (06/13/2024)  Utilities: Not At Risk (06/13/2024)  Alcohol Screen: Low Risk (06/13/2024)  Depression (PHQ2-9): Low Risk (06/13/2024)  Financial Resource Strain: Low Risk (06/13/2024)  Physical Activity: Inactive (06/13/2024)  Social Connections: Socially Isolated (06/13/2024)  Stress: No Stress Concern Present (06/13/2024)  Tobacco Use: Low Risk (06/13/2024)  Health Literacy: Adequate Health Literacy (06/13/2024)   See flowsheets for full screening details  Depression Screen PHQ 2 & 9 Depression Scale- Over the past 2 weeks, how often have you been bothered by any of the following problems? Little interest or pleasure in doing things: 0 Feeling down, depressed, or hopeless (PHQ Adolescent also includes...irritable): 0 PHQ-2 Total Score: 0 Trouble falling or staying  asleep, or sleeping too much: 0 Feeling tired or having little energy: 0 Poor appetite or overeating (PHQ Adolescent also includes...weight loss): 0 Feeling bad about yourself - or that you are a failure or have let yourself or your family down: 0 Trouble concentrating on things, such as reading the newspaper or watching television (PHQ Adolescent also includes...like school work): 0 Moving or speaking so slowly that other people could have noticed. Or the opposite - being so fidgety or restless that you have been moving around a lot more than usual: 0 Thoughts that you would be better off dead, or of hurting yourself in some way: 0 PHQ-9 Total Score: 0 If you checked off any problems, how difficult have these problems made it for you to do your work, take care of things at home, or get along with other people?: Not difficult at all  Depression Treatment Depression Interventions/Treatment : EYV7-0 Score <4 Follow-up Not Indicated  Goals Addressed               This Visit's Progress     contolling BP and blood sugars, lose weight (pt-stated)               Objective:    Today's Vitals   06/13/24 1602  Weight: 230 lb (104.3 kg)  Height: 5' 7.75 (1.721 m)   Body mass index is 35.23 kg/m.  Hearing/Vision screen Vision Screening - Comments:: Regular eye exams Immunizations and Health Maintenance Health Maintenance  Topic Date Due   Hepatitis B Vaccines 19-59 Average Risk (2 of 2 - CpG 2-dose series) 02/26/2020   COVID-19 Vaccine (5 - 2025-26 season) 01/09/2024   Mammogram  02/15/2024   HEMOGLOBIN A1C  04/08/2024   Diabetic kidney evaluation - Urine ACR  06/22/2024   DTaP/Tdap/Td (2 - Td or Tdap) 07/19/2024   FOOT EXAM  12/20/2024   Diabetic kidney evaluation - eGFR measurement  04/19/2025   OPHTHALMOLOGY EXAM  05/23/2025   Medicare Annual Wellness (AWV)  06/13/2025   Pneumococcal Vaccine: 50+ Years (3 of 3 - PCV20 or PCV21) 05/21/2026   Colonoscopy  01/23/2032    Influenza Vaccine  Completed   HPV VACCINES (No Doses Required) Completed   Hepatitis C Screening  Completed   HIV Screening  Completed   Zoster Vaccines- Shingrix  Completed   Meningococcal B Vaccine  Aged Out        Assessment/Plan:  This is a routine wellness examination for Yaretzy.  Patient Care Team: Jarold Medici, MD as PCP - General (Internal Medicine) Floretta Mallard, MD as PCP - Cardiology (Cardiology) Marlee Bernardino NOVAK, MD as Attending Physician (Nephrology) Lilton Legions, DO as Consulting Physician (Obstetrics and Gynecology) Jarold Mayo, MD as Consulting Physician (Ophthalmology) Eldonna Mays, MD as Consulting Physician (Gynecologic Oncology)  I have personally reviewed and noted the following in the patients chart:   Medical and social history Use of alcohol, tobacco or illicit drugs  Current medications and supplements including opioid prescriptions. Functional ability and status Nutritional status Physical activity Advanced directives List of other physicians Hospitalizations, surgeries, and ER visits in previous 12 months Vitals Screenings to include cognitive, depression, and falls Referrals and appointments  No orders of the defined types were placed in this encounter.  In addition, I have reviewed and discussed with patient certain preventive protocols, quality metrics, and best practice recommendations. A written personalized care plan for preventive services as well as general preventive health recommendations were provided to patient.   Ardella FORBES Dawn, LPN   11/12/7971   Return in 1 year (on 06/13/2025).  After Visit Summary: (MyChart) Due to this being a telephonic visit, the after visit summary with patients personalized plan was offered to patient via MyChart   Nurse Notes: HM Addressed: Vaccines Due: covid Labs Due A1C Patient states that she will call to schedule her mammogram  "

## 2024-06-14 ENCOUNTER — Ambulatory Visit: Payer: Self-pay | Admitting: Internal Medicine

## 2024-06-18 ENCOUNTER — Ambulatory Visit (HOSPITAL_COMMUNITY)

## 2024-07-03 ENCOUNTER — Ambulatory Visit: Admitting: Student in an Organized Health Care Education/Training Program

## 2024-07-30 ENCOUNTER — Inpatient Hospital Stay: Admitting: Psychiatry

## 2024-12-24 ENCOUNTER — Encounter: Payer: Self-pay | Admitting: Internal Medicine

## 2025-06-19 ENCOUNTER — Ambulatory Visit: Payer: Self-pay
